# Patient Record
Sex: Male | Born: 1980 | Race: Black or African American | Hispanic: No | Marital: Single | Smoking: Never smoker
Health system: Southern US, Community
[De-identification: ages and names within clinical notes are randomized; demographics above are authoritative.]

## PROBLEM LIST (undated history)

## (undated) DIAGNOSIS — M5126 Other intervertebral disc displacement, lumbar region: Secondary | ICD-10-CM

## (undated) DIAGNOSIS — M170 Bilateral primary osteoarthritis of knee: Secondary | ICD-10-CM

## (undated) DIAGNOSIS — R7303 Prediabetes: Secondary | ICD-10-CM

## (undated) DIAGNOSIS — F32A Depression, unspecified: Secondary | ICD-10-CM

## (undated) DIAGNOSIS — Z91018 Allergy to other foods: Secondary | ICD-10-CM

## (undated) DIAGNOSIS — E669 Obesity, unspecified: Secondary | ICD-10-CM

## (undated) DIAGNOSIS — G4733 Obstructive sleep apnea (adult) (pediatric): Secondary | ICD-10-CM

## (undated) DIAGNOSIS — R5383 Other fatigue: Secondary | ICD-10-CM

## (undated) DIAGNOSIS — I10 Essential (primary) hypertension: Secondary | ICD-10-CM

## (undated) DIAGNOSIS — R0602 Shortness of breath: Secondary | ICD-10-CM

## (undated) DIAGNOSIS — M2550 Pain in unspecified joint: Secondary | ICD-10-CM

## (undated) DIAGNOSIS — M545 Low back pain, unspecified: Secondary | ICD-10-CM

## (undated) DIAGNOSIS — I319 Disease of pericardium, unspecified: Secondary | ICD-10-CM

## (undated) DIAGNOSIS — R079 Chest pain, unspecified: Secondary | ICD-10-CM

## (undated) DIAGNOSIS — M549 Dorsalgia, unspecified: Secondary | ICD-10-CM

## (undated) DIAGNOSIS — K5900 Constipation, unspecified: Secondary | ICD-10-CM

## (undated) HISTORY — DX: Prediabetes: R73.03

## (undated) HISTORY — DX: Depression, unspecified: F32.A

## (undated) HISTORY — DX: Bilateral primary osteoarthritis of knee: M17.0

## (undated) HISTORY — DX: Other intervertebral disc displacement, lumbar region: M51.26

## (undated) HISTORY — DX: Chest pain, unspecified: R07.9

## (undated) HISTORY — DX: Constipation, unspecified: K59.00

## (undated) HISTORY — PX: OTHER SURGICAL HISTORY: SHX169

## (undated) HISTORY — PX: KNEE SURGERY: SHX244

## (undated) HISTORY — DX: Pain in unspecified joint: M25.50

## (undated) HISTORY — DX: Other fatigue: R53.83

## (undated) HISTORY — DX: Allergy to other foods: Z91.018

## (undated) HISTORY — DX: Essential (primary) hypertension: I10

## (undated) HISTORY — DX: Shortness of breath: R06.02

## (undated) HISTORY — DX: Dorsalgia, unspecified: M54.9

---
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
## 1999-01-18 ENCOUNTER — Encounter: Payer: Self-pay | Admitting: Emergency Medicine

## 1999-01-18 ENCOUNTER — Emergency Department (HOSPITAL_COMMUNITY): Admission: EM | Admit: 1999-01-18 | Discharge: 1999-01-18 | Payer: Self-pay | Admitting: Emergency Medicine

## 2000-09-22 ENCOUNTER — Encounter: Admission: RE | Admit: 2000-09-22 | Discharge: 2000-09-22 | Payer: Self-pay | Admitting: Orthopedic Surgery

## 2000-09-22 ENCOUNTER — Encounter: Payer: Self-pay | Admitting: Orthopedic Surgery

## 2002-08-01 ENCOUNTER — Emergency Department (HOSPITAL_COMMUNITY): Admission: EM | Admit: 2002-08-01 | Discharge: 2002-08-01 | Payer: Self-pay | Admitting: *Deleted

## 2002-10-03 ENCOUNTER — Emergency Department (HOSPITAL_COMMUNITY): Admission: EM | Admit: 2002-10-03 | Discharge: 2002-10-03 | Payer: Self-pay | Admitting: Emergency Medicine

## 2004-01-26 ENCOUNTER — Emergency Department (HOSPITAL_COMMUNITY): Admission: EM | Admit: 2004-01-26 | Discharge: 2004-01-26 | Payer: Self-pay | Admitting: Family Medicine

## 2004-06-21 ENCOUNTER — Ambulatory Visit: Payer: Self-pay | Admitting: Internal Medicine

## 2004-07-22 ENCOUNTER — Ambulatory Visit: Payer: Self-pay | Admitting: Internal Medicine

## 2004-07-29 ENCOUNTER — Encounter: Admission: RE | Admit: 2004-07-29 | Discharge: 2004-08-31 | Payer: Self-pay | Admitting: Internal Medicine

## 2004-09-08 ENCOUNTER — Ambulatory Visit: Payer: Self-pay | Admitting: Internal Medicine

## 2004-11-04 ENCOUNTER — Ambulatory Visit (HOSPITAL_COMMUNITY): Admission: RE | Admit: 2004-11-04 | Discharge: 2004-11-04 | Payer: Self-pay | Admitting: Internal Medicine

## 2004-11-04 ENCOUNTER — Ambulatory Visit: Payer: Self-pay | Admitting: Internal Medicine

## 2004-11-11 ENCOUNTER — Ambulatory Visit: Payer: Self-pay | Admitting: Internal Medicine

## 2005-01-28 ENCOUNTER — Emergency Department (HOSPITAL_COMMUNITY): Admission: EM | Admit: 2005-01-28 | Discharge: 2005-01-28 | Payer: Self-pay | Admitting: Emergency Medicine

## 2005-07-14 ENCOUNTER — Encounter: Admission: RE | Admit: 2005-07-14 | Discharge: 2005-07-14 | Payer: Self-pay | Admitting: Occupational Medicine

## 2005-10-09 ENCOUNTER — Emergency Department (HOSPITAL_COMMUNITY): Admission: EM | Admit: 2005-10-09 | Discharge: 2005-10-09 | Payer: Self-pay | Admitting: Emergency Medicine

## 2006-01-02 ENCOUNTER — Emergency Department (HOSPITAL_COMMUNITY): Admission: EM | Admit: 2006-01-02 | Discharge: 2006-01-02 | Payer: Self-pay | Admitting: Family Medicine

## 2006-01-03 ENCOUNTER — Encounter: Admission: RE | Admit: 2006-01-03 | Discharge: 2006-01-03 | Payer: Self-pay | Admitting: Family Medicine

## 2006-01-11 ENCOUNTER — Emergency Department (HOSPITAL_COMMUNITY): Admission: EM | Admit: 2006-01-11 | Discharge: 2006-01-11 | Payer: Self-pay | Admitting: Emergency Medicine

## 2006-02-07 ENCOUNTER — Encounter: Admission: RE | Admit: 2006-02-07 | Discharge: 2006-04-10 | Payer: Self-pay | Admitting: Neurosurgery

## 2006-03-07 ENCOUNTER — Encounter: Admission: RE | Admit: 2006-03-07 | Discharge: 2006-03-07 | Payer: Self-pay | Admitting: Family Medicine

## 2006-05-19 ENCOUNTER — Emergency Department (HOSPITAL_COMMUNITY): Admission: EM | Admit: 2006-05-19 | Discharge: 2006-05-19 | Payer: Self-pay | Admitting: Emergency Medicine

## 2006-06-22 ENCOUNTER — Ambulatory Visit (HOSPITAL_COMMUNITY): Admission: RE | Admit: 2006-06-22 | Discharge: 2006-06-22 | Payer: Self-pay | Admitting: Neurosurgery

## 2007-02-26 ENCOUNTER — Emergency Department (HOSPITAL_COMMUNITY): Admission: EM | Admit: 2007-02-26 | Discharge: 2007-02-26 | Payer: Self-pay | Admitting: Family Medicine

## 2007-10-15 ENCOUNTER — Emergency Department (HOSPITAL_COMMUNITY): Admission: EM | Admit: 2007-10-15 | Discharge: 2007-10-15 | Payer: Self-pay | Admitting: Family Medicine

## 2008-02-12 ENCOUNTER — Emergency Department (HOSPITAL_COMMUNITY): Admission: EM | Admit: 2008-02-12 | Discharge: 2008-02-12 | Payer: Self-pay | Admitting: Emergency Medicine

## 2008-05-09 ENCOUNTER — Encounter: Admission: RE | Admit: 2008-05-09 | Discharge: 2008-05-09 | Payer: Self-pay | Admitting: Family Medicine

## 2008-05-16 ENCOUNTER — Emergency Department (HOSPITAL_COMMUNITY): Admission: EM | Admit: 2008-05-16 | Discharge: 2008-05-16 | Payer: Self-pay | Admitting: Emergency Medicine

## 2008-08-22 ENCOUNTER — Emergency Department (HOSPITAL_COMMUNITY): Admission: EM | Admit: 2008-08-22 | Discharge: 2008-08-22 | Payer: Self-pay | Admitting: Emergency Medicine

## 2008-09-16 ENCOUNTER — Encounter: Admission: RE | Admit: 2008-09-16 | Discharge: 2008-09-16 | Payer: Self-pay | Admitting: Orthopaedic Surgery

## 2009-07-01 ENCOUNTER — Emergency Department (HOSPITAL_COMMUNITY): Admission: EM | Admit: 2009-07-01 | Discharge: 2009-07-01 | Payer: Self-pay | Admitting: Family Medicine

## 2009-09-06 ENCOUNTER — Emergency Department (HOSPITAL_COMMUNITY): Admission: EM | Admit: 2009-09-06 | Discharge: 2009-09-06 | Payer: Self-pay | Admitting: Emergency Medicine

## 2009-09-23 ENCOUNTER — Encounter: Payer: Self-pay | Admitting: Cardiovascular Disease

## 2009-11-24 ENCOUNTER — Emergency Department (HOSPITAL_COMMUNITY): Admission: EM | Admit: 2009-11-24 | Discharge: 2009-11-24 | Payer: Self-pay | Admitting: Family Medicine

## 2010-07-22 ENCOUNTER — Encounter: Admission: RE | Admit: 2010-07-22 | Discharge: 2010-07-22 | Payer: Self-pay | Admitting: Orthopaedic Surgery

## 2010-10-25 ENCOUNTER — Encounter: Payer: Self-pay | Admitting: Family Medicine

## 2011-01-04 LAB — POCT CARDIAC MARKERS
CKMB, poc: 2.2 ng/mL (ref 1.0–8.0)
Myoglobin, poc: 106 ng/mL (ref 12–200)
Troponin i, poc: <0.05 ng/mL (ref 0.00–0.09)

## 2011-01-04 LAB — POCT I-STAT, CHEM 8
BUN: 8 mg/dL (ref 6–23)
Calcium, Ion: 1.22 mmol/L (ref 1.12–1.32)
Chloride: 103 mEq/L (ref 96–112)
Creatinine, Ser: 0.8 mg/dL (ref 0.4–1.5)
Glucose, Bld: 90 mg/dL (ref 70–99)
Hemoglobin: 14.6 g/dL (ref 13.0–17.0)
Potassium: 3.9 mEq/L (ref 3.5–5.1)
Sodium: 141 mEq/L (ref 135–145)

## 2011-01-04 LAB — CBC
HCT: 42 % (ref 39.0–52.0)
Hemoglobin: 13.9 g/dL (ref 13.0–17.0)
MCHC: 33.1 g/dL (ref 30.0–36.0)
MCV: 89.5 fL (ref 78.0–100.0)
RBC: 4.69 MIL/uL (ref 4.22–5.81)
RDW: 13.4 % (ref 11.5–15.5)
WBC: 5.6 10*3/uL (ref 4.0–10.5)

## 2011-01-04 LAB — DIFFERENTIAL
Monocytes Relative: 11 % (ref 3–12)
Neutrophils Relative %: 47 % (ref 43–77)

## 2011-02-25 ENCOUNTER — Inpatient Hospital Stay (INDEPENDENT_AMBULATORY_CARE_PROVIDER_SITE_OTHER)
Admission: RE | Admit: 2011-02-25 | Discharge: 2011-02-25 | Disposition: A | Discharge status: Home / Self Care | Payer: 59 | Source: Ambulatory Visit | Attending: Emergency Medicine | Admitting: Emergency Medicine

## 2011-02-25 DIAGNOSIS — I10 Essential (primary) hypertension: Secondary | ICD-10-CM

## 2011-02-25 DIAGNOSIS — K1120 Sialoadenitis, unspecified: Secondary | ICD-10-CM

## 2011-03-21 ENCOUNTER — Inpatient Hospital Stay (INDEPENDENT_AMBULATORY_CARE_PROVIDER_SITE_OTHER)
Admission: RE | Admit: 2011-03-21 | Discharge: 2011-03-21 | Disposition: A | Discharge status: Home / Self Care | Payer: 59 | Source: Ambulatory Visit | Attending: Family Medicine | Admitting: Family Medicine

## 2011-03-21 DIAGNOSIS — J069 Acute upper respiratory infection, unspecified: Secondary | ICD-10-CM

## 2012-01-24 ENCOUNTER — Encounter: Payer: Self-pay | Admitting: *Deleted

## 2013-05-06 ENCOUNTER — Encounter: Payer: Self-pay | Admitting: *Deleted

## 2013-09-18 ENCOUNTER — Emergency Department (HOSPITAL_COMMUNITY)
Admission: EM | Admit: 2013-09-18 | Discharge: 2013-09-18 | Disposition: A | Discharge status: Home / Self Care | Payer: 59 | Source: Home / Self Care | Attending: Family Medicine | Admitting: Family Medicine

## 2013-09-18 ENCOUNTER — Encounter (HOSPITAL_COMMUNITY): Payer: Self-pay | Admitting: Emergency Medicine

## 2013-09-18 DIAGNOSIS — J111 Influenza due to unidentified influenza virus with other respiratory manifestations: Secondary | ICD-10-CM

## 2013-09-18 DIAGNOSIS — R030 Elevated blood-pressure reading, without diagnosis of hypertension: Secondary | ICD-10-CM

## 2013-09-18 MED ORDER — BENZONATATE 100 MG PO CAPS: 100.0000 mg | ORAL_CAPSULE | Freq: Three times a day (TID) | ORAL | Status: DC | PRN

## 2013-09-18 NOTE — ED Notes (Signed)
C/o nonproductive cough since Saturday States he does have fever, chills, and congestion States ibuprofen and promethazine was taken but no relief.

## 2013-09-18 NOTE — ED Provider Notes (Signed)
CSN: [id number]     Arrival date & time 09/18/13  1848 History   First MD Initiated Contact with Patient 09/18/13 2000     Chief Complaint  Patient presents with  . Cough   (Consider location/radiation/quality/duration/timing/severity/associated sxs/prior Treatment) HPI Comments: Patient states his symptoms developed rapidly on 09/15/2013 and he learned 2 days later that his co-worker had been diagnosed with influenza. Today, his main concern is his persistent cough.   Patient is a 32 y.o. male presenting with URI. The history is provided by the patient.  URI Presenting symptoms: congestion, cough, fatigue, fever and rhinorrhea   Severity:  Moderate Onset quality:  Sudden Duration:  6 days Progression:  Improving Chronicity:  New Associated symptoms: myalgias   Associated symptoms: no arthralgias, no headaches, no neck pain, no sinus pain, no sneezing, no swollen glands and no wheezing     Past Medical History  Diagnosis Date  . Hypertension    Past Surgical History  Procedure Laterality Date  . None     Family History  Problem Relation Age of Onset  . Hypertension    . Diabetes     History  Substance Use Topics  . Smoking status: Never Smoker   . Smokeless tobacco: Not on file  . Alcohol Use: No    Review of Systems  Constitutional: Positive for fever and fatigue.  HENT: Positive for congestion and rhinorrhea. Negative for sneezing.   Respiratory: Positive for cough. Negative for wheezing.   Cardiovascular: Negative.   Gastrointestinal: Negative.   Genitourinary: Negative.   Musculoskeletal: Positive for myalgias. Negative for arthralgias and neck pain.  Skin: Negative for rash.  Allergic/Immunologic: Negative for immunocompromised state.  Neurological: Negative for headaches.  Hematological: Negative for adenopathy.  Psychiatric/Behavioral: Negative.     Allergies  Ibuprofen  Home Medications   Current Outpatient Rx  Name  Route  Sig  Dispense  Refill   . glucosamine-chondroitin 500-400 MG tablet   Oral   Take 1 tablet by mouth 3 (three) times daily.         . Misc Natural Products (GINSENG COMPLEX PO)   Oral   Take by mouth daily.         . Multiple Vitamin (MULTIVITAMIN) tablet   Oral   Take 1 tablet by mouth daily.         Marland Kitchen omeprazole (PRILOSEC) 20 MG capsule   Oral   Take 20 mg by mouth daily.          BP 151/99  Pulse 67  Temp(Src) 97 F (36.1 C) (Oral)  Resp 20  SpO2 99% Physical Exam  Nursing note and vitals reviewed. Constitutional: He is oriented to person, place, and time. He appears well-developed and well-nourished. No distress.  HENT:  Head: Normocephalic and atraumatic.  Right Ear: Hearing, tympanic membrane, external ear and ear canal normal.  Left Ear: Hearing, tympanic membrane, external ear and ear canal normal.  Nose: Nose normal.  Mouth/Throat: Oropharynx is clear and moist.  Eyes: Conjunctivae are normal. Right eye exhibits no discharge. Left eye exhibits no discharge. No scleral icterus.  Neck: Normal range of motion. Neck supple.  Cardiovascular: Normal rate, regular rhythm and normal heart sounds.   Pulmonary/Chest: Effort normal and breath sounds normal. No respiratory distress. He has no wheezes. He has no rales. He exhibits no tenderness.  Abdominal: Soft. There is no tenderness.  Musculoskeletal: Normal range of motion.  Lymphadenopathy:    He has no cervical adenopathy.  Neurological: He is  alert and oriented to person, place, and time.  Skin: Skin is warm and dry. No rash noted.  Psychiatric: He has a normal mood and affect. His behavior is normal.    ED Course  Procedures (including critical care time) Labs Review Labs Reviewed - No data to display Imaging Review No results found.  EKG Interpretation    Date/Time:    Ventricular Rate:    PR Interval:    QRS Duration:   QT Interval:    QTC Calculation:   R Axis:     Text Interpretation:              MDM   Nathaniel Jones was made aware of his elevated blood pressure and I recommended that he make an appointment with his PCP for re-check in 7-10 days. History of current illness suggests that he is recovering from influenza, however patient is past the point in time from when symptoms began to take tamiflu. We also discussed symptomatic care of his URI symptoms at home, trying to avoid oral decongestants that may further elevate his blood pressure. Tessalon prescribed for cough.     [name] [place], [place] 09/19/13 [phone]

## 2013-09-19 NOTE — ED Provider Notes (Signed)
Medical screening examination/treatment/procedure(s) were performed by resident physician or non-physician practitioner and as supervising physician I was immediately available for consultation/collaboration.   Keli Buehner DOUGLAS MD.   Rahshawn Remo D Ferris Fielden, MD 09/19/13 1745 

## 2013-12-09 ENCOUNTER — Other Ambulatory Visit: Payer: Self-pay | Admitting: Family Medicine

## 2013-12-09 DIAGNOSIS — R1084 Generalized abdominal pain: Secondary | ICD-10-CM

## 2013-12-12 ENCOUNTER — Ambulatory Visit
Admission: RE | Admit: 2013-12-12 | Discharge: 2013-12-12 | Disposition: A | Discharge status: Home / Self Care | Payer: 59 | Source: Ambulatory Visit | Attending: Family Medicine | Admitting: Family Medicine

## 2013-12-12 DIAGNOSIS — R1084 Generalized abdominal pain: Secondary | ICD-10-CM

## 2013-12-12 MED ORDER — IOHEXOL 300 MG/ML  SOLN
125.0000 mL | Freq: Once | INTRAMUSCULAR | Status: AC | PRN
  Administered 2013-12-12: 125 mL via INTRAVENOUS

## 2014-01-31 ENCOUNTER — Other Ambulatory Visit: Payer: Self-pay | Admitting: Physician Assistant

## 2014-01-31 DIAGNOSIS — M25511 Pain in right shoulder: Secondary | ICD-10-CM

## 2014-02-10 ENCOUNTER — Ambulatory Visit
Admission: RE | Admit: 2014-02-10 | Discharge: 2014-02-10 | Disposition: A | Discharge status: Home / Self Care | Payer: 59 | Source: Ambulatory Visit | Attending: Physician Assistant | Admitting: Physician Assistant

## 2014-02-10 DIAGNOSIS — M25511 Pain in right shoulder: Secondary | ICD-10-CM

## 2014-02-27 ENCOUNTER — Other Ambulatory Visit: Payer: Self-pay | Admitting: Orthopaedic Surgery

## 2014-02-27 DIAGNOSIS — M25562 Pain in left knee: Secondary | ICD-10-CM

## 2014-03-08 ENCOUNTER — Ambulatory Visit
Admission: RE | Admit: 2014-03-08 | Discharge: 2014-03-08 | Disposition: A | Discharge status: Home / Self Care | Payer: 59 | Source: Ambulatory Visit | Attending: Orthopaedic Surgery | Admitting: Orthopaedic Surgery

## 2014-03-08 DIAGNOSIS — M25562 Pain in left knee: Secondary | ICD-10-CM

## 2014-03-27 ENCOUNTER — Encounter (INDEPENDENT_AMBULATORY_CARE_PROVIDER_SITE_OTHER): Payer: Self-pay

## 2014-03-27 ENCOUNTER — Ambulatory Visit
Admission: RE | Admit: 2014-03-27 | Discharge: 2014-03-27 | Disposition: A | Discharge status: Home / Self Care | Payer: 59 | Source: Ambulatory Visit | Attending: Family Medicine | Admitting: Family Medicine

## 2014-03-27 ENCOUNTER — Other Ambulatory Visit: Payer: Self-pay | Admitting: Family Medicine

## 2014-03-27 DIAGNOSIS — M542 Cervicalgia: Secondary | ICD-10-CM

## 2014-04-14 ENCOUNTER — Other Ambulatory Visit: Payer: Self-pay | Admitting: Orthopaedic Surgery

## 2014-04-14 DIAGNOSIS — M25562 Pain in left knee: Secondary | ICD-10-CM

## 2014-04-19 ENCOUNTER — Ambulatory Visit
Admission: RE | Admit: 2014-04-19 | Discharge: 2014-04-19 | Disposition: A | Discharge status: Home / Self Care | Payer: 59 | Source: Ambulatory Visit | Attending: Orthopaedic Surgery | Admitting: Orthopaedic Surgery

## 2014-04-19 DIAGNOSIS — M25562 Pain in left knee: Secondary | ICD-10-CM

## 2014-10-03 HISTORY — PX: KNEE SURGERY: SHX244

## 2014-11-20 ENCOUNTER — Other Ambulatory Visit: Payer: Self-pay | Admitting: Orthopaedic Surgery

## 2014-11-20 DIAGNOSIS — M25572 Pain in left ankle and joints of left foot: Secondary | ICD-10-CM

## 2014-12-05 ENCOUNTER — Ambulatory Visit
Admission: RE | Admit: 2014-12-05 | Discharge: 2014-12-05 | Disposition: A | Discharge status: Home / Self Care | Payer: Self-pay | Source: Ambulatory Visit | Attending: Orthopaedic Surgery | Admitting: Orthopaedic Surgery

## 2014-12-05 DIAGNOSIS — M25572 Pain in left ankle and joints of left foot: Secondary | ICD-10-CM

## 2015-02-08 ENCOUNTER — Emergency Department (HOSPITAL_COMMUNITY)
Admission: EM | Admit: 2015-02-08 | Discharge: 2015-02-08 | Disposition: A | Discharge status: Home / Self Care | Payer: 59 | Source: Home / Self Care | Attending: Family Medicine | Admitting: Family Medicine

## 2015-02-08 ENCOUNTER — Encounter (HOSPITAL_COMMUNITY): Payer: Self-pay

## 2015-02-08 DIAGNOSIS — S76311A Strain of muscle, fascia and tendon of the posterior muscle group at thigh level, right thigh, initial encounter: Secondary | ICD-10-CM | POA: Diagnosis not present

## 2015-02-08 MED ORDER — TRAMADOL HCL 50 MG PO TABS: 50.0000 mg | ORAL_TABLET | Freq: Four times a day (QID) | ORAL | Status: DC | PRN

## 2015-02-08 NOTE — ED Notes (Signed)
Had sudden painin knee while working out, and since has not been able to have full ROM same

## 2015-02-08 NOTE — Discharge Instructions (Signed)
Heat, activity as tolerated and see your orthopedsit as possible

## 2015-02-08 NOTE — ED Provider Notes (Signed)
CSN: [id number]     Arrival date & time 02/08/15  1846 History   First MD Initiated Contact with Patient 02/08/15 1852     No chief complaint on file.  (Consider location/radiation/quality/duration/timing/severity/associated sxs/prior Treatment) Patient is a 34 y.o. male presenting with leg pain. The history is provided by the patient.  Leg Pain Location:  Knee Time since incident:  1 week Injury: yes   Mechanism of injury comment:  Doing leg press exercise and felt pop and pain up right lat thigh Knee location:  R knee Pain details:    Quality:  Sharp   Severity:  Mild   Onset quality:  Sudden   Progression:  Unchanged Chronicity:  New Dislocation: no   Prior injury to area:  No Associated symptoms: decreased ROM and muscle weakness   Associated symptoms: no back pain, no fever, no numbness, no stiffness and no swelling     Past Medical History  Diagnosis Date  . Hypertension    Past Surgical History  Procedure Laterality Date  . None     Family History  Problem Relation Age of Onset  . Hypertension    . Diabetes     History  Substance Use Topics  . Smoking status: Never Smoker   . Smokeless tobacco: Not on file  . Alcohol Use: No    Review of Systems  Constitutional: Negative.  Negative for fever.  Musculoskeletal: Positive for myalgias. Negative for back pain, joint swelling, gait problem and stiffness.  Skin: Negative.     Allergies  Ibuprofen  Home Medications   Prior to Admission medications   Medication Sig Start Date End Date Taking? Authorizing Provider  benzonatate (TESSALON) 100 MG capsule Take 1 capsule (100 mg total) by mouth 3 (three) times daily as needed for cough. 09/18/13   Mathis FareJennifer Lee H Presson, PA  glucosamine-chondroitin 500-400 MG tablet Take 1 tablet by mouth 3 (three) times daily.    Historical Provider, MD  Misc Natural Products (GINSENG COMPLEX PO) Take by mouth daily.    Historical Provider, MD  Multiple Vitamin (MULTIVITAMIN)  tablet Take 1 tablet by mouth daily.    Historical Provider, MD  omeprazole (PRILOSEC) 20 MG capsule Take 20 mg by mouth daily.    Historical Provider, MD  traMADol (ULTRAM) 50 MG tablet Take 1 tablet (50 mg total) by mouth every 6 (six) hours as needed. 02/08/15   Linna HoffJames D Treysen Sudbeck, MD   BP 138/90 mmHg  Pulse 66  Temp(Src) 98 F (36.7 C) (Oral)  Resp 18  SpO2 100% Physical Exam  Constitutional: He is oriented to person, place, and time. He appears well-developed and well-nourished.  Musculoskeletal: He exhibits tenderness.       Legs: Neurological: He is alert and oriented to person, place, and time.  Skin: Skin is warm and dry.  Nursing note and vitals reviewed.   ED Course  Procedures (including critical care time) Labs Review Labs Reviewed - No data to display  Imaging Review No results found.   MDM   1. Hamstring muscle strain, right, initial encounter        Linna HoffJames D Ripley Bogosian, MD 02/08/15 1910

## 2015-04-10 ENCOUNTER — Other Ambulatory Visit: Payer: Self-pay | Admitting: Physician Assistant

## 2015-04-10 DIAGNOSIS — M25561 Pain in right knee: Secondary | ICD-10-CM

## 2015-04-28 ENCOUNTER — Ambulatory Visit
Admission: RE | Admit: 2015-04-28 | Discharge: 2015-04-28 | Disposition: A | Discharge status: Home / Self Care | Payer: 59 | Source: Ambulatory Visit | Attending: Physician Assistant | Admitting: Physician Assistant

## 2015-04-28 DIAGNOSIS — M25561 Pain in right knee: Secondary | ICD-10-CM

## 2015-05-26 ENCOUNTER — Other Ambulatory Visit: Payer: Self-pay | Admitting: Family Medicine

## 2015-05-26 ENCOUNTER — Ambulatory Visit
Admission: RE | Admit: 2015-05-26 | Discharge: 2015-05-26 | Disposition: A | Discharge status: Home / Self Care | Payer: 59 | Source: Ambulatory Visit | Attending: Family Medicine | Admitting: Family Medicine

## 2015-05-26 DIAGNOSIS — L989 Disorder of the skin and subcutaneous tissue, unspecified: Secondary | ICD-10-CM

## 2015-05-29 ENCOUNTER — Other Ambulatory Visit: Payer: Self-pay | Admitting: Family Medicine

## 2015-05-29 DIAGNOSIS — L989 Disorder of the skin and subcutaneous tissue, unspecified: Secondary | ICD-10-CM

## 2015-06-02 ENCOUNTER — Ambulatory Visit
Admission: RE | Admit: 2015-06-02 | Discharge: 2015-06-02 | Disposition: A | Discharge status: Home / Self Care | Payer: 59 | Source: Ambulatory Visit | Attending: Family Medicine | Admitting: Family Medicine

## 2015-06-02 DIAGNOSIS — L989 Disorder of the skin and subcutaneous tissue, unspecified: Secondary | ICD-10-CM

## 2016-03-14 ENCOUNTER — Other Ambulatory Visit: Payer: Self-pay | Admitting: Orthopaedic Surgery

## 2016-03-14 DIAGNOSIS — M545 Low back pain: Secondary | ICD-10-CM

## 2016-03-19 ENCOUNTER — Ambulatory Visit (HOSPITAL_COMMUNITY)
Admission: EM | Admit: 2016-03-19 | Discharge: 2016-03-19 | Disposition: A | Discharge status: Home / Self Care | Payer: 59 | Attending: Family Medicine | Admitting: Family Medicine

## 2016-03-19 ENCOUNTER — Encounter (HOSPITAL_COMMUNITY): Payer: Self-pay | Admitting: *Deleted

## 2016-03-19 DIAGNOSIS — M79602 Pain in left arm: Secondary | ICD-10-CM | POA: Diagnosis not present

## 2016-03-19 DIAGNOSIS — M79605 Pain in left leg: Secondary | ICD-10-CM

## 2016-03-19 MED ORDER — HYDROCODONE-ACETAMINOPHEN 5-325 MG PO TABS: 1.0000 | ORAL_TABLET | ORAL | Status: DC | PRN

## 2016-03-19 NOTE — ED Notes (Signed)
Pt  Reports  Pain  Behind  l  Knee   As   Well  As  l  Shin  For  Several days  - he  Has  Been  Seeing  An orthopedist     For        The  Problem which  He  Reports  Included numbness  Down l  Leg  And  Was  Told  It  Was  Some  Type  Of  Back  Or  Knee  Problems   He  States  He  Is  Scheduled  For  An mri  In  6  Days

## 2016-03-19 NOTE — ED Provider Notes (Signed)
CSN: [id number]     Arrival date & time 03/19/16  1822 History   None    Chief Complaint  Patient presents with  . Leg Pain   (Consider location/radiation/quality/duration/timing/severity/associated sxs/prior Treatment) Patient is a 35 y.o. male presenting with leg pain. The history is provided by the patient.  Leg Pain Location:  Leg and knee Injury: no   Leg location:  L upper leg Knee location:  L knee Pain details:    Quality:  Burning and shooting   Progression:  Unchanged Chronicity:  New (seen by dr Magnus Ivan ortho and mri planned next week, still having pain.) Dislocation: no   Prior injury to area:  No Associated symptoms: back pain, numbness and tingling     Past Medical History  Diagnosis Date  . Hypertension    Past Surgical History  Procedure Laterality Date  . None     Family History  Problem Relation Age of Onset  . Hypertension    . Diabetes     Social History  Substance Use Topics  . Smoking status: Never Smoker   . Smokeless tobacco: None  . Alcohol Use: No    Review of Systems  Constitutional: Negative.   Gastrointestinal: Negative.   Musculoskeletal: Positive for back pain. Negative for myalgias.  Skin: Negative.   All other systems reviewed and are negative.   Allergies  Ibuprofen  Home Medications   Prior to Admission medications   Medication Sig Start Date End Date Taking? Authorizing Provider  benzonatate (TESSALON) 100 MG capsule Take 1 capsule (100 mg total) by mouth 3 (three) times daily as needed for cough. 09/18/13   Mathis Fare Presson, PA  glucosamine-chondroitin 500-400 MG tablet Take 1 tablet by mouth 3 (three) times daily.    Historical Provider, MD  HYDROcodone-acetaminophen (NORCO/VICODIN) 5-325 MG tablet Take 1 tablet by mouth every 4 (four) hours as needed for moderate pain. 03/19/16   Linna Hoff, MD  Misc Natural Products [facility] COMPLEX PO) Take by mouth daily.    Historical Provider, MD  Multiple Vitamin  (MULTIVITAMIN) tablet Take 1 tablet by mouth daily.    Historical Provider, MD  omeprazole (PRILOSEC) 20 MG capsule Take 20 mg by mouth daily.    Historical Provider, MD  traMADol (ULTRAM) 50 MG tablet Take 1 tablet (50 mg total) by mouth every 6 (six) hours as needed. 02/08/15   Linna Hoff, MD   Meds Ordered and Administered this Visit  Medications - No data to display  BP 132/79 mmHg  Pulse 66  Temp(Src) 98.1 F (36.7 C) (Oral)  SpO2 100% No data found.   Physical Exam  Constitutional: He is oriented to person, place, and time. He appears well-developed and well-nourished.  Musculoskeletal: Normal range of motion. He exhibits tenderness. He exhibits no edema.       Left knee: He exhibits normal range of motion, no swelling, no effusion, no deformity, no erythema, no LCL laxity and normal patellar mobility. No medial joint line, no lateral joint line and no patellar tendon tenderness noted.       Legs: Neurological: He is alert and oriented to person, place, and time.  Skin: Skin is warm and dry.  Nursing note and vitals reviewed.   ED Course  Procedures (including critical care time)  Labs Review Labs Reviewed - No data to display  Imaging Review No results found.   Visual Acuity Review  Right Eye Distance:   Left Eye Distance:   Bilateral Distance:  Right Eye Near:   Left Eye Near:    Bilateral Near:         MDM   1. Leg pain, diffuse, left       Linna HoffJames D Luz Burcher, MD 03/19/16 2000

## 2016-03-19 NOTE — Discharge Instructions (Signed)
Contact your ortho on mon if needed.

## 2016-03-25 ENCOUNTER — Ambulatory Visit
Admission: RE | Admit: 2016-03-25 | Discharge: 2016-03-25 | Disposition: A | Discharge status: Home / Self Care | Payer: 59 | Source: Ambulatory Visit | Attending: Orthopaedic Surgery | Admitting: Orthopaedic Surgery

## 2016-03-25 DIAGNOSIS — M545 Low back pain: Secondary | ICD-10-CM

## 2016-06-03 ENCOUNTER — Other Ambulatory Visit: Payer: Self-pay | Admitting: Physician Assistant

## 2016-06-03 DIAGNOSIS — M25562 Pain in left knee: Secondary | ICD-10-CM

## 2016-06-16 ENCOUNTER — Ambulatory Visit
Admission: RE | Admit: 2016-06-16 | Discharge: 2016-06-16 | Disposition: A | Discharge status: Home / Self Care | Payer: 59 | Source: Ambulatory Visit | Attending: Physician Assistant | Admitting: Physician Assistant

## 2016-06-16 DIAGNOSIS — M25562 Pain in left knee: Secondary | ICD-10-CM

## 2016-06-24 ENCOUNTER — Encounter (HOSPITAL_COMMUNITY): Payer: Self-pay | Admitting: *Deleted

## 2016-06-24 ENCOUNTER — Ambulatory Visit (HOSPITAL_COMMUNITY)
Admission: EM | Admit: 2016-06-24 | Discharge: 2016-06-24 | Disposition: A | Discharge status: Home / Self Care | Payer: 59 | Attending: Internal Medicine | Admitting: Internal Medicine

## 2016-06-24 DIAGNOSIS — R05 Cough: Secondary | ICD-10-CM

## 2016-06-24 DIAGNOSIS — R059 Cough, unspecified: Secondary | ICD-10-CM

## 2016-06-24 DIAGNOSIS — J209 Acute bronchitis, unspecified: Secondary | ICD-10-CM

## 2016-06-24 NOTE — ED Triage Notes (Signed)
Pt  Reports   Symptoms  Of  Cough   Congested       Has  Been  Seen  Several  tiimes  For  resp   Complaints  To  Include  Cough   And congested       aith  Symptoms  Of  Eye     Irritation  As  Well     He  Has  Taken  And  uis  Still  Taking  Anti biotics he has  Had  steriod injection and  A  steriod  Pack  As  Well  As  An inahler     He    Reports      Still   Has  Some  Shortness  Of  Breath

## 2016-06-24 NOTE — Discharge Instructions (Signed)
You are recovering from a diagnosis of Bronchitis. You should continue with your current treatment regimens and give yourself some time to recover. If you feel that you are worsening to an emergent status then please don't hesitate to go to the ER. If the breathing is not improving in another 1-2 weeks then schedule a f/u with Dr. Clovis RileyMitchell.

## 2016-06-24 NOTE — ED Provider Notes (Signed)
CSN: [id number]     Arrival date & time 06/24/16  1920 History   None    Chief Complaint  Patient presents with  . Cough   (Consider location/radiation/quality/duration/timing/severity/associated sxs/prior Treatment) Otherwise healthy patient presents following a diagnosis of bronchitis 2 weeks ago with ongoing feeling of "cant take a good breath". He has been to an urgent care x 3. He is currently on his 2nd round of antibiotics, 2nd round of prednisone pack and is using an inhaler. His cough and congestion are better. He has no fevers. No chest pain or diaphoresis.       Past Medical History:  Diagnosis Date  . Hypertension    Past Surgical History:  Procedure Laterality Date  . none     Family History  Problem Relation Age of Onset  . Hypertension    . Diabetes     Social History  Substance Use Topics  . Smoking status: Never Smoker  . Smokeless tobacco: Not on file  . Alcohol use No    Review of Systems  Constitutional: Positive for fatigue. Negative for chills, fever and unexpected weight change.  HENT: Negative.   Respiratory: Positive for cough and shortness of breath. Negative for wheezing.   Cardiovascular: Negative for chest pain and leg swelling.  Skin: Negative.     Allergies  Ibuprofen  Home Medications   Prior to Admission medications   Medication Sig Start Date End Date Taking? Authorizing Provider  Sulfamethoxazole-Trimethoprim (BACTRIM DS PO) Take by mouth.   Yes Historical Provider, MD  benzonatate (TESSALON) 100 MG capsule Take 1 capsule (100 mg total) by mouth 3 (three) times daily as needed for cough. 09/18/13   Mathis Fare Presson, PA  glucosamine-chondroitin 500-400 MG tablet Take 1 tablet by mouth 3 (three) times daily.    Historical Provider, MD  HYDROcodone-acetaminophen (NORCO/VICODIN) 5-325 MG tablet Take 1 tablet by mouth every 4 (four) hours as needed for moderate pain. 03/19/16   Linna Hoff, MD  Misc Natural Products [facility]  COMPLEX PO) Take by mouth daily.    Historical Provider, MD  Multiple Vitamin (MULTIVITAMIN) tablet Take 1 tablet by mouth daily.    Historical Provider, MD  omeprazole (PRILOSEC) 20 MG capsule Take 20 mg by mouth daily.    Historical Provider, MD  traMADol (ULTRAM) 50 MG tablet Take 1 tablet (50 mg total) by mouth every 6 (six) hours as needed. 02/08/15   Linna Hoff, MD   Meds Ordered and Administered this Visit  Medications - No data to display  BP 143/90 (BP Location: Left Arm)   Pulse 74   Temp 98.3 F (36.8 C) (Oral)   Resp 16   SpO2 100%  No data found.   Physical Exam  Constitutional: He is oriented to person, place, and time. He appears well-developed and well-nourished. No distress.  Comfortable and in no acute distress. No use of accessory muscles to breathe and talking in complete sentences  Neck: Normal range of motion.  Cardiovascular: Normal rate and regular rhythm.   Pulmonary/Chest: Effort normal.  Left lower base with fine rhonchi and fine exp wheeze  Lymphadenopathy:    He has no cervical adenopathy.  Neurological: He is alert and oriented to person, place, and time.  Skin: Skin is warm and dry. He is not diaphoretic.  Psychiatric: His behavior is normal.  Nursing note and vitals reviewed.   Urgent Care Course   Clinical Course    Procedures (including critical care time)  Labs  Review Labs Reviewed - No data to display  Imaging Review No results found.   Visual Acuity Review  Right Eye Distance:   Left Eye Distance:   Bilateral Distance:    Right Eye Near:   Left Eye Near:    Bilateral Near:         MDM   1. Cough   2. Acute bronchitis, unspecified organism    Patient with recovering reactive airway from bronchitis. Peak flow >600. O2 100%. Taking prednisone, antibiotics and inhaler. No tachycardia. Reassurance given that reactive airway can take up to 6 weeks for improvement. He is stable for discharge and should give additional  time. If at any time he worsens and becomes emergent then go to the ED. Otherwise suggest f/u with PCP for further work up if warranted.     Riki SheerMichelle G Young, PA-C 06/24/16 2108

## 2016-08-01 ENCOUNTER — Ambulatory Visit (INDEPENDENT_AMBULATORY_CARE_PROVIDER_SITE_OTHER): Payer: 59 | Admitting: Physician Assistant

## 2016-08-01 ENCOUNTER — Encounter (INDEPENDENT_AMBULATORY_CARE_PROVIDER_SITE_OTHER): Payer: Self-pay | Admitting: Physician Assistant

## 2016-08-01 VITALS — Ht 76.0 in | Wt 308.0 lb

## 2016-08-01 DIAGNOSIS — M25562 Pain in left knee: Secondary | ICD-10-CM

## 2016-08-01 DIAGNOSIS — M545 Low back pain: Secondary | ICD-10-CM | POA: Diagnosis not present

## 2016-08-01 DIAGNOSIS — G8929 Other chronic pain: Secondary | ICD-10-CM

## 2016-08-01 MED ORDER — HYALURONAN 88 MG/4ML IX SOSY
88.0000 mg | PREFILLED_SYRINGE | INTRA_ARTICULAR | Status: AC | PRN
  Administered 2016-08-01: 88 mg via INTRA_ARTICULAR

## 2016-08-01 NOTE — Progress Notes (Signed)
   Procedure Note  Patient: Nathaniel Jones             Date of Birth: 12/22/1980           MRN: [id number]             Visit Date: 08/01/2016  Procedures: Visit Diagnoses: Chronic pain of left knee - Plan: Large Joint Injection/Arthrocentesis  Chronic bilateral low back pain without sciatica   Plan: He will ice the knee today. Return to normal activities as tolerated regards to knee. Regards to his back, and follow with Dr. Alvester MorinNewton for epidural steroid injection.  Large Joint Inj Date/Time: 08/01/2016 10:02 AM Performed by: Deneise LeverWOOD, HEATHER Authorized by: Kirtland BouchardLARK, Ashtan Girtman W   Indications:  Pain Location:  Knee Site:  L knee Needle Size:  22 G Approach:  Anterolateral Ultrasound Guidance: No   Fluoroscopic Guidance: No   Arthrogram: No Medications:  88 mg Hyaluronan 88 MG/4ML Aspiration Attempted: No     Patient comes in today for Monovisc injection in left knee. Patient was approved for injection and injection came through specialty pharmacy. He also is complaining of low back pain without radicular symptoms. 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 He was seen by Dr. Alvester MorinNewton underwent an epidural steroid injection and did well until recently he's had no new injury. As having no radicular symptoms down either leg. He reports that the muscle relaxants and anti-inflammatories given by his primary care physicians does not help with his low back pain.  Back Exam   Tenderness  The patient is experiencing tenderness in the lumbar.  Range of Motion  Flexion:               Abnormal Extension:                Lateral Bend Left:    Lateral Bend Right:  Rotation Right:         Rotation Left:           SLR   Right: Negative Left:    Negative  Comments:  Patient has severe pain of low back with flexion of the lumbar spine. He's unable to touch his toes, within 4  inches.

## 2016-08-02 ENCOUNTER — Other Ambulatory Visit (INDEPENDENT_AMBULATORY_CARE_PROVIDER_SITE_OTHER): Payer: Self-pay

## 2016-08-02 DIAGNOSIS — M5416 Radiculopathy, lumbar region: Secondary | ICD-10-CM

## 2016-08-17 ENCOUNTER — Encounter (INDEPENDENT_AMBULATORY_CARE_PROVIDER_SITE_OTHER): Payer: 59 | Admitting: Physical Medicine and Rehabilitation

## 2016-08-29 ENCOUNTER — Ambulatory Visit (INDEPENDENT_AMBULATORY_CARE_PROVIDER_SITE_OTHER): Payer: 59 | Admitting: Physician Assistant

## 2016-09-05 ENCOUNTER — Encounter (INDEPENDENT_AMBULATORY_CARE_PROVIDER_SITE_OTHER): Payer: 59 | Admitting: Physical Medicine and Rehabilitation

## 2016-09-05 ENCOUNTER — Telehealth (INDEPENDENT_AMBULATORY_CARE_PROVIDER_SITE_OTHER): Payer: Self-pay | Admitting: Physical Medicine and Rehabilitation

## 2016-09-05 NOTE — Telephone Encounter (Signed)
Patient has not returned call as of appointment time. Will wait to see if patient comes for appointment.

## 2016-09-05 NOTE — Telephone Encounter (Signed)
I called the patient and left a message for him to call back.

## 2016-09-05 NOTE — Telephone Encounter (Signed)
If no fever and really just a upper respiratory infection or virus then okay to do the injection. If they thought he had the flu then I would probably hold off on the injection. If he is having vomiting or diarrhea I would hold off on the injection.

## 2016-09-07 ENCOUNTER — Other Ambulatory Visit (INDEPENDENT_AMBULATORY_CARE_PROVIDER_SITE_OTHER): Payer: Self-pay | Admitting: Orthopaedic Surgery

## 2016-09-07 NOTE — Telephone Encounter (Signed)
Please advise 

## 2016-09-27 ENCOUNTER — Ambulatory Visit (HOSPITAL_COMMUNITY)
Admission: EM | Admit: 2016-09-27 | Discharge: 2016-09-27 | Disposition: A | Discharge status: Home / Self Care | Payer: 59 | Attending: Family Medicine | Admitting: Family Medicine

## 2016-09-27 ENCOUNTER — Encounter (HOSPITAL_COMMUNITY): Payer: Self-pay | Admitting: Emergency Medicine

## 2016-09-27 DIAGNOSIS — R1032 Left lower quadrant pain: Secondary | ICD-10-CM

## 2016-09-27 DIAGNOSIS — R252 Cramp and spasm: Secondary | ICD-10-CM

## 2016-09-27 LAB — GLUCOSE, CAPILLARY: Glucose-Capillary: 106 mg/dL — ABNORMAL HIGH (ref 65–99)

## 2016-09-27 LAB — POCT URINALYSIS DIP (DEVICE)
Bilirubin Urine: NEGATIVE
Glucose, UA: NEGATIVE mg/dL
Hgb urine dipstick: NEGATIVE
LEUKOCYTES UA: NEGATIVE
NITRITE: NEGATIVE
Protein, ur: NEGATIVE mg/dL
SPECIFIC GRAVITY, URINE: 1.02 (ref 1.005–1.030)
Urobilinogen, UA: 0.2 mg/dL (ref 0.0–1.0)
pH: 6.5 (ref 5.0–8.0)

## 2016-09-27 MED ORDER — METHYLPREDNISOLONE SODIUM SUCC 125 MG IJ SOLR
INTRAMUSCULAR | Status: AC
  Filled 2016-09-27: qty 2

## 2016-09-27 MED ORDER — METHYLPREDNISOLONE SODIUM SUCC 125 MG IJ SOLR
125.0000 mg | Freq: Once | INTRAMUSCULAR | Status: AC
  Administered 2016-09-27: 125 mg via INTRAVENOUS

## 2016-09-27 NOTE — ED Triage Notes (Signed)
[facility] left side abdominal pain that radiates into his back. The patient reported that the pain is worse with movement towards his right side. The patient also complained of a stiff neck. The patient stated that this all started 6 days ago. The patient denied any known injury to the areas.

## 2016-09-27 NOTE — Discharge Instructions (Signed)
May need to follow up with ortho if sx are not better in 1 week See your pcp if sx get worse We gave a steroid shot to see if this helps. Reviewed over test results it does not show any infection or blood in urine may need to see urology to r/o kidney stones Stay hydrated while working out

## 2016-09-27 NOTE — ED Provider Notes (Signed)
CSN: [id number]     Arrival date & time 09/27/16  1321 History   First MD Initiated Contact with Patient 09/27/16 1425     Chief Complaint  Patient presents with  . Abdominal Pain  . Torticollis   (Consider location/radiation/quality/duration/timing/severity/associated sxs/prior Treatment) Pt here for LT side flank pain that radiates to LLQ abd. States that he has had this for 3 days, generalized not feeling well diaphoretic at times. Denies any fevers. Does state that he drinks a lot of water due to him working out and has frequency. Denies any burning on urination no discharge. Did discuss that pt was a pre diabetic on last health screening. Denies any hx of kidney stones.       Past Medical History:  Diagnosis Date  . Hypertension    Past Surgical History:  Procedure Laterality Date  . none     Family History  Problem Relation Age of Onset  . Hypertension    . Diabetes     Social History  Substance Use Topics  . Smoking status: Never Smoker  . Smokeless tobacco: Not on file  . Alcohol use No    Review of Systems  Constitutional: Positive for diaphoresis.  Respiratory: Negative.   Cardiovascular: Negative.   Gastrointestinal: Positive for abdominal pain and nausea.  Endocrine: Positive for polyuria.  Genitourinary: Positive for frequency and urgency.  Skin: Negative.   Neurological: Negative.     Allergies  Ibuprofen  Home Medications   Prior to Admission medications   Medication Sig Start Date End Date Taking? Authorizing Provider  cyclobenzaprine (FLEXERIL) 10 MG tablet  07/18/16  Yes Historical Provider, MD  diclofenac (VOLTAREN) 75 MG EC tablet  07/18/16  Yes Historical Provider, MD  gabapentin (NEURONTIN) 300 MG capsule TAKE 1 CAPSULE BY MOUTH DAILY AT BEDTIME FOR NERVE PAIN 09/07/16  Yes Kathryne Hitchhristopher Y Blackman, MD   Meds Ordered and Administered this Visit   Medications  methylPREDNISolone sodium succinate (SOLU-MEDROL) 125 mg/2 mL injection 125 mg  (not administered)    Pulse 65   Temp 98.2 F (36.8 C) (Oral)   Resp 18   SpO2 100%  No data found.   Physical Exam  Constitutional: He is oriented to person, place, and time. He appears well-developed and well-nourished.  Cardiovascular: Normal rate and regular rhythm.   Pulmonary/Chest: Effort normal and breath sounds normal.  Abdominal: Soft. There is tenderness. There is guarding.  CV tenderness's, radiates to LLQ abd, denies a blood in urine, no discharge,   Genitourinary:  Genitourinary Comments: Denies any sx   Musculoskeletal: Normal range of motion.  Denies any lumbar tenderness, does have a sx of sciatica denies any sx   Neurological: He is alert and oriented to person, place, and time.  Skin: Skin is warm. Capillary refill takes less than 2 seconds.    Urgent Care Course   Clinical Course     Procedures (including critical care time)  Labs Review Labs Reviewed  GLUCOSE, CAPILLARY - Abnormal; Notable for the following:       Result Value   Glucose-Capillary 106 (*)    All other components within normal limits  POCT URINALYSIS DIP (DEVICE) - Abnormal; Notable for the following:    Ketones, ur TRACE (*)    All other components within normal limits    Imaging Review No results found.            MDM   1. Left lower quadrant pain   2. Muscle cramp  May need to follow up with ortho if sx are not better in 1 week See your pcp if sx get worse We gave a steroid shot to see if this helps. Reviewed over test results it does not show any infection or blood in urine may need to see urology to r/o kidney stones Stay hydrated while working out  Reviewed old chart and MRI results     Tobi BastosMelanie A Chellsea Beckers, NP 09/27/16 1515    Tobi BastosMelanie A Emmilia Sowder, NP 09/27/16 1516

## 2016-10-04 DIAGNOSIS — R7309 Other abnormal glucose: Secondary | ICD-10-CM | POA: Diagnosis not present

## 2016-10-04 DIAGNOSIS — M545 Low back pain: Secondary | ICD-10-CM | POA: Diagnosis not present

## 2016-10-17 ENCOUNTER — Ambulatory Visit (INDEPENDENT_AMBULATORY_CARE_PROVIDER_SITE_OTHER): Payer: 59 | Admitting: Physician Assistant

## 2016-10-17 ENCOUNTER — Encounter (INDEPENDENT_AMBULATORY_CARE_PROVIDER_SITE_OTHER): Payer: Self-pay | Admitting: Physician Assistant

## 2016-10-17 ENCOUNTER — Ambulatory Visit (INDEPENDENT_AMBULATORY_CARE_PROVIDER_SITE_OTHER): Payer: 59

## 2016-10-17 DIAGNOSIS — R0781 Pleurodynia: Secondary | ICD-10-CM | POA: Diagnosis not present

## 2016-10-17 NOTE — Progress Notes (Signed)
Office Visit Note   Patient: Nathaniel Jones           Date of Birth: 05/07/1981           MRN: [id number] Visit Date: 10/17/2016              Requested by: Nathaniel Carneyean Mitchell, MD [address] [place], [place] [address] PCP: Nathaniel Carneyean Mitchell, MD   Assessment & Plan: Visit Diagnoses:  1. Rib pain on left side     Plan:Recommend he follow-up with his primary care physician Nathaniel Jones for better evaluation of his abdominal pain and possible imaging of his abdomen. I explained to him that I do not feel this is coming from his back as he is having no radicular symptoms as he was passed with his back. He agrees he is not having the same symptoms. See him back on a when necessary basis.We will Contact Nathaniel Jones's office and make them aware that he needs follow-up with their office  Follow-Up Instructions: Return if symptoms worsen or fail to improve.   Orders:  Orders Placed This Encounter  Procedures  . XR Ribs Unilateral Left   No orders of the defined types were placed in this encounter.     Procedures: No procedures performed   Clinical Data: No additional findings.   Subjective: Chief Complaint  Patient presents with  . Lower Back - Pain  . Left Hip - Pain    HPI Mr. Nathaniel Jones well known to Dr. Magnus IvanBlackman services comes in today with the pain from his lower ribs down his pelvic region. Sudden onset 09/22/2016 went to urgent care. He states there was some concern that this may be due to kidney stones but after workup he was told that that was not kidney stone. He got an IM steroid injection which helped for about 2 hours and his pain returned and worsened. He saw his primary care physician is given a [place] here to evaluate for his low back area as the source of his pain. He denies any radicular symptoms down either leg. He states that his left ribs are very tender to the touch. He does remember playing basketball to the onset of pain states he might've got  checked by someone while playing. He denies any reflux or heartburn. Review of Systems   Objective: Vital Signs: There were no vitals taken for this visit.  Physical Exam  Constitutional: He is oriented to person, place, and time. He appears well-developed and well-nourished. No distress.  Pulmonary/Chest: Effort normal.  Neurological: He is alert and oriented to person, place, and time.  Psychiatric: He has a normal mood and affect.  Abdomen soft with tenderness left upper quadrant and epigastric region.  Ortho Exam No tenderness over the lumbar region paraspinous region. Negative straight leg raise bilaterally. 5 out of 5 strengths lower extremities against resistance bilaterally. Specialty Comments:  No specialty comments available.  Imaging: Xr Ribs Unilateral Left  Result Date: 10/17/2016 Left lower rib films: No rib fractures seen. Lungs appear clear, no atelectasis. Splenic flexure and descending colon full of stool.    PMFS History: There are no active problems to display for this patient.  Past Medical History:  Diagnosis Date  . Hypertension     Family History  Problem Relation Age of Onset  . Hypertension    . Diabetes      Past Surgical History:  Procedure Laterality Date  . 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 Smoking status: Never Smoker  . Smokeless tobacco: Not on file  . Alcohol use No  . Drug use: Unknown  . Sexual activity: Not on file

## 2016-11-07 ENCOUNTER — Ambulatory Visit (INDEPENDENT_AMBULATORY_CARE_PROVIDER_SITE_OTHER): Payer: 59 | Admitting: Physician Assistant

## 2016-11-07 ENCOUNTER — Encounter (INDEPENDENT_AMBULATORY_CARE_PROVIDER_SITE_OTHER): Payer: Self-pay | Admitting: Physician Assistant

## 2016-11-07 DIAGNOSIS — M25561 Pain in right knee: Secondary | ICD-10-CM

## 2016-11-07 MED ORDER — METHYLPREDNISOLONE ACETATE 40 MG/ML IJ SUSP
40.0000 mg | INTRAMUSCULAR | Status: AC | PRN
  Administered 2016-11-07: 40 mg via INTRA_ARTICULAR

## 2016-11-07 MED ORDER — LIDOCAINE HCL 1 % IJ SOLN
3.0000 mL | INTRAMUSCULAR | Status: AC | PRN
  Administered 2016-11-07: 3 mL

## 2016-11-07 NOTE — Progress Notes (Signed)
   Office Visit Note   Patient: Nathaniel Jones           Date of Birth: 08/06/1981           MRN: [id number] Visit Date: 11/07/2016              Requested by: L.Lupe Carneyean Mitchell, MD [address] [place], [place] [address] PCP: Lupe Carneyean Mitchell, MD   Assessment & Plan: Visit Diagnoses:  1. Acute pain of right knee     Plan: He'll return for follow-up in 2 weeks check his progress lack of. Activities as tolerated.  Follow-Up Instructions: Return in about 2 weeks (around 11/21/2016).   Orders:  Orders Placed This Encounter  Procedures  . Large Joint Injection/Arthrocentesis   No orders of the defined types were placed in this encounter.     Procedures: Large Joint Inj Date/Time: 11/07/2016 8:39 AM Performed by: Kirtland BouchardLARK, Paublo Warshawsky W Authorized by: Kirtland BouchardLARK, Cylis Ayars W   Consent Given by:  Patient Indications:  Pain Location:  Knee Site:  R knee Needle Size:  22 G Approach:  Anterolateral Ultrasound Guidance: No   Fluoroscopic Guidance: No   Medications:  3 mL lidocaine 1 %; 40 mg methylPREDNISolone acetate 40 MG/ML Aspiration Attempted: Yes   Aspirate amount (mL):  27 Aspirate:  Yellow Patient tolerance:  Patient tolerated the procedure well with no immediate complications      Clinical Data: No additional findings.   Subjective: Chief Complaint  Patient presents with  . Follow-up    HPI Comes in today with just over a week of right knee pain. Again he has a history of right knee arthroscopy in 09/03/2015 in which he underwent a partial medial meniscectomy and debridement of grade 3 and 4 changes medial femoral condyle. Doing well until he did some expresses he states it wasn't heavy weight.. He states that he was found to have a: Obstruction displaced on some laxatives and other medications and that his rib and abdomen pain is resolved. Review of Systems See HPI  Objective: Vital Signs: There were no vitals taken for this visit.  Physical Exam    Constitutional: He is oriented to person, place, and time. He appears well-developed and well-nourished. No distress.  Neurological: He is alert and oriented to person, place, and time.  Skin: Skin is warm and dry.  Psychiatric: He has a normal mood and affect. His behavior is normal.    Ortho Exam Right knee has full extension and flexion beyond 90. Tenderness along the medial joint line. Definite effusion of the knee no erythema ecchymosis or abnormal warmth. No instability valgus varus stressing. Specialty Comments:  No specialty comments available.  Imaging: No results found.   PMFS History: There are no active problems to display for this patient.  Past Medical History:  Diagnosis Date  . Hypertension     Family History  Problem Relation Age of Onset  . Hypertension    . Diabetes      Past Surgical History:  Procedure Laterality Date  . 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 Smoking status: Never Smoker  . Smokeless tobacco: Never Used  . Alcohol use No  . Drug use: Unknown  . Sexual activity: Not on file

## 2016-11-21 ENCOUNTER — Encounter (INDEPENDENT_AMBULATORY_CARE_PROVIDER_SITE_OTHER): Payer: Self-pay | Admitting: Physician Assistant

## 2016-11-21 ENCOUNTER — Ambulatory Visit (INDEPENDENT_AMBULATORY_CARE_PROVIDER_SITE_OTHER): Payer: Commercial Managed Care - HMO | Admitting: Physician Assistant

## 2016-11-21 ENCOUNTER — Ambulatory Visit (INDEPENDENT_AMBULATORY_CARE_PROVIDER_SITE_OTHER): Payer: Self-pay

## 2016-11-21 DIAGNOSIS — M25561 Pain in right knee: Secondary | ICD-10-CM

## 2016-11-21 DIAGNOSIS — M25562 Pain in left knee: Secondary | ICD-10-CM | POA: Diagnosis not present

## 2016-11-21 DIAGNOSIS — G8929 Other chronic pain: Secondary | ICD-10-CM | POA: Diagnosis not present

## 2016-11-21 NOTE — Progress Notes (Signed)
   Office Visit Note   Patient: Nathaniel Jones           Date of Birth: 12/05/1980           MRN: [id number] Visit Date: 11/21/2016              Requested by: L.Lupe Carneyean Mitchell, MD [address] [place], [place] [address] PCP: Lupe Carneyean Mitchell, MD   Assessment & Plan: Visit Diagnoses:  1. Chronic pain of both knees     Plan: We will try to gain approval for monitoring this injection of his right knee. Given a pullover open patellar knee brace. Continue to work on quad strengthening both knees  Follow-Up Instructions: Return for Supplemental injection.   Orders:  No orders of the defined types were placed in this encounter.  No orders of the defined types were placed in this encounter.     Procedures: No procedures performed   Clinical Data: No additional findings.   Subjective: Chief Complaint  Patient presents with  . Right Knee - Follow-up    Nathaniel Jones is here to follow up after right knee injection on 11/07/2016. He states that it is doing alright, there has been some weird popping going on in both knees.  He states that he is able to bend his leg, but he is not doing any activity that causes pain.     Review of Systems   Objective: Vital Signs: There were no vitals taken for this visit.  Physical Exam  Ortho Exam Bilateral knees with overall good range of motion. Right knee with patellofemoral crepitus with passive range of motion. No effusion no abnormal warmth erythema of either knee. Specialty Comments:  No specialty comments available.  Imaging: No results found.   PMFS History: There are no active problems to display for this patient.  Past Medical History:  Diagnosis Date  . Hypertension     Family History  Problem Relation Age of Onset  . Hypertension    . Diabetes      Past Surgical History:  Procedure Laterality Date  . 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 Smoking status: Never Smoker  . Smokeless tobacco: Never Used  . Alcohol use No  . Drug use: Unknown  . Sexual activity: Not on file

## 2017-01-10 DIAGNOSIS — R7303 Prediabetes: Secondary | ICD-10-CM | POA: Diagnosis not present

## 2017-01-10 DIAGNOSIS — R5383 Other fatigue: Secondary | ICD-10-CM | POA: Diagnosis not present

## 2017-01-10 DIAGNOSIS — G479 Sleep disorder, unspecified: Secondary | ICD-10-CM | POA: Diagnosis not present

## 2017-01-18 ENCOUNTER — Ambulatory Visit
Admission: RE | Admit: 2017-01-18 | Discharge: 2017-01-18 | Disposition: A | Discharge status: Home / Self Care | Payer: 59 | Source: Ambulatory Visit | Attending: Family Medicine | Admitting: Family Medicine

## 2017-01-18 ENCOUNTER — Other Ambulatory Visit: Payer: Self-pay | Admitting: Family Medicine

## 2017-01-18 DIAGNOSIS — K5900 Constipation, unspecified: Secondary | ICD-10-CM

## 2017-01-22 DIAGNOSIS — A084 Viral intestinal infection, unspecified: Secondary | ICD-10-CM | POA: Diagnosis not present

## 2017-01-22 DIAGNOSIS — R070 Pain in throat: Secondary | ICD-10-CM | POA: Diagnosis not present

## 2017-02-15 DIAGNOSIS — M25562 Pain in left knee: Secondary | ICD-10-CM | POA: Diagnosis not present

## 2017-02-15 DIAGNOSIS — M7711 Lateral epicondylitis, right elbow: Secondary | ICD-10-CM | POA: Diagnosis not present

## 2017-02-15 DIAGNOSIS — J019 Acute sinusitis, unspecified: Secondary | ICD-10-CM | POA: Diagnosis not present

## 2017-02-22 ENCOUNTER — Ambulatory Visit (INDEPENDENT_AMBULATORY_CARE_PROVIDER_SITE_OTHER): Payer: Commercial Managed Care - HMO | Admitting: Physician Assistant

## 2017-02-22 DIAGNOSIS — M7711 Lateral epicondylitis, right elbow: Secondary | ICD-10-CM

## 2017-02-22 DIAGNOSIS — M25562 Pain in left knee: Secondary | ICD-10-CM

## 2017-02-22 MED ORDER — LIDOCAINE HCL 1 % IJ SOLN
3.0000 mL | INTRAMUSCULAR | Status: AC | PRN
  Administered 2017-02-22: 3 mL

## 2017-02-22 MED ORDER — METHYLPREDNISOLONE ACETATE 40 MG/ML IJ SUSP
40.0000 mg | INTRAMUSCULAR | Status: AC | PRN
  Administered 2017-02-22: 40 mg via INTRA_ARTICULAR

## 2017-02-22 MED ORDER — LIDOCAINE HCL 1 % IJ SOLN
1.0000 mL | INTRAMUSCULAR | Status: AC | PRN
  Administered 2017-02-22: 1 mL

## 2017-02-22 NOTE — Progress Notes (Signed)
Office Visit Note   Patient: Nathaniel Jones           Date of Birth: 20-Sep-1981           MRN: [id number] Visit Date: 02/22/2017              Requested by: Nathaniel Riley, [name], MD [address] [place], [place] [address] PCP: Nathaniel Riley, [name], MD   Assessment & Plan: Visit Diagnoses:  1. Lateral epicondylitis, right elbow   2. Acute pain of left knee     Plan: Discussed with him proper lifting techniques for the lateral epicondylitis. Also showed him some stretching exercises for the lateral epicondylitis. He deferred an injection for lateral epicondylitis. May consider the future if pain persists or becomes worse. In regards to his knee will see him back in 2 weeks check his progress lack of. His pain continues or if symptoms become worse than May consider MRI to see if there is further propagation of the radial tear that he had on previous MRI.  Follow-Up Instructions: Return in about 2 weeks (around 03/08/2017).   Orders:  Orders Placed This Encounter  Procedures  . Large Joint Injection/Arthrocentesis   No orders of the defined types were placed in this encounter.     Procedures: Large Joint Inj Date/Time: 02/22/2017 5:07 PM Performed by: Nathaniel Jones Authorized by: Nathaniel Jones   Consent Given by:  Patient Indications:  Pain Location:  Knee Site:  L knee Needle Size:  22 G Approach:  Superolateral Ultrasound Guidance: No   Fluoroscopic Guidance: No   Medications:  40 mg methylPREDNISolone acetate 40 MG/ML; 1 mL lidocaine 1 %; 3 mL lidocaine 1 % Aspiration Attempted: Yes   Aspirate amount (mL):  0 Patient tolerance:  Patient tolerated the procedure well with no immediate complications     Clinical Data: No additional findings.   Subjective: Left knee pain Right elbow pain  HPI Nathaniel Jones comes in today for 2 new complaints one is right elbow pain is been urgent care twice distally has tendinitis. He is having some pain with lifting any  objects including just a tissue box. He's had no injury to the right elbow. As having no radicular symptoms down the arm. Left leg pain lateral aspect of the knee and questions if he irritated the IT band all doing the elliptical machine. 7 no radicular symptoms down the left leg. 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 On previous MRI he had degenerative fraying of the medial meniscus and a radial tear of the lateral meniscus. Denies any catching locking giving way. He is having some painful popping lateral aspect of the knee. Review of Systems Please see history of present illness.  Objective: Vital Signs: There were no vitals taken for this visit.  Physical Exam  Constitutional: He is oriented to person, place, and time. He appears well-developed and well-nourished. No distress.  Neurological: He is alert and oriented to person, place, and time.  Psychiatric: He has a normal mood and affect. His behavior is normal.    Ortho Exam Right elbow he has tenderness over the lateral epicondyle region. Extension of the right wrist against resistance causes pain lateral elbow. Radial pulses intact has full sensation of the hand and full motor of the right hand. Good range of motion of the right elbow without pain otherwise. Full supination pronation of the forearm. Left knee has tenderness over the lateral joint line of the knee.  Tenderness up into the IT band and over the left greater trochanteric region. No instability valgus varus stressing of the left knee. McMurray's is negative. He is unable to bring the leg to full extension lying flat on the table, Lacking the last 2-3. Positive edema no erythema of the left knee. Positive effusion very mild. Specialty Comments:  No specialty comments available.  Imaging: No results found.   PMFS History: There are no active problems to display for this patient.  Past Medical History:  Diagnosis Date  . Hypertension     Family  History  Problem Relation Age of Onset  . Hypertension Unknown   . Diabetes Unknown     Past Surgical History:  Procedure Laterality Date  . 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 Smoking status: Never Smoker  . Smokeless tobacco: Never Used  . Alcohol use No  . Drug use: Unknown  . Sexual activity: Not on file

## 2017-03-01 ENCOUNTER — Ambulatory Visit (INDEPENDENT_AMBULATORY_CARE_PROVIDER_SITE_OTHER): Payer: Commercial Managed Care - HMO | Admitting: Physician Assistant

## 2017-03-08 ENCOUNTER — Ambulatory Visit (INDEPENDENT_AMBULATORY_CARE_PROVIDER_SITE_OTHER): Payer: Commercial Managed Care - HMO | Admitting: Orthopaedic Surgery

## 2017-03-08 DIAGNOSIS — M545 Low back pain: Secondary | ICD-10-CM | POA: Diagnosis not present

## 2017-03-08 DIAGNOSIS — M7711 Lateral epicondylitis, right elbow: Secondary | ICD-10-CM

## 2017-03-08 DIAGNOSIS — G8929 Other chronic pain: Secondary | ICD-10-CM | POA: Insufficient documentation

## 2017-03-08 DIAGNOSIS — M7632 Iliotibial band syndrome, left leg: Secondary | ICD-10-CM | POA: Insufficient documentation

## 2017-03-08 NOTE — Progress Notes (Signed)
Office Visit Note   Patient: Nathaniel Jones           Date of Birth: 02/09/1981           MRN: [id number] Visit Date: 03/08/2017              Requested by: Clovis RileyMitchell, L.August Saucerean, MD [address] [place], [place] [address] PCP: Clovis RileyMitchell, L.August Saucerean, MD   Assessment & Plan: Visit Diagnoses:  1. Chronic bilateral low back pain without sciatica   2. Lateral epicondylitis, right elbow   3. It band syndrome, left     Plan: We had a long and thorough discussion about the need for physical therapy for all these areas. I showed him stretching exercises to try for the knee and for the lateral epicondylar area. I gave him prescription for physical therapy. We'll see him back in a month to see how his response to this.  Follow-Up Instructions: Return in about 4 weeks (around 04/05/2017).   Orders:  No orders of the defined types were placed in this encounter.  No orders of the defined types were placed in this encounter.     Procedures: No procedures performed   Clinical Data: No additional findings.   Subjective: No chief complaint on file.  the patient is being seen for 3 issues today. He has pain over the right elbow lateral epicondylar area, he has pain of his left knee area and IT band, and he has low back pain. All these things flare up on a daily basis. Overall detrimentally affects his activities of daily living, his quality of life, his mobility. He's been on anti-inflammatories in the past. It affects his working out quite a bit in terms of pain he is having. It affects his daily job as well. He has problems going up and down stairs. He denies any change in bowel or bladder function and denies any radicular symptoms or weakness in his upper or lower extremities.  HPI  Review of Systems He currently denies any headache, chest pain, short breath, fever, chills, nausea, vomiting.  Objective: Vital Signs: There were no vitals taken for this visit.  Physical Exam He is  alert and oriented 3 and in no acute distress Ortho Exam Examination of his right elbow shows full range of motion. He has significant tenderness over the lateral epicondylar area. The elbow is ligament was a stable. Examination of his left knee shows no effusion. He has pain over the iliotibial band and the posterior lateral corner of the knee but no instability the knee joint itself. His ligaments exam of the knee is normal left knee is McMurray's and Lachman's are negative. His varus valgus stressing of the left knee is normal. He has good motion of his lumbar spine images paraspinal muscular pain. He has excellent strength in his bilateral extremities with normal sensation. Specialty Comments:  No specialty comments available.  Imaging: No results found.   PMFS History: Patient Active Problem List   Diagnosis Date Noted  . Chronic bilateral low back pain without sciatica 03/08/2017  . Lateral epicondylitis, right elbow 03/08/2017  . It band syndrome, left 03/08/2017   Past Medical History:  Diagnosis Date  . Hypertension     Family History  Problem Relation Age of Onset  . Hypertension Unknown   . Diabetes Unknown     Past Surgical History:  Procedure Laterality Date  . 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 Smoking status: Never Smoker  . Smokeless tobacco: Never Used  . Alcohol use No  . Drug use: Unknown  . Sexual activity: Not on file

## 2017-04-10 ENCOUNTER — Ambulatory Visit (INDEPENDENT_AMBULATORY_CARE_PROVIDER_SITE_OTHER): Payer: 59 | Admitting: Orthopaedic Surgery

## 2017-04-19 ENCOUNTER — Ambulatory Visit (INDEPENDENT_AMBULATORY_CARE_PROVIDER_SITE_OTHER): Payer: 59 | Admitting: Orthopaedic Surgery

## 2017-04-19 ENCOUNTER — Encounter (INDEPENDENT_AMBULATORY_CARE_PROVIDER_SITE_OTHER): Payer: Self-pay | Admitting: Orthopaedic Surgery

## 2017-04-19 VITALS — Ht 76.0 in | Wt 310.0 lb

## 2017-04-19 DIAGNOSIS — M25562 Pain in left knee: Secondary | ICD-10-CM

## 2017-04-19 DIAGNOSIS — M7711 Lateral epicondylitis, right elbow: Secondary | ICD-10-CM | POA: Diagnosis not present

## 2017-04-19 DIAGNOSIS — G8929 Other chronic pain: Secondary | ICD-10-CM

## 2017-04-19 MED ORDER — LIDOCAINE HCL 1 % IJ SOLN
1.0000 mL | INTRAMUSCULAR | Status: AC | PRN
  Administered 2017-04-19: 1 mL

## 2017-04-19 MED ORDER — ETODOLAC 500 MG PO TABS: 500.0000 mg | ORAL_TABLET | Freq: Two times a day (BID) | ORAL | 3 refills | Status: DC | PRN

## 2017-04-19 MED ORDER — LIDOCAINE HCL 1 % IJ SOLN
3.0000 mL | INTRAMUSCULAR | Status: AC | PRN
  Administered 2017-04-19: 3 mL

## 2017-04-19 MED ORDER — METHYLPREDNISOLONE ACETATE 40 MG/ML IJ SUSP
40.0000 mg | INTRAMUSCULAR | Status: AC | PRN
  Administered 2017-04-19: 40 mg

## 2017-04-19 MED ORDER — METHYLPREDNISOLONE ACETATE 40 MG/ML IJ SUSP
40.0000 mg | INTRAMUSCULAR | Status: AC | PRN
  Administered 2017-04-19: 40 mg via INTRA_ARTICULAR

## 2017-04-19 NOTE — Progress Notes (Signed)
Office Visit Note   Patient: Nathaniel Jones           Date of Birth: August 13, 1981           MRN: [id number] Visit Date: 04/19/2017              Requested by: [name], [name], MD [address] [place], [place] [address] PCP: [name], [name], MD   Assessment & Plan: Visit Diagnoses:  1. Lateral epicondylitis, right elbow   2. Chronic pain of left knee     Plan: I will switch him to Lodine as an anti-inflammatory. We also talked about injections in his left knee and his right elbow he is agreeable to both these areas. He understands risk and his injections as well. I did fill out a new physical therapy prescription and stressed the importance of trying therapy. All questions were encouraged and answered.  Follow-Up Instructions: Return if symptoms worsen or fail to improve.   Orders:  Orders Placed This Encounter  Procedures  . Large Joint Injection/Arthrocentesis  . Hand/Upper Extremity Injection/Arthrocentesis   Meds ordered this encounter  Medications  . etodolac (LODINE) 500 MG tablet    Sig: Take 1 tablet (500 mg total) by mouth 2 (two) times daily between meals as needed.    Dispense:  60 tablet    Refill:  3      Procedures: Large Joint Inj Date/Time: 04/19/2017 5:28 PM Performed by: Kathryne Hitch Authorized by: Doneen Poisson Y   Location:  Knee Site:  L knee Ultrasound Guidance: No   Fluoroscopic Guidance: No   Arthrogram: No   Medications:  3 mL lidocaine 1 %; 40 mg methylPREDNISolone acetate 40 MG/ML Hand/UE Inj Date/Time: 04/19/2017 5:28 PM Performed by: Kathryne Hitch Authorized by: Kathryne Hitch   Condition: lateral epicondylitis   Site:  R elbow Medications:  1 mL lidocaine 1 %; 40 mg methylPREDNISolone acetate 40 MG/ML     Clinical Data: No additional findings.   Subjective: Chief Complaint  Patient presents with  . Lower Back - Pain  . Left Elbow - Pain   Mr. Nathaniel Jones comes in today with  complaints of left knee and IT band pain as well as right elbow lateral epicondylar pain. We have recommended therapy in the past but is been unable to get to this. He does wish for prescription again for [facility] physical therapy. He still has consistent pain in his left knee and is in the back of his knee and hamstring area and the gastroc area was performed orthoscopic surgery on this knee before. His pain is also consistent over the right lateral epicondylar area. He's not had injections in either areas and some time. He tries diclofenac as an anti-inflammatory does not work HPI  Review of Systems He currently denies any headache, chest pain, shortness of breath, fever, chills, nausea, vomiting  Objective: Vital Signs: Ht 6\' 4"  (1.93 m)   Wt (!) 310 lb (140.6 kg)   BMI 37.73 kg/m   Physical Exam He is alert and oriented 3 in no acute distress Ortho Exam Exam initially his right elbow shows pain over the lateral epicondyle consistent with lateral epicondylitis. The remainder of the elbow exam in upper extremity exam is normal. Elevation of his left knee shows no effusion. His pain is over the IT band as well as posterior knee and hamstrings. Specialty Comments:  No specialty comments available.  Imaging: No results found.   PMFS History: Patient Active Problem  List   Diagnosis Date Noted  . Chronic bilateral low back pain without sciatica 03/08/2017  . Lateral epicondylitis, right elbow 03/08/2017  . It band syndrome, left 03/08/2017   Past Medical History:  Diagnosis Date  . Hypertension     Family History  Problem Relation Age of Onset  . Hypertension Unknown   . Diabetes Unknown     Past Surgical History:  Procedure Laterality Date  . 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 Smoking status: Never Smoker  . Smokeless tobacco: Never Used  . Alcohol use No  . Drug use: Unknown  . Sexual  activity: Not on file

## 2017-04-24 DIAGNOSIS — M25521 Pain in right elbow: Secondary | ICD-10-CM | POA: Diagnosis not present

## 2017-04-24 DIAGNOSIS — M25562 Pain in left knee: Secondary | ICD-10-CM | POA: Diagnosis not present

## 2017-05-10 DIAGNOSIS — R05 Cough: Secondary | ICD-10-CM | POA: Diagnosis not present

## 2017-05-10 DIAGNOSIS — J019 Acute sinusitis, unspecified: Secondary | ICD-10-CM | POA: Diagnosis not present

## 2017-05-18 DIAGNOSIS — H10413 Chronic giant papillary conjunctivitis, bilateral: Secondary | ICD-10-CM | POA: Diagnosis not present

## 2017-05-18 DIAGNOSIS — H25812 Combined forms of age-related cataract, left eye: Secondary | ICD-10-CM | POA: Diagnosis not present

## 2017-05-22 ENCOUNTER — Ambulatory Visit (INDEPENDENT_AMBULATORY_CARE_PROVIDER_SITE_OTHER): Payer: 59 | Admitting: Physician Assistant

## 2017-05-22 DIAGNOSIS — S46211A Strain of muscle, fascia and tendon of other parts of biceps, right arm, initial encounter: Secondary | ICD-10-CM | POA: Diagnosis not present

## 2017-05-22 NOTE — Progress Notes (Signed)
   Office Visit Note   Patient: Nathaniel Jones           Date of Birth: 03/09/1981           MRN: [id number] Visit Date: 05/22/2017              Requested by: [name], [name], MD [address] [place], [place] [address] PCP: [name], [name], MD   Assessment & Plan: Visit Diagnoses:  1. Biceps rupture, proximal, right, initial encounter     Plan: Dr. Magnus Ivan and myself spoke with Nathaniel Jones at length about surgery for his right proximal biceps rupture versus conservative treatment. Like to treat this conservatively with rest and ice and anti-inflammatories. He'll follow up with [place] on an as-needed basis  Follow-Up Instructions: Return if symptoms worsen or fail to improve.   Orders:  No orders of the defined types were placed in this encounter.  No orders of the defined types were placed in this encounter.     Procedures: No procedures performed   Clinical Data: No additional findings.   Subjective: Right arm pain  HPI Nathaniel Jones is well known to Dr. Magnus Ivan services comes in today due to right shoulder and biceps pain. He reports that last Thursday was playing some basketball and heard something pop in his right arm. He had decreased range of motion after that. He had no pain in the right arm prior to this. Range of motion is slowly improving. Set no treatment for this. Review of Systems Review systems negative otherwise for before meals history of present illness  Objective: Vital Signs: There were no vitals taken for this visit.  Physical Exam  Constitutional: He is oriented to person, place, and time. He appears well-developed and well-nourished. No distress.  Neurological: He is alert and oriented to person, place, and time.  Skin: He is not diaphoretic.  Psychiatric: He has a normal mood and affect.    Ortho Exam Right biceps muscle belly just distal eyes compared to the left. He has tenderness along the right bicipital groove. No ecchymosis  erythema or edema. Has tenderness of the right biceps muscle belly. No tenderness distally. Able to fully forward flex maybe.the arm. Out of 5 strengths external/internal rotation against resistance bilateral shoulders. Specialty Comments:  No specialty comments available.  Imaging: No results found.   PMFS History: Patient Active Problem List   Diagnosis Date Noted  . Biceps rupture, proximal, right, initial encounter 05/22/2017  . Chronic bilateral low back pain without sciatica 03/08/2017  . Lateral epicondylitis, right elbow 03/08/2017  . It band syndrome, left 03/08/2017   Past Medical History:  Diagnosis Date  . Hypertension     Family History  Problem Relation Age of Onset  . Hypertension Unknown   . Diabetes Unknown     Past Surgical History:  Procedure Laterality Date  . 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 Smoking status: Never Smoker  . Smokeless tobacco: Never Used  . Alcohol use No  . Drug use: Unknown  . Sexual activity: Not on file

## 2017-06-18 ENCOUNTER — Other Ambulatory Visit (INDEPENDENT_AMBULATORY_CARE_PROVIDER_SITE_OTHER): Payer: Self-pay | Admitting: Orthopaedic Surgery

## 2017-06-19 NOTE — Telephone Encounter (Signed)
Please advise 

## 2017-06-30 DIAGNOSIS — K5900 Constipation, unspecified: Secondary | ICD-10-CM | POA: Diagnosis not present

## 2017-06-30 DIAGNOSIS — Z0001 Encounter for general adult medical examination with abnormal findings: Secondary | ICD-10-CM | POA: Diagnosis not present

## 2017-06-30 DIAGNOSIS — R079 Chest pain, unspecified: Secondary | ICD-10-CM | POA: Diagnosis not present

## 2017-06-30 DIAGNOSIS — R7303 Prediabetes: Secondary | ICD-10-CM | POA: Diagnosis not present

## 2017-07-03 ENCOUNTER — Telehealth: Payer: Self-pay | Admitting: Cardiovascular Disease

## 2017-07-03 NOTE — Telephone Encounter (Signed)
Received records from [facility] for appointment on 07/27/17 with Dr Tresa Endo.  Records put with Dr [name] schedule for 07/27/17. lp

## 2017-07-27 ENCOUNTER — Encounter: Payer: Self-pay | Admitting: Cardiovascular Disease

## 2017-07-27 ENCOUNTER — Ambulatory Visit (INDEPENDENT_AMBULATORY_CARE_PROVIDER_SITE_OTHER): Payer: 59 | Admitting: Cardiovascular Disease

## 2017-07-27 VITALS — BP 138/85 | HR 62 | Ht 76.0 in | Wt 314.0 lb

## 2017-07-27 DIAGNOSIS — G4719 Other hypersomnia: Secondary | ICD-10-CM

## 2017-07-27 DIAGNOSIS — G478 Other sleep disorders: Secondary | ICD-10-CM | POA: Diagnosis not present

## 2017-07-27 DIAGNOSIS — E669 Obesity, unspecified: Secondary | ICD-10-CM | POA: Diagnosis not present

## 2017-07-27 DIAGNOSIS — R0789 Other chest pain: Secondary | ICD-10-CM

## 2017-07-27 NOTE — Patient Instructions (Signed)
Medication Instructions:  Your physician recommends that you continue on your current medications as directed. Please refer to the Current Medication list given to you today.  Testing/Procedures: Your physician has requested that you have an echocardiogram. Echocardiography is a painless test that uses sound waves to create images of your heart. It provides your doctor with information about the size and shape of your heart and how well your heart's chambers and valves are working. This procedure takes approximately one hour. There are no restrictions for this procedure. [facility] location:  [address]  Your physician has requested that you have en exercise stress myoview. For further information please visit [email or web address]. Please follow instruction sheet, as given.  Your physician has recommended that you have a sleep study. This test records several body functions during sleep, including: brain activity, eye movement, oxygen and carbon dioxide blood levels, heart rate and rhythm, breathing rate and rhythm, the flow of air through your mouth and nose, snoring, body muscle movements, and chest and belly movement.   Follow-Up: Your physician recommends that you schedule a follow-up appointment in: after testing with Dr. Tresa EndoKelly.    Any Other Special Instructions Will Be Listed Below (If Applicable).     If you need a refill on your cardiac medications before your next appointment, please call your pharmacy.

## 2017-07-27 NOTE — Progress Notes (Signed)
Cardiology Office Note    Date:  07/27/2017   ID:  Nathaniel Jones, DOB 07/05/1981, MRN [id number]  PCP:  Nathaniel Jones, Nathaniel Saucerean, MD  Cardiologist:  Nathaniel Guadalajarahomas Eliam Snapp, MD   No chief complaint on file.  Cardiology consultation per request of Nathaniel Jones for evaluation of chest burning sensation  History of Present Illness:  Nathaniel Jones is a 36 y.o. male who is referred through the courtesy of Nathaniel Jones for evaluation of chest pain with atypical features as well as sleepiness.  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  Since his playing days when his weight was 255 pounds, he has gained weight to his present weight of 314 pounds. He continues to exercise at least 4 days per week.  He also plays basketball and recently has noticed more shortness of breath.   He notes a burning in his stomach which sometimes occurs with exercise.  He sits down and in 5-10 minutes the sensation abates.  He had undergone a CT scan of his abdomen remotely. He denies any definitive chest tightness or palpitations.  At times has noticed rare episode of dizziness.  Recently, he is felt to be drained of energy.  He had seen Nathaniel Jones.  During his evaluation, thee was concern of sleepiness, and it was recommended that he undergo a sleep evaluation.  Apparently he never followed up with this.  Becuse of continued symptomatology, he is now referred for evaluation.  In the office today.  I calculated an Epworth Sleepiness Scale score which endorsed at 15 and is consistent with excessive daytime sleepiness as shown below:  Epworth Sleepiness Scale: Situation   Chance of Dozing/Sleeping (0 = never , 1 = slight chance , 2 = moderate chance , 3 = high chance )   sitting and reading 0   watching TV 0   sitting inactive in a public place 2   being a passenger in a motor vehicle for an hour or more 3   lying down in the afternoon 3   sitting and talking to someone 1   sitting quietly after lunch (no alcohol) 3   while stopped for a few minutes in traffic as the driver 3   Total Score  15     Past Medical History:  Diagnosis Date  . Hypertension     Past Surgical History:  Procedure Laterality Date  . KNEE SURGERY Right   . none      Current Medications: Outpatient Medications Prior to Visit  Medication Sig Dispense Refill  . cyclobenzaprine (FLEXERIL) 10 MG tablet Take 10 mg by mouth 3 (three) times daily as needed.     . diclofenac (VOLTAREN) 75 MG EC tablet Take 75 mg by mouth 4 (four) times daily.     Marland Kitchen. gabapentin (NEURONTIN) 300 MG capsule TAKE 1 CAPSULE BY MOUTH DAILY AT BEDTIME FOR NERVE PAIN 30 capsule 1  . traZODone (DESYREL) 50 MG tablet Take 50 mg by mouth at bedtime as needed.     . etodolac (LODINE) 500 MG tablet Take 1 tablet (500 mg total) by mouth 2 (two) times daily between meals as needed. 60 tablet 3  . etodolac (LODINE) 500 MG tablet etodolac 500 mg tablet    . predniSONE (DELTASONE) 20 MG tablet prednisone 20 mg tablet  3 tabs po qd for 3 days, 2 tabs po qd for 3 days, 1 tab po qd for 3 days  No facility-administered medications prior to visit.      Allergies:   Ibuprofen   Social History   Social History  . Marital status: Single    Spouse name: N/A  . Number of children: 0  . Years of education: N/A   Occupational History  . 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 Smoking status: Never Smoker  . Smokeless tobacco: Never Used  . Alcohol use No  . Drug use: No  . Sexual activity: Not Asked   Other Topics Concern  . None   Social History Narrative  . None    Socially he is single.  He was born in [place] and went to [employer] high school..  At 6 feet 4 inch, 255 pounds, he played tight end for [employer].  He has a BS degree.  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  Family  History:  The patient's family history includes Diabetes in his brother, maternal grandmother, mother, and unknown relative; High Cholesterol in his maternal grandmother; Hypertension in his father, mother, and unknown relative; Stroke in his maternal grandfather and maternal grandmother.  His mother is age 92 and has diabetes and hypertension.  Father is 35 and has hypertension.  Brother has diabetes.  He has one child who is 41 years old.  There is a family history of stroke.  ROS General: Negative; No fevers, chills, or night sweats;  HEENT: Negative; No changes in vision or hearing, sinus congestion, difficulty swallowing Pulmonary: Negative; No cough, wheezing, shortness of breath, hemoptysis Cardiovascular: Negative; No chest pain, presyncope, syncope, palpitations GI: Negative; No nausea, vomiting, diarrhea, or abdominal pain GU: Negative; No dysuria, hematuria, or difficulty voiding Musculoskeletal: Negative; no myalgias, joint pain, or weakness Hematologic/Oncology: Negative; no easy bruising, bleeding Endocrine: Negative; no heat/cold intolerance; no diabetes Neuro: Negative; no changes in balance, headaches Skin: Negative; No rashes or skin lesions Psychiatric: Negative; No behavioral problems, depression Sleep: Positive for sleepiness, daytime fatigue, nonrestorative sleep, , hypersomnolence, bruxism, restless legs, hypnogognic hallucinations, no cataplexy Other comprehensive 14 point system review is negative.   PHYSICAL EXAM:   VS:  BP 138/85 (BP Location: Right Arm, Cuff Size: Large)   Pulse 62   Ht 6\' 4"  (1.93 m)   Wt (!) 314 lb (142.4 kg)   BMI 38.22 kg/m     Repeat blood pressure by me was 134/84  Wt Readings from Last 3 Encounters:  07/27/17 (!) 314 lb (142.4 kg)  04/19/17 (!) 310 lb (140.6 kg)  08/01/16 (!) 308 lb (139.7 kg)    General: Alert, oriented, no distress.  Athletic appearing. Skin: normal turgor, no rashes, warm and dry HEENT: Normocephalic,  atraumatic. Pupils equal round and reactive to light; sclera anicteric; extraocular muscles intact; Fundi no hemorrhages or exudates.  Disc flat Nose without nasal septal hypertrophy Mouth/Parynx benign; Mallinpatti scale 3 Neck: Thick neck with a neck size of 19-1/2 ; No JVD, no carotid bruits; normal carotid upstroke Lungs: clear to ausculatation and percussion; no wheezing or rales Chest wall: Left fourth and fifth costochondral tenderness Heart: PMI not displaced, RRR, s1 s2 normal, 1/6 systolic murmur, no diastolic murmur, no rubs, gallops, thrills, or heaves Abdomen: soft, nontender; no hepatosplenomehaly, BS+; abdominal aorta nontender and not dilated by palpation. Back: no CVA tenderness Pulses 2+ Musculoskeletal: full range of motion, normal strength, no joint deformities Extremities: no clubbing cyanosis or edema, Homan's  sign negative  Neurologic: grossly nonfocal; Cranial nerves grossly wnl Psychologic: Normal mood and affect   Studies/Labs Reviewed:   EKG:  EKG is ordered today.  ECG (independently read by me): Sinus bradycardia at 56 bpm.  No ectopy.  Normal intervals.  Recent Labs: BMP Latest Ref Rng & Units 09/06/2009  Glucose 70 - 99 mg/dL 90  BUN 6 - 23 mg/dL 8  Creatinine 0.4 - 1.5 mg/dL 0.8  Sodium 604 - 540 mEq/L 141  Potassium 3.5 - 5.1 mEq/L 3.9  Chloride 96 - 112 mEq/L 103     No flowsheet data found.  CBC Latest Ref Rng & Units 09/06/2009 09/06/2009  WBC 4.0 - 10.5 K/uL - 5.6  Hemoglobin 13.0 - 17.0 g/dL 98.1 19.1  Hematocrit 47.8 - 52.0 % 43.0 42.0  Platelets 150 - 400 K/uL - 218   Lab Results  Component Value Date   MCV 89.5 09/06/2009   No results found for: TSH No results found for: HGBA1C   BNP No results found for: BNP  ProBNP No results found for: PROBNP   Lipid Panel  No results found for: CHOL, TRIG, HDL, CHOLHDL, VLDL, LDLCALC, LDLDIRECT   RADIOLOGY: No results found.   Additional studies/ records that were reviewed today  include:  I reviewed the office records from Dr. Lupe Carney.  I also reviewed the laboratory from [facility], which showed a hemoglobin A1c of 6.0, glucose 87, creatinine 1.04.  BUN 11.   Testosterone 290.2    ASSESSMENT:    No diagnosis found.   PLAN:  Mr. Charlaine Dalton 36 year old African-American male who has experienced symptoms of somewhat atypical chest pain and also stomach burning following exercise.  Reportedly in the past.  A CT of his abdomen in 2015 did not reveal any explanation for the patient's abdominal pain.  There are minimal prominent mesenteric and retroperitoneal lymph nodes of doubtful significance.  He is athletic appearing, but since his football days.  His weight has increased from 255 pounds to 3-14 pounds.  He 6 feet 4 and BMI is 38.22, consistent with obesity.  I'm scheduling him for an echo Doppler study to assess systolic and diastolic function as well as valvular architecture.  With his episodic exertional symptomatology.  I have recommended he undergo an exercise Myoview study to make certain his symptoms or not ischemia mediated.  He never followed up with his plan sleep evaluation at [facility] and would like me to follow-up with this if possible.  I will schedule him for a sleep study to evaluate for potential obstructive sleep apnea.  I reviewed the laboratory done at [facility].  I will see him back in the office in follow-up of the above studies and further recommendations will be made at that time.   Medication Adjustments/Labs and Tests Ordered: Current medicines are reviewed at length with the patient today.  Concerns regarding medicines are outlined above.  Medication changes, Labs and Tests ordered today are listed in the Patient Instructions below. There are no Patient Instructions on file for this visit.   Signed, Nathaniel Guadalajara, MD  07/27/2017 2:56 PM    [facility] [address], [address], [place], [place]  [address] Phone: [phone]

## 2017-08-04 ENCOUNTER — Other Ambulatory Visit: Payer: Self-pay

## 2017-08-04 ENCOUNTER — Ambulatory Visit (HOSPITAL_COMMUNITY): Payer: 59 | Attending: Cardiovascular Disease

## 2017-08-04 DIAGNOSIS — I082 Rheumatic disorders of both aortic and tricuspid valves: Secondary | ICD-10-CM | POA: Insufficient documentation

## 2017-08-04 DIAGNOSIS — E669 Obesity, unspecified: Secondary | ICD-10-CM | POA: Diagnosis not present

## 2017-08-04 DIAGNOSIS — R0789 Other chest pain: Secondary | ICD-10-CM | POA: Diagnosis not present

## 2017-08-04 DIAGNOSIS — Z6838 Body mass index (BMI) 38.0-38.9, adult: Secondary | ICD-10-CM | POA: Diagnosis not present

## 2017-08-04 DIAGNOSIS — I119 Hypertensive heart disease without heart failure: Secondary | ICD-10-CM | POA: Diagnosis not present

## 2017-08-04 DIAGNOSIS — R079 Chest pain, unspecified: Secondary | ICD-10-CM | POA: Diagnosis present

## 2017-08-09 ENCOUNTER — Inpatient Hospital Stay (HOSPITAL_COMMUNITY): Admission: RE | Admit: 2017-08-09 | Payer: 59 | Source: Ambulatory Visit

## 2017-08-10 ENCOUNTER — Ambulatory Visit (HOSPITAL_COMMUNITY): Payer: 59

## 2017-08-15 ENCOUNTER — Telehealth (HOSPITAL_COMMUNITY): Payer: Self-pay

## 2017-08-15 NOTE — Telephone Encounter (Signed)
Encounter complete. 

## 2017-08-17 ENCOUNTER — Telehealth (HOSPITAL_COMMUNITY): Payer: Self-pay

## 2017-08-17 ENCOUNTER — Ambulatory Visit (HOSPITAL_COMMUNITY): Payer: 59

## 2017-08-17 NOTE — Telephone Encounter (Signed)
Encounter complete. 

## 2017-08-18 ENCOUNTER — Inpatient Hospital Stay (HOSPITAL_COMMUNITY)
Admission: RE | Admit: 2017-08-18 | Discharge: 2017-08-18 | Disposition: A | Discharge status: Home / Self Care | Payer: 59 | Source: Ambulatory Visit | Attending: Cardiovascular Disease | Admitting: Cardiovascular Disease

## 2017-08-18 ENCOUNTER — Ambulatory Visit (HOSPITAL_COMMUNITY): Payer: 59

## 2017-08-18 DIAGNOSIS — R0789 Other chest pain: Secondary | ICD-10-CM

## 2017-08-22 ENCOUNTER — Encounter (HOSPITAL_COMMUNITY): Payer: Self-pay

## 2017-08-22 ENCOUNTER — Ambulatory Visit (HOSPITAL_COMMUNITY)
Admission: RE | Admit: 2017-08-22 | Discharge: 2017-08-22 | Disposition: A | Discharge status: Home / Self Care | Payer: 59 | Source: Ambulatory Visit | Attending: Cardiology | Admitting: Cardiology

## 2017-08-22 DIAGNOSIS — R0789 Other chest pain: Secondary | ICD-10-CM | POA: Diagnosis not present

## 2017-08-22 MED ORDER — TECHNETIUM TC 99M TETROFOSMIN IV KIT
29.5000 | PACK | Freq: Once | INTRAVENOUS | Status: AC | PRN
  Administered 2017-08-22: 29.5 via INTRAVENOUS
  Filled 2017-08-22: qty 30

## 2017-08-23 ENCOUNTER — Ambulatory Visit (HOSPITAL_COMMUNITY)
Admission: RE | Admit: 2017-08-23 | Discharge: 2017-08-23 | Disposition: A | Discharge status: Home / Self Care | Payer: 59 | Source: Ambulatory Visit | Attending: Cardiology | Admitting: Cardiology

## 2017-08-23 LAB — MYOCARDIAL PERFUSION IMAGING
CHL CUP NUCLEAR SRS: 2
CHL CUP NUCLEAR SSS: 5
CHL CUP RESTING HR STRESS: 54 {beats}/min
CSEPED: 12 min
CSEPEW: 13.7 METS
CSEPPHR: 160 {beats}/min
Exercise duration (sec): 1 s
LV dias vol: 179 mL (ref 62–150)
LVSYSVOL: 88 mL
MPHR: 183 {beats}/min
Percent HR: 87 %
RPE: 18
SDS: 3
TID: 1.12

## 2017-08-23 MED ORDER — TECHNETIUM TC 99M TETROFOSMIN IV KIT
30.7000 | PACK | Freq: Once | INTRAVENOUS | Status: AC | PRN
  Administered 2017-08-23: 30.7 via INTRAVENOUS

## 2017-09-08 ENCOUNTER — Ambulatory Visit (HOSPITAL_BASED_OUTPATIENT_CLINIC_OR_DEPARTMENT_OTHER): Payer: 59 | Attending: Cardiovascular Disease | Admitting: Cardiovascular Disease

## 2017-09-08 VITALS — Ht 75.0 in | Wt 305.0 lb

## 2017-09-08 DIAGNOSIS — R079 Chest pain, unspecified: Secondary | ICD-10-CM | POA: Diagnosis not present

## 2017-09-08 DIAGNOSIS — G478 Other sleep disorders: Secondary | ICD-10-CM | POA: Diagnosis not present

## 2017-09-08 DIAGNOSIS — R400 Somnolence: Secondary | ICD-10-CM | POA: Diagnosis present

## 2017-09-08 DIAGNOSIS — G4719 Other hypersomnia: Secondary | ICD-10-CM

## 2017-09-08 DIAGNOSIS — R0683 Snoring: Secondary | ICD-10-CM | POA: Diagnosis not present

## 2017-09-12 ENCOUNTER — Other Ambulatory Visit (HOSPITAL_BASED_OUTPATIENT_CLINIC_OR_DEPARTMENT_OTHER): Payer: Self-pay

## 2017-09-12 DIAGNOSIS — G478 Other sleep disorders: Secondary | ICD-10-CM

## 2017-09-12 DIAGNOSIS — G4719 Other hypersomnia: Secondary | ICD-10-CM

## 2017-09-12 DIAGNOSIS — R0789 Other chest pain: Secondary | ICD-10-CM

## 2017-09-16 ENCOUNTER — Encounter (HOSPITAL_BASED_OUTPATIENT_CLINIC_OR_DEPARTMENT_OTHER): Payer: Self-pay | Admitting: Cardiovascular Disease

## 2017-09-16 NOTE — Procedures (Signed)
Patient Name: Nathaniel Jones, Nathaniel Jones Study Date: 09/08/2017 Gender: Male D.O.B: July 20, 1981 Age (years): 36 Referring Provider: Nicki Guadalajarahomas Tylynn Braniff MD, ABSM Height (inches): 73 Interpreting Physician: Nicki Guadalajarahomas Mccall Lomax MD, ABSM Weight (lbs): 305 RPSGT: Cherylann ParrDubili, Fred BMI: 40 MRN: [id number] Neck Size: 19.50  CLINICAL INFORMATION Sleep Study Type: NPSG  Indication for sleep study: Fatigue, Obesity, Snoring, Witnessed Apneas  Epworth Sleepiness Score:  16  SLEEP STUDY TECHNIQUE As per the AASM Manual for the Scoring of Sleep and Associated Events v2.3 (April 2016) with a hypopnea requiring 4% desaturations.  The channels recorded and monitored were frontal, central and occipital EEG, electrooculogram (EOG), submentalis EMG (chin), nasal and oral airflow, thoracic and abdominal wall motion, anterior tibialis EMG, snore microphone, electrocardiogram, and pulse oximetry.  MEDICATIONS  cyclobenzaprine (FLEXERIL) 10 MG tablet    diclofenac (VOLTAREN) 75 MG EC tablet    gabapentin (NEURONTIN) 300 MG capsule    traZODone (DESYREL) 50 MG tablet     Medications self-administered by patient taken the night of the study : TRAZODONE  SLEEP ARCHITECTURE The study was initiated at 10:35:45 PM and ended at 4:33:23 AM.  Sleep onset time was 33.4 minutes and the sleep efficiency was 57.7%. The total sleep time was 206.5 minutes.  Wake after sleep onset (WASO) was 117.7 minutes  Stage REM latency was 142.5 minutes.  The patient spent 10.41% of the night in stage N1 sleep, 76.76% in stage N2 sleep, 0.00% in stage N3 and 12.83% in REM.  Alpha intrusion was absent.  Supine sleep was 47.72%.  RESPIRATORY PARAMETERS The overall apnea/hypopnea index (AHI) was 2.3 per hour. .  The respiratory disturbance index (RDI) was 2.3 per hour. There were 0 total apneas, including 0 obstructive, 0 central and 0 mixed apneas. There were 8 hypopneas and 0 RERAs.  The AHI during Stage REM sleep was 4.5 per hour.  AHI  while supine was 4.3 per hour.  The mean oxygen saturation was 95.98%. The minimum SpO2 during sleep was 91.00%.  Soft snoring was noted during this study.  CARDIAC DATA The 2 lead EKG demonstrated sinus rhythm. The mean heart rate was 56.68 beats per minute. Other EKG findings include: None. LEG MOVEMENT DATA The total PLMS were 0 with a resulting PLMS index of 0.00. Associated arousal with leg movement index was 0.0 .  IMPRESSIONS - Increased upper airway resistance syndrome (UARS) without significant obstructive sleep apnea.  - No significant central sleep apnea occurred during this study (CAI = 0.0/h). - Minimal or no oxygen desaturation during the study (Min O2 = 91.00%). - Abnormal sleep architecture with absence of slow-wave sleep and prolonged latency to REM sleep. - The patient snored with soft snoring volume. - No cardiac abnormalities were noted during this study. - Clinically significant periodic limb movements did not occur during sleep. No significant associated arousals.  DIAGNOSIS - Increased upper airway resistance - Excessive daytime sleepiness  RECOMMENDATIONS - Effort should be made to optimize nasal and oral pharyngeal patency. - There is no indication for CPAP therapy at present. - If significant excessive daytime sleepiness persists, consider an evaluation with a multiple latency sleep test to assess for idiopathic hypersomnolence or narcolepsy. - Avoid alcohol, sedatives and other CNS depressants that may worsen sleep apnea and disrupt normal sleep architecture. - Sleep hygiene and adequate sleep duration should be reviewed to assess factors that may improve sleep quality. - Weight management and regular exercise should be initiated or continued if appropriate.  [Electronically signed] 09/16/2017 05:21 PM  Nicki Guadalajarahomas Thia Olesen MD, FACC,ABSM Diplomate, American Board of Sleep Medicine   NPI: [id number] [id number] [facility] PH: [phone]   FX:  [phone]) [phone] ACCREDITED BY THE AMERICAN ACADEMY OF SLEEP MEDICINE

## 2017-10-04 ENCOUNTER — Telehealth: Payer: Self-pay | Admitting: *Deleted

## 2017-10-04 NOTE — Telephone Encounter (Signed)
-----   Message from Lennette Biharihomas A Kelly, MD sent at 09/16/2017  5:28 PM EST ----- Nathaniel Jones , please notify the patient the results of his sleep study.  At present, there is no need for CPAP therapy.  He has excessive daytime sleepiness.  Needs improved sleep hygiene and sleep duration.  If symptoms persist, future MLST evaluation can be considered to evaluate for idiopathic hypersomnolence.

## 2017-10-04 NOTE — Telephone Encounter (Signed)
Patient aware of sleep study results and verbalized understanding.  States he is still have difficulty sleeping and is unable to sleep at night.  Continues to experience excessive daytime fatigue/sleepiness.   Patient has upcoming OV with Dr. Tresa EndoKelly 1/9, will discuss further with him at this time.

## 2017-10-04 NOTE — Progress Notes (Signed)
1/2 Patient aware and verbalized understanding.  OV 1/9 with Dr. Tresa EndoKelly.

## 2017-10-11 ENCOUNTER — Ambulatory Visit: Payer: 59 | Admitting: Cardiovascular Disease

## 2017-10-11 ENCOUNTER — Encounter: Payer: Self-pay | Admitting: Cardiovascular Disease

## 2017-10-11 VITALS — BP 118/82 | HR 74 | Ht 76.0 in | Wt 311.2 lb

## 2017-10-11 DIAGNOSIS — G4719 Other hypersomnia: Secondary | ICD-10-CM | POA: Diagnosis not present

## 2017-10-11 DIAGNOSIS — R0789 Other chest pain: Secondary | ICD-10-CM

## 2017-10-11 DIAGNOSIS — E669 Obesity, unspecified: Secondary | ICD-10-CM | POA: Diagnosis not present

## 2017-10-11 NOTE — Progress Notes (Signed)
Cardiology Office Note    Date:  10/12/2017   ID:  Nathaniel Jones, DOB 08/25/1981, MRN [id number]  PCP:  Nathaniel Jones, L.Nathaniel Saucerean, MD  Cardiologist:  Nathaniel Guadalajarahomas Bethann Qualley, MD   No chief complaint on file.  F/U cardiology evaluation; initially referred by  Dr. Lupe Carneyean Jones for evaluation of chest burning sensation  History of Present Illness:  Nathaniel Jones is a 37 y.o. male who was referred through the courtesy of Dr. Lupe Carneyean Jones for evaluation of chest pain with atypical features as well as sleepiness.  I saw him for initial evaluation on 07/27/2017.  He presents for follow-up evaluation.  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  Since his playing days when his weight was 255 pounds, he has gained weight to his present weight of 314 pounds. He continues to exercise at least 4 days per week.  He also plays basketball and recently has noticed more shortness of breath.   He notes a burning in his stomach which sometimes occurs with exercise.  He sits down and in 5-10 minutes the sensation abates.  He had undergone a CT scan of his abdomen remotely. He denies any definitive chest tightness or palpitations.  At times has noticed rare episode of dizziness.  Recently, he is felt to be drained of energy.  He had seen Dr. Lupe Carneyean Jones.  During his evaluation, there was concern of sleepiness, and it was recommended that he undergo a sleep evaluation.  Apparently he never followed up with this.  Becuse of continued symptomatology, he is now referred for evaluation.  I calculated an Epworth Sleepiness Scale score which endorsed at 15 and is consistent with excessive daytime sleepiness as shown below:  Epworth Sleepiness Scale: Situation   Chance of Dozing/Sleeping (0 = never , 1 = slight chance , 2 = moderate chance , 3 = high chance )   sitting and reading 0   watching TV 0   sitting inactive in a public place 2   being a passenger in a motor vehicle for an hour or more 3     lying down in the afternoon 3   sitting and talking to someone 1   sitting quietly after lunch (no alcohol) 3   while stopped for a few minutes in traffic as the driver 3   Total Score  15   With his episodic exertional symptomatology.  I recommended that he undergo an exercise Myoview study to make certain his symptoms were not ischemia mediated.  This was performed on 08/22/2017 and was normal.  Estimated EF was 51%.  There was normal perfusion.  He exercised for 12 minutes, had a estimated workload of 13.7 METs, and did not develop chest pain or ECG changes.  Perfusion imaging was normal.  An echo Doppler study revealed an ejection fraction of 55% without wall motion abnormality.  He had mild aortic sclerosis with trivial AR.  There was mild LA dilation.  There was mild TR.  Normal pulmonary pressures.  Due to concerns for sleep apnea, he underwent a diagnostic polysomnogram on 09/08/2017.  This suggested increased upper airway resistance syndrome (UARS) with an AHI of 2.3 per hour and RDI of 2.3 per hour.  He did not have significant obstructive sleep apnea.  There was minimal or no oxygen desaturation to a nadir of 91%.  There was soft snoring.  He has continued to have significant daytime sleepiness.  However, upon further questioning, he essentially never  gets more than 5 hours of sleep per night.  In time, she is staying up late and typically to 1 or 2 AM and may wake up at 5:30 or 6 AM.  He and his wife are separated and during the week and at times on weekends he cares for his son.  He continues to admit to fatigue and daytime sleepiness.  When he presented for his sleep study, his Epworth Sleepiness Scale score endorsed at 16.  He presents for reevaluation.  Past Medical History:  Diagnosis Date  . Hypertension     Past Surgical History:  Procedure Laterality Date  . KNEE SURGERY Right   . none      Current Medications: Outpatient Medications Prior to Visit  Medication Sig  Dispense Refill  . cyclobenzaprine (FLEXERIL) 10 MG tablet Take 10 mg by mouth 3 (three) times daily as needed.     . diclofenac (VOLTAREN) 75 MG EC tablet Take 75 mg by mouth 4 (four) times daily.     Marland Kitchen gabapentin (NEURONTIN) 300 MG capsule TAKE 1 CAPSULE BY MOUTH DAILY AT BEDTIME FOR NERVE PAIN 30 capsule 1  . traZODone (DESYREL) 50 MG tablet Take 50 mg by mouth at bedtime as needed.      No facility-administered medications prior to visit.      Allergies:   Ibuprofen   Social History   Socioeconomic History  . Marital status: Single    Spouse name: None  . Number of children: 0  . Years of education: None  . Highest education level: None  Social Needs  . Financial resource strain: None  . Food insecurity - worry: None  . Food insecurity - inability: None  . Transportation needs - medical: None  . Transportation needs - non-medical: None  Occupational History  . Occupation: recreation Runner, broadcasting/film/video: UNEMPLOYED  Tobacco Use  . Smoking status: Never Smoker  . Smokeless tobacco: Never Used  Substance and Sexual Activity  . Alcohol use: No  . Drug use: No  . Sexual activity: None  Other Topics Concern  . None  Social History Narrative  . None    Socially he is single.  He was born in [place] and went to [employer] high school..  At 6 feet 4 inch, 255 pounds, he played tight end for [employer].  He has a BS degree.  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  Family History:  The patient's family history includes Diabetes in his brother, maternal grandmother, mother, and unknown relative; High Cholesterol in his maternal grandmother; Hypertension in his father, mother, and unknown relative; Stroke in his maternal grandfather and maternal grandmother.  His mother is age 44 and has diabetes and hypertension.  Father is 38 and has hypertension.  Brother has diabetes.  He has  one child who is 63 years old.  There is a family history of stroke.  ROS General: Negative; No fevers, chills, or night sweats;  HEENT: Negative; No changes in vision or hearing, sinus congestion, difficulty swallowing Pulmonary: Negative; No cough, wheezing, shortness of breath, hemoptysis Cardiovascular: Negative; No chest pain, presyncope, syncope, palpitations GI: Negative; No nausea, vomiting, diarrhea, or abdominal pain GU: Negative; No dysuria, hematuria, or difficulty voiding Musculoskeletal: Negative; no myalgias, joint pain, or weakness Hematologic/Oncology: Negative; no easy bruising, bleeding Endocrine: Negative; no heat/cold intolerance; no diabetes Neuro: Negative; no changes in balance, headaches Skin: Negative;  No rashes or skin lesions Psychiatric: Negative; No behavioral problems, depression Sleep: Positive for sleepiness, daytime fatigue, nonrestorative sleep, , hypersomnolence, bruxism, restless legs, hypnogognic hallucinations, no cataplexy Other comprehensive 14 point system review is negative.   PHYSICAL EXAM:   VS:  BP 118/82   Pulse 74   Ht 6\' 4"  (1.93 m)   Wt (!) 311 lb 3.2 oz (141.2 kg)   BMI 37.88 kg/m     Repeat blood pressure by me was 124/84.  Wt Readings from Last 3 Encounters:  10/11/17 (!) 311 lb 3.2 oz (141.2 kg)  09/08/17 (!) 305 lb (138.3 kg)  08/22/17 (!) 314 lb (142.4 kg)    General: Alert, oriented, no distress.  Athletic appearing Skin: normal turgor, no rashes, warm and dry HEENT: Normocephalic, atraumatic. Pupils equal round and reactive to light; sclera anicteric; extraocular muscles intact;  Nose without nasal septal hypertrophy Mouth/Parynx benign; Mallinpatti scale 3 Neck: Thick neck; No JVD, no carotid bruits; normal carotid upstroke Lungs: clear to ausculatation and percussion; no wheezing or rales Chest wall: Improvement in prior costochondral tenderness Heart: PMI not displaced, RRR, s1 s2 normal, 1/6 systolic murmur, no  diastolic murmur, no rubs, gallops, thrills, or heaves Abdomen: soft, nontender; no hepatosplenomehaly, BS+; abdominal aorta nontender and not dilated by palpation. Back: no CVA tenderness Pulses 2+ Musculoskeletal: full range of motion, normal strength, no joint deformities Extremities: no clubbing cyanosis or edema, Homan's sign negative  Neurologic: grossly nonfocal; Cranial nerves grossly wnl Psychologic: Normal mood and affect   Studies/Labs Reviewed:   EKG:  EKG is ordered today.  Normal sinus rhythm at 74 bpm.  No ectopy.  Normal intervals.  07/27/2017 ECG (independently read by me): Sinus bradycardia at 56 bpm.  No ectopy.  Normal intervals.  Recent Labs: BMP Latest Ref Rng & Units 09/06/2009  Glucose 70 - 99 mg/dL 90  BUN 6 - 23 mg/dL 8  Creatinine 0.4 - 1.5 mg/dL 0.8  Sodium 161 - 096 mEq/L 141  Potassium 3.5 - 5.1 mEq/L 3.9  Chloride 96 - 112 mEq/L 103     No flowsheet data found.  CBC Latest Ref Rng & Units 09/06/2009 09/06/2009  WBC 4.0 - 10.5 K/uL - 5.6  Hemoglobin 13.0 - 17.0 g/dL 04.5 40.9  Hematocrit 81.1 - 52.0 % 43.0 42.0  Platelets 150 - 400 K/uL - 218   Lab Results  Component Value Date   MCV 89.5 09/06/2009   No results found for: TSH No results found for: HGBA1C   BNP No results found for: BNP  ProBNP No results found for: PROBNP   Lipid Panel  No results found for: CHOL, TRIG, HDL, CHOLHDL, VLDL, LDLCALC, LDLDIRECT   RADIOLOGY: No results found.   Additional studies/ records that were reviewed today include:  I reviewed the office records from Dr. Lupe Carney.  I also reviewed the laboratory from [facility], which showed a hemoglobin A1c of 6.0, glucose 87, creatinine 1.04.  BUN 11.   Testosterone 290.2 I reviewed his nuclear stress test, 2-D echo Doppler study, and sleep study.   ASSESSMENT:    1. Atypical chest pain   2. Excessive daytime sleepiness   3. Obesity (BMI 35.0-39.9 without comorbidity)      PLAN:  Mr. Darl Kuss  is a 37 year old African-American male who experienced symptoms of somewhat atypical chest pain and also stomach burning following exercise. He is athletic appearing, but since his football days his weight has increased from 255 pounds to 314 pounds.  His nuclear  perfusion study reveals good exercise tolerance with a workload of 13.7 mets, and he had no ST segment deviation during exercise or exercise induced ectopy, and had entirely normal perfusion.  His echo Doppler study reveals normal systolic function with normal diastolic parameters.  He does not have significant obstructive sleep apnea as noted on his sleep study.  He continues to have significant hypersomnolence, but I suspect this most likely is related to his inadequate sleep duration.  He states that most he sleeps 5 hours per night, but oftentimes may only sleep 3 or 4 hours per night.  Since he is often caring for his son, he gets up early.  He does not "ketchup "sleep on the weekends.  I have had a long discussion with him concerning adverse consequences regarding inadequate sleep duration and particularly with increased mortality associated with sleep duration less than 5 hours.  We also discussed decreased cognitive ability and memory effects.  I have recommended he sleep for minimum of 72 ideally 8 hours per night.  If he continues to experience hypersomnolence and daytime sleepiness.  I will then recommend he be referred for a multiple latency sleep test to evaluate for idiopathic hyper insomnia or narcolepsy.  We discussed weight loss and continued exercise.  I will see him in 6 months for reevaluation.    Medication Adjustments/Labs and Tests Ordered: Current medicines are reviewed at length with the patient today.  Concerns regarding medicines are outlined above.  Medication changes, Labs and Tests ordered today are listed in the Patient Instructions below. Patient Instructions  Medication Instructions:  Your physician recommends that you  continue on your current medications as directed. Please refer to the Current Medication list given to you today.  Follow-Up: Your physician wants you to follow-up in: 6 months with Dr. Tresa Endo. You will receive a reminder letter in the mail two months in advance. If you don't receive a letter, please call our office to schedule the follow-up appointment.   Any Other Special Instructions Will Be Listed Below (If Applicable).     If you need a refill on your cardiac medications before your next appointment, please call your pharmacy.      Signed, Nathaniel Guadalajara, MD  10/12/2017 5:24 PM    [facility] [address], [place], [place]  [address] Phone: [phone]

## 2017-10-11 NOTE — Patient Instructions (Signed)

## 2017-10-12 ENCOUNTER — Encounter: Payer: Self-pay | Admitting: Cardiovascular Disease

## 2018-01-05 DIAGNOSIS — J111 Influenza due to unidentified influenza virus with other respiratory manifestations: Secondary | ICD-10-CM | POA: Diagnosis not present

## 2018-01-14 DIAGNOSIS — J019 Acute sinusitis, unspecified: Secondary | ICD-10-CM | POA: Diagnosis not present

## 2018-01-14 DIAGNOSIS — R05 Cough: Secondary | ICD-10-CM | POA: Diagnosis not present

## 2018-04-16 DIAGNOSIS — S0502XA Injury of conjunctiva and corneal abrasion without foreign body, left eye, initial encounter: Secondary | ICD-10-CM | POA: Diagnosis not present

## 2018-04-16 DIAGNOSIS — H10413 Chronic giant papillary conjunctivitis, bilateral: Secondary | ICD-10-CM | POA: Diagnosis not present

## 2018-04-16 DIAGNOSIS — H25812 Combined forms of age-related cataract, left eye: Secondary | ICD-10-CM | POA: Diagnosis not present

## 2018-04-24 DIAGNOSIS — H25812 Combined forms of age-related cataract, left eye: Secondary | ICD-10-CM | POA: Diagnosis not present

## 2018-04-24 DIAGNOSIS — H10413 Chronic giant papillary conjunctivitis, bilateral: Secondary | ICD-10-CM | POA: Diagnosis not present

## 2018-04-26 DIAGNOSIS — B999 Unspecified infectious disease: Secondary | ICD-10-CM | POA: Diagnosis not present

## 2018-05-17 ENCOUNTER — Ambulatory Visit (INDEPENDENT_AMBULATORY_CARE_PROVIDER_SITE_OTHER): Payer: 59 | Admitting: Orthopaedic Surgery

## 2018-05-17 ENCOUNTER — Encounter (INDEPENDENT_AMBULATORY_CARE_PROVIDER_SITE_OTHER): Payer: Self-pay | Admitting: Orthopaedic Surgery

## 2018-05-17 ENCOUNTER — Other Ambulatory Visit (INDEPENDENT_AMBULATORY_CARE_PROVIDER_SITE_OTHER): Payer: Self-pay

## 2018-05-17 ENCOUNTER — Ambulatory Visit (INDEPENDENT_AMBULATORY_CARE_PROVIDER_SITE_OTHER): Payer: Self-pay

## 2018-05-17 ENCOUNTER — Telehealth (INDEPENDENT_AMBULATORY_CARE_PROVIDER_SITE_OTHER): Payer: Self-pay

## 2018-05-17 DIAGNOSIS — G8929 Other chronic pain: Secondary | ICD-10-CM | POA: Diagnosis not present

## 2018-05-17 DIAGNOSIS — M1711 Unilateral primary osteoarthritis, right knee: Secondary | ICD-10-CM

## 2018-05-17 DIAGNOSIS — M1712 Unilateral primary osteoarthritis, left knee: Secondary | ICD-10-CM

## 2018-05-17 DIAGNOSIS — M25561 Pain in right knee: Secondary | ICD-10-CM | POA: Diagnosis not present

## 2018-05-17 DIAGNOSIS — M25562 Pain in left knee: Secondary | ICD-10-CM

## 2018-05-17 MED ORDER — LIDOCAINE HCL 1 % IJ SOLN
3.0000 mL | INTRAMUSCULAR | Status: AC | PRN
  Administered 2018-05-17: 3 mL

## 2018-05-17 MED ORDER — METHYLPREDNISOLONE ACETATE 40 MG/ML IJ SUSP
40.0000 mg | INTRAMUSCULAR | Status: AC | PRN
  Administered 2018-05-17: 40 mg via INTRA_ARTICULAR

## 2018-05-17 NOTE — Progress Notes (Signed)
Office Visit Note   Patient: Nathaniel Jones           Date of Birth: 01/27/1981           MRN: [id number] Visit Date: 05/17/2018              Requested by: Clovis RileyMitchell, L.August Saucerean, MD [address] [place], [place] [address] PCP: Clovis RileyMitchell, L.August Saucerean, MD   Assessment & Plan: Visit Diagnoses:  1. Chronic pain of both knees   2. Unilateral primary osteoarthritis, left knee   3. Unilateral primary osteoarthritis, right knee     Plan: At this point I definitely feel it is worth him having steroid injections in both knees.  We should also follow this up with hyaluronic acid injections in both knees in about 4 weeks from now.  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  Hopefully this will take pressure off his spine as well.  An MRI in the past showed herniated disc at L4-L5 the left side and he had a epidural steroid injection.  We may pursue that route if his symptoms do not lessen.  We will see him back in 4 weeks to place hyaluronic acid in both knees.  Follow-Up Instructions: Return in about 4 weeks (around 06/14/2018).   Orders:  Orders Placed This Encounter  Procedures  . XR Knee 1-2 Views Left  . XR Knee 1-2 Views Right   No orders of the defined types were placed in this encounter.     Procedures: Large Joint Inj: R knee on 05/17/2018 3:24 PM Indications: diagnostic evaluation and pain Details: 22 G 1.5 in needle, superolateral approach  Arthrogram: No  Medications: 3 mL lidocaine 1 %; 40 mg methylPREDNISolone acetate 40 MG/ML Outcome: tolerated well, no immediate complications Procedure, treatment alternatives, risks and benefits explained, specific risks discussed. Consent was given by the patient. Immediately prior to procedure a time out was called to verify the correct patient, procedure, equipment, support staff and site/side marked as required. Patient was prepped and draped in the usual sterile fashion.   Large Joint Inj: L  knee on 05/17/2018 3:24 PM Indications: diagnostic evaluation and pain Details: 22 G 1.5 in needle, superolateral approach  Arthrogram: No  Medications: 3 mL lidocaine 1 %; 40 mg methylPREDNISolone acetate 40 MG/ML Outcome: tolerated well, no immediate complications Procedure, treatment alternatives, risks and benefits explained, specific risks discussed. Consent was given by the patient. Immediately prior to procedure a time out was called to verify the correct patient, procedure, equipment, support staff and site/side marked as required. Patient was prepped and draped in the usual sterile fashion.       Clinical Data: No additional findings.   Subjective: Chief Complaint  Patient presents with  . Left Knee - Pain  . Right Knee - Pain  Patient is well-known to me.  He is a very athletic 37 year old gentleman that we have been seeing for a long period of time.  He started to develop bad knee pain again on both knees with the left worse than the right.  We have seen him before for these knees.  He is actually had an arthroscopic intervention on the left knee.  He is also been having some thigh numbness on his left side.  Is had some low back issues.  He is someone who is very tall solid individual but not obese.  He is been doing kickboxing as well.  He does work 2 jobs and a second job he is on concrete anywhere from 5 to 7 hours a night.  HPI  Review of Systems He currently denies any headache, chest pain, shortness of breath, fever, chills, nausea, vomiting.  Objective: Vital Signs: There were no vitals taken for this visit.  Physical Exam He is alert and oriented x3 and in no acute distress Ortho Exam Examination of both knees show varus malalignment with medial joint line tenderness with no effusion.  Both knees have patellofemoral crepitation with good range of motion.  His left side hurts over the IT band as well around the knee joint and just proximal to the knee itself.  He  also has subjective thigh numbness. Specialty Comments:  No specialty comments available.  Imaging: Xr Knee 1-2 Views Left  Result Date: 05/17/2018 2 views of the left knee show significant varus malalignment with medial joint space narrowing.  There is also significant patellofemoral arthritic changes.  Xr Knee 1-2 Views Right  Result Date: 05/17/2018 2 views of the right knee show significant medial joint line narrowing with varus malalignment.  There is significant patellofemoral arthritic changes.    PMFS History: Patient Active Problem List   Diagnosis Date Noted  . Unilateral primary osteoarthritis, left knee 05/17/2018  . Unilateral primary osteoarthritis, right knee 05/17/2018  . Chronic pain of both knees 05/17/2018  . Biceps rupture, proximal, right, initial encounter 05/22/2017  . Chronic bilateral low back pain without sciatica 03/08/2017  . Lateral epicondylitis, right elbow 03/08/2017  . It band syndrome, left 03/08/2017   Past Medical History:  Diagnosis Date  . Hypertension     Family History  Problem Relation Age of Onset  . Hypertension Unknown   . Diabetes Unknown   . Hypertension Mother   . Diabetes Mother   . Hypertension Father   . Diabetes Brother   . Stroke Maternal Grandmother   . Diabetes Maternal Grandmother   . High Cholesterol Maternal Grandmother   . Stroke Maternal Grandfather     Past Surgical History:  Procedure Laterality Date  . KNEE SURGERY Right   . none     Social History   Occupational History  . Occupation: recreation Runner, broadcasting/film/videocener    Employer: UNEMPLOYED  Tobacco Use  . Smoking status: Never Smoker  . Smokeless tobacco: Never Used  Substance and Sexual Activity  . Alcohol use: No  . Drug use: No  . Sexual activity: Not on file

## 2018-05-17 NOTE — Telephone Encounter (Signed)
Bilateral knee injections  

## 2018-05-20 NOTE — Telephone Encounter (Signed)
Submitted [employer]. Please followup, thanks

## 2018-05-21 NOTE — Telephone Encounter (Signed)
Noted  

## 2018-05-31 DIAGNOSIS — M545 Low back pain: Secondary | ICD-10-CM | POA: Diagnosis not present

## 2018-06-01 ENCOUNTER — Ambulatory Visit (INDEPENDENT_AMBULATORY_CARE_PROVIDER_SITE_OTHER): Payer: 59 | Admitting: Family Medicine

## 2018-06-08 ENCOUNTER — Telehealth (INDEPENDENT_AMBULATORY_CARE_PROVIDER_SITE_OTHER): Payer: Self-pay

## 2018-06-08 NOTE — Telephone Encounter (Signed)
Patient is approved for SynviscOne, bilateral knee. Buy & Bill Covered at 80% after deductible has been met. Patient will be responsible for 20% OOP. No co-pay No PA required  Appt.scheduled 06/14/2018

## 2018-06-14 ENCOUNTER — Encounter (INDEPENDENT_AMBULATORY_CARE_PROVIDER_SITE_OTHER): Payer: Self-pay | Admitting: Orthopaedic Surgery

## 2018-06-14 ENCOUNTER — Ambulatory Visit (INDEPENDENT_AMBULATORY_CARE_PROVIDER_SITE_OTHER): Payer: 59 | Admitting: Orthopaedic Surgery

## 2018-06-14 DIAGNOSIS — M1711 Unilateral primary osteoarthritis, right knee: Secondary | ICD-10-CM

## 2018-06-14 DIAGNOSIS — M1712 Unilateral primary osteoarthritis, left knee: Secondary | ICD-10-CM | POA: Diagnosis not present

## 2018-06-14 MED ORDER — HYLAN G-F 20 48 MG/6ML IX SOSY
48.0000 mg | PREFILLED_SYRINGE | INTRA_ARTICULAR | Status: AC | PRN
  Administered 2018-06-14: 48 mg via INTRA_ARTICULAR

## 2018-06-14 NOTE — Progress Notes (Signed)
   Procedure Note  Patient: Nathaniel Jones             Date of Birth: 10/05/1980           MRN: [id number]             Visit Date: 06/14/2018  Procedures: Visit Diagnoses: Unilateral primary osteoarthritis, left knee  Unilateral primary osteoarthritis, right knee  Large Joint Inj: R knee on 06/14/2018 3:02 PM Indications: pain and diagnostic evaluation Details: 22 G 1.5 in needle, superolateral approach  Arthrogram: No  Medications: 48 mg Hylan 48 MG/6ML Outcome: tolerated well, no immediate complications Procedure, treatment alternatives, risks and benefits explained, specific risks discussed. Consent was given by the patient. Immediately prior to procedure a time out was called to verify the correct patient, procedure, equipment, support staff and site/side marked as required. Patient was prepped and draped in the usual sterile fashion.   Large Joint Inj: L knee on 06/14/2018 3:02 PM Indications: pain and diagnostic evaluation Details: 22 G 1.5 in needle, superolateral approach  Arthrogram: No  Medications: 48 mg Hylan 48 MG/6ML Outcome: tolerated well, no immediate complications Procedure, treatment alternatives, risks and benefits explained, specific risks discussed. Consent was given by the patient. Immediately prior to procedure a time out was called to verify the correct patient, procedure, equipment, support staff and site/side marked as required. Patient was prepped and draped in the usual sterile fashion.    Patient comes in today for scheduled Synvisc 1 injections in both his knees to treat the pain from moderate arthritis.  He is only 37 years old.  He is working on activity modification and weight loss.  He is been doing resistance bands and he can tell a difference in his weight for sure.  Both knees have medial joint tenderness and known varus malalignment.  He has moderate arthritis in both knees.  He understands the rationale behind trying hyaluronic acid for his  knees.  We placed Synvisc 1 in both knees which he tolerated well.  All question concerns were answered and addressed.  Follow-up will be as needed.

## 2018-06-30 DIAGNOSIS — M25579 Pain in unspecified ankle and joints of unspecified foot: Secondary | ICD-10-CM | POA: Diagnosis not present

## 2018-07-10 DIAGNOSIS — Z7721 Contact with and (suspected) exposure to potentially hazardous body fluids: Secondary | ICD-10-CM | POA: Diagnosis not present

## 2018-09-03 ENCOUNTER — Ambulatory Visit (INDEPENDENT_AMBULATORY_CARE_PROVIDER_SITE_OTHER): Payer: 59 | Admitting: Physician Assistant

## 2018-09-03 ENCOUNTER — Ambulatory Visit (INDEPENDENT_AMBULATORY_CARE_PROVIDER_SITE_OTHER): Payer: Self-pay

## 2018-09-03 ENCOUNTER — Ambulatory Visit (INDEPENDENT_AMBULATORY_CARE_PROVIDER_SITE_OTHER): Payer: 59 | Admitting: Orthopaedic Surgery

## 2018-09-03 ENCOUNTER — Encounter (INDEPENDENT_AMBULATORY_CARE_PROVIDER_SITE_OTHER): Payer: Self-pay | Admitting: Orthopaedic Surgery

## 2018-09-03 DIAGNOSIS — M25562 Pain in left knee: Secondary | ICD-10-CM | POA: Diagnosis not present

## 2018-09-03 DIAGNOSIS — M79671 Pain in right foot: Secondary | ICD-10-CM

## 2018-09-03 DIAGNOSIS — G8929 Other chronic pain: Secondary | ICD-10-CM

## 2018-09-03 MED ORDER — DICLOFENAC SODIUM 1 % TD GEL: 2.0000 g | Freq: Four times a day (QID) | TRANSDERMAL | 3 refills | Status: DC

## 2018-09-03 MED ORDER — LIDOCAINE HCL 1 % IJ SOLN
3.0000 mL | INTRAMUSCULAR | Status: AC | PRN
  Administered 2018-09-03: 3 mL

## 2018-09-03 MED ORDER — METHYLPREDNISOLONE ACETATE 40 MG/ML IJ SUSP
40.0000 mg | INTRAMUSCULAR | Status: AC | PRN
  Administered 2018-09-03: 40 mg via INTRA_ARTICULAR

## 2018-09-03 NOTE — Progress Notes (Signed)
Office Visit Note   Patient: Nathaniel Jones           Date of Birth: Apr 30, 1981           MRN: [id number] Visit Date: 09/03/2018              Requested by: [name], [name], MD [address] [place], [place] [address] PCP: [name], [name], MD   Assessment & Plan: Visit Diagnoses:  1. Pain in right foot   2. Chronic pain of left knee     Plan: As far as his foot goes, he is going to work on activity modification and hold back on kickboxing.  I will try some Voltaren gel and oral anti-inflammatories.  I did place a steroid injection in his left knee to see if this would help his symptoms.  All questions concerns were answered and addressed.  Follow-up will be as needed.  Follow-Up Instructions: Return if symptoms worsen or fail to improve.   Orders:  Orders Placed This Encounter  Procedures  . Large Joint Inj  . XR Foot Complete Right   Meds ordered this encounter  Medications  . diclofenac sodium (VOLTAREN) 1 % GEL    Sig: Apply 2 g topically 4 (four) times daily.    Dispense:  100 g    Refill:  3      Procedures: Large Joint Inj: L knee on 09/03/2018 3:36 PM Indications: diagnostic evaluation and pain Details: 22 G 1.5 in needle, superolateral approach  Arthrogram: No  Medications: 3 mL lidocaine 1 %; 40 mg methylPREDNISolone acetate 40 MG/ML Outcome: tolerated well, no immediate complications Procedure, treatment alternatives, risks and benefits explained, specific risks discussed. Consent was given by the patient. Immediately prior to procedure a time out was called to verify the correct patient, procedure, equipment, support staff and site/side marked as required. Patient was prepped and draped in the usual sterile fashion.       Clinical Data: No additional findings.   Subjective: Chief Complaint  Patient presents with  . Left Knee - Pain  Patient is well-known to me.  He is an active 37 year old gentleman with known arthritis of his left  and right knees.  He had hyaluronic acid injections almost 3 months ago on both knees.  His left knee is flared up on him and he like to have injection at left knee.  The right foot is been hurting him quite a bit.  He is actually someone who had 1/5 metatarsal fracture years ago and this was well documented in 2009.  To have his x-rays still.  He has been hurting at the first MTP joint but mainly of the fifth ray at the MTP joint of his fifth toe.  The left knee feels like it swollen to him and has been aching.  He does a lot of kickboxing as well.  He has been trying to lose weight.  HPI  Review of Systems He currently denies any acute medical issues. Objective: Vital Signs: There were no vitals taken for this visit.  Physical Exam Is alert orient x3 and in no acute distress Ortho Exam Examination of his left knee shows no effusion but medial lateral joint line tenderness and patellofemoral crepitation with good range of motion.  The left knee is ligaments stable but painful.  Examination of his right foot shows pain over the fifth metatarsal almost as if he has a bunionette and this is at the metatarsal head.  He has pain  at the MTP joint of the first toe but full range of motion.  His arch appears normal.  His foot is well-perfused and neurovascularly intact. Specialty Comments:  No specialty comments available.  Imaging: Xr Foot Complete Right  Result Date: 09/03/2018 3 views of the right foot show no acute findings.  There is arthritic changes at the first MTP joint.  There is flattening of the second and third metatarsal heads.  There is prominence of the fifth metatarsal head.  The midfoot appears normal.  He has normal-appearing arch.    PMFS History: Patient Active Problem List   Diagnosis Date Noted  . Unilateral primary osteoarthritis, left knee 05/17/2018  . Unilateral primary osteoarthritis, right knee 05/17/2018  . Chronic pain of both knees 05/17/2018  . Biceps rupture,  proximal, right, initial encounter 05/22/2017  . Chronic bilateral low back pain without sciatica 03/08/2017  . Lateral epicondylitis, right elbow 03/08/2017  . It band syndrome, left 03/08/2017   Past Medical History:  Diagnosis Date  . Hypertension     Family History  Problem Relation Age of Onset  . Hypertension Unknown   . Diabetes Unknown   . Hypertension Mother   . Diabetes Mother   . Hypertension Father   . Diabetes Brother   . Stroke Maternal Grandmother   . Diabetes Maternal Grandmother   . High Cholesterol Maternal Grandmother   . Stroke Maternal Grandfather     Past Surgical History:  Procedure Laterality Date  . KNEE SURGERY Right   . none     Social History   Occupational History  . Occupation: recreation Runner, broadcasting/film/videocener    Employer: UNEMPLOYED  Tobacco Use  . Smoking status: Never Smoker  . Smokeless tobacco: Never Used  Substance and Sexual Activity  . Alcohol use: No  . Drug use: No  . Sexual activity: Not on file

## 2018-10-16 DIAGNOSIS — R7303 Prediabetes: Secondary | ICD-10-CM | POA: Diagnosis not present

## 2018-10-16 DIAGNOSIS — Z23 Encounter for immunization: Secondary | ICD-10-CM | POA: Diagnosis not present

## 2018-10-16 DIAGNOSIS — Z0001 Encounter for general adult medical examination with abnormal findings: Secondary | ICD-10-CM | POA: Diagnosis not present

## 2018-10-28 DIAGNOSIS — R6889 Other general symptoms and signs: Secondary | ICD-10-CM | POA: Diagnosis not present

## 2018-10-28 DIAGNOSIS — Z20828 Contact with and (suspected) exposure to other viral communicable diseases: Secondary | ICD-10-CM | POA: Diagnosis not present

## 2018-10-31 DIAGNOSIS — J019 Acute sinusitis, unspecified: Secondary | ICD-10-CM | POA: Diagnosis not present

## 2018-10-31 DIAGNOSIS — J029 Acute pharyngitis, unspecified: Secondary | ICD-10-CM | POA: Diagnosis not present

## 2018-11-29 ENCOUNTER — Ambulatory Visit: Payer: 59 | Admitting: Neurology

## 2018-11-29 ENCOUNTER — Encounter: Payer: Self-pay | Admitting: Neurology

## 2018-11-29 ENCOUNTER — Telehealth: Payer: Self-pay | Admitting: Neurology

## 2018-11-29 VITALS — BP 149/83 | HR 67 | Ht 76.0 in | Wt 318.0 lb

## 2018-11-29 DIAGNOSIS — R413 Other amnesia: Secondary | ICD-10-CM

## 2018-11-29 DIAGNOSIS — R400 Somnolence: Secondary | ICD-10-CM

## 2018-11-29 DIAGNOSIS — G478 Other sleep disorders: Secondary | ICD-10-CM

## 2018-11-29 DIAGNOSIS — Z72820 Sleep deprivation: Secondary | ICD-10-CM

## 2018-11-29 DIAGNOSIS — R6889 Other general symptoms and signs: Secondary | ICD-10-CM | POA: Diagnosis not present

## 2018-11-29 DIAGNOSIS — R419 Unspecified symptoms and signs involving cognitive functions and awareness: Secondary | ICD-10-CM

## 2018-11-29 NOTE — Progress Notes (Signed)
Subjective:    Patient ID: Nathaniel Jones is a 38 y.o. male.  HPI     Huston Foley, MD, PhD [facility] [address], [address] [address] [place], [place] [address]  Dear Dr. [name],  I saw your patient, Nathaniel Jones, upon your kind request in my sleep clinic today for initial consultation of his sleep disorder, and cognitive issues. The patient is unaccompanied today. As you know, Nathaniel Jones is a 38 year old right-handed gentleman with an underlying medical history of shoulder pain, low back pain, constipation, prediabetes, ED, and obesity, who reports feeling very sleepy during the day for the past 2 or 3 years. His Epworth sleepiness score is 21 out of 2014 today, fatigue score is 51 out of 63. He denies a family history of narcolepsy. He has fallen asleep inadvertently when briefly sedentary and even at the wheel he admits. This was when he was working 2 jobs he states and he was getting very little sleep at the time. He currently works from 8:30 to 4:30. 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 He lives at home with his 62-year-old son who stays with him half time and the other half with her mom. Patient is a nonsmoker and utilizes alcohol rarely, such as once a year or so and drinks caffeine in the form of soda, 2 or 3 cans per day on average. 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 He therefore does stay very busy. He is trying to lose weight and has lost about 15 pounds thus far. He does exercise regularly including kickboxing, weight lifting and basketball. He reports very vivid dreams, he has had instances of sleep paralysis. He denies clear-cut cataplectic episodes. He does not typically snore. Bedtime is typically 10:30 or 11 but can be as late as 1 or 2. Rise time when he has his son is 5:30 and on those other days is around 7:30. He denies night to night nocturia. He may  have tried prescription sleep aids in the past but does not recall their names. He was on trazodone at the time of his sleep study in the past. He does have difficulty falling asleep and staying asleep. Sometimes he feels irritable. He denies any frank or severe depression. He has experienced forgetfulness and worries about memory loss, he has had word finding difficulty at times, he denies a family history of dementia. He had a head CT in 2016 cause of a palpable scalp mass but it was a benign scan. I reviewed your office note from 10/16/2018, which you kindly included. MMSE was 30 out of 30 at the time, labs in your office showed A1c of 5.8, CMP unremarkable. He had a sleep study in December 2018 which I reviewed. His Epworth sleepiness score was 16 at the time. Of note, he was on several potentially sedating medications at this time including Flexeril, gabapentin, trazodone. He did take trazodone at the time of the study. Sleep efficiency was 57.7%. Total AHI was 2.3 per hour, he achieved 12.8% of REM sleep. REM AHI was 4.5 per hour. O2 nadir was 91%. Soft snoring was noted. He no longer takes trazodone. In the past 2 months he has not been on Seroquel. He was taking 12.5 mg each night and reports that it does help him fall asleep. He is no longer taking Flexeril or gabapentin which  was for back pain.  His Past Medical History Is Significant For: Past Medical History:  Diagnosis Date  . Hypertension     His Past Surgical History Is Significant For: Past Surgical History:  Procedure Laterality Date  . KNEE SURGERY Right   . none      His Family History Is Significant For: Family History  Problem Relation Age of Onset  . Hypertension Unknown   . Diabetes Unknown   . Hypertension Mother   . Diabetes Mother   . Hypertension Father   . Diabetes Brother   . Stroke Maternal Grandmother   . Diabetes Maternal Grandmother   . High Cholesterol Maternal Grandmother   . Stroke Maternal Grandfather      His Social History Is Significant For: Social History   Socioeconomic History  . Marital status: Single    Spouse name: Not on file  . Number of children: 0  . Years of education: Not on file  . Highest education level: Not on file  Occupational History  . Occupation: recreation Runner, broadcasting/film/videocener    Employer: UNEMPLOYED  Social Needs  . Financial resource strain: Not on file  . Food insecurity:    Worry: Not on file    Inability: Not on file  . Transportation needs:    Medical: Not on file    Non-medical: Not on file  Tobacco Use  . Smoking status: Never Smoker  . Smokeless tobacco: Never Used  Substance and Sexual Activity  . Alcohol use: No  . Drug use: No  . Sexual activity: Not on file  Lifestyle  . Physical activity:    Days per week: Not on file    Minutes per session: Not on file  . Stress: Not on file  Relationships  . Social connections:    Talks on phone: Not on file    Gets together: Not on file    Attends religious service: Not on file    Active member of club or organization: Not on file    Attends meetings of clubs or organizations: Not on file    Relationship status: Not on file  Other Topics Concern  . Not on file  Social History Narrative  . Not on file    His Allergies Are:  Allergies  Allergen Reactions  . Ibuprofen   :   His Current Medications Are:  Outpatient Encounter Medications as of 11/29/2018  Medication Sig  . cyclobenzaprine (FLEXERIL) 10 MG tablet Take 10 mg by mouth 3 (three) times daily as needed.   . diclofenac (VOLTAREN) 75 MG EC tablet Take 75 mg by mouth 4 (four) times daily.   . diclofenac sodium (VOLTAREN) 1 % GEL Apply 2 g topically 4 (four) times daily.  . QUEtiapine (SEROQUEL) 25 MG tablet Take 25 mg by mouth daily.  . [DISCONTINUED] gabapentin (NEURONTIN) 300 MG capsule TAKE 1 CAPSULE BY MOUTH DAILY AT BEDTIME FOR NERVE PAIN  . [DISCONTINUED] traZODone (DESYREL) 50 MG tablet Take 50 mg by mouth at bedtime as needed.     No facility-administered encounter medications on file as of 11/29/2018.   :  Review of Systems:  Out of a complete 14 point review of systems, all are reviewed and negative with the exception of these symptoms as listed below: Review of Systems  Neurological:       Pt presents today to discuss his sleep. Pt reports that he had a recent sleep study and does not need a cpap. Pt reports that he "crashes" around  3pm and sometimes will have to pull over if he is driving. Pt reports that he sometimes will not remember conversations that he has had with people.  Epworth Sleepiness Scale 0= would never doze 1= slight chance of dozing 2= moderate chance of dozing 3= high chance of dozing  Sitting and reading: 1 Watching TV: 3 Sitting inactive in a public place (ex. Theater or meeting): 3 As a passenger in a car for an hour without a break: 2 Lying down to rest in the afternoon: 3 Sitting and talking to someone: 3 Sitting quietly after lunch (no alcohol): 3 In a car, while stopped in traffic: 3 Total: 21     Objective:  Neurological Exam  Physical Exam Physical Examination:   Vitals:   11/29/18 0857  BP: (!) 149/83  Pulse: 67    General Examination: The patient is a very pleasant 38 y.o. male in no acute distress. He appears well-developed and well-nourished and well groomed.   HEENT: Normocephalic, atraumatic, pupils are equal, round and reactive to light and accommodation. Funduscopic exam is normal with sharp disc margins noted. Extraocular tracking is good without limitation to gaze excursion or nystagmus noted. Normal smooth pursuit is noted. Hearing is grossly intact. Face is symmetric with normal facial animation and normal facial sensation. Speech is clear with no dysarthria noted. There is no hypophonia. There is no lip, neck/head, jaw or voice tremor. Neck is supple with full range of passive and active motion. There are no carotid bruits on auscultation. Oropharynx exam  reveals: mild mouth dryness, good dental hygiene and mild airway crowding, due to smaller airway entry and wider uvula. Tonsils are 1+ bilaterally. Mallampati is class II. Tongue protrudes centrally and palate elevates symmetrically. Neck circumference is 18-1/4 inches.  Chest: Clear to auscultation without wheezing, rhonchi or crackles noted.  Heart: S1+S2+0, regular and normal without murmurs, rubs or gallops noted.   Abdomen: Soft, non-tender and non-distended with normal bowel sounds appreciated on auscultation.  Extremities: There is no pitting edema in the distal lower extremities bilaterally. Pedal pulses are intact.  Skin: Warm and dry without trophic changes noted.  Musculoskeletal: exam reveals no obvious joint deformities, tenderness or joint swelling or erythema.   Neurologically:  Mental status: The patient is awake, alert and oriented in all 4 spheres. His immediate and remote memory, attention, language skills and fund of knowledge are appropriate. There is no evidence of aphasia, agnosia, apraxia or anomia. Speech is clear with normal prosody and enunciation. Thought process is linear. Mood is normal and affect is normal.  Cranial nerves II - XII are as described above under HEENT exam. In addition: shoulder shrug is normal with equal shoulder height noted. Motor exam: Normal bulk, strength and tone is noted. There is no drift, tremor or rebound. Romberg is negative. Fine motor skills and coordination: grossly intact.  Cerebellar testing: No dysmetria or intention tremor on finger to nose testing. Heel to shin is unremarkable bilaterally. There is no truncal or gait ataxia.  Sensory exam: intact to light touch in the upper and lower extremities.  Gait, station and balance: He stands easily. No veering to one side is noted. No leaning to one side is noted. Posture is age-appropriate and stance is narrow based. Gait shows normal stride length and normal pace. No problems turning are  noted. Tandem walk is unremarkable.   Assessment and Plan:  In summary, Nathaniel Jones is a 38 year old right-handed gentleman with an underlying medical history of shoulder pain, low  back pain, constipation, prediabetes, ED, and obesity, who presents for evaluation of his significant daytime somnolence as well as cognitive complaints. His neurological exam is nonfocal thankfully. He is reassured in that regard. He had a recent normal MMSE in your office. He had a head CT which was unremarkable in August 2016. I would like to proceed with additional testing in the form of nighttime sleep study, followed by a daytime sleep study format nap testing. This would help [place] further delineate whether or not he has a true underlying sleepiness disorder. Narcolepsy without cataplexy and idiopathic hypersomnolence would be in the differential diagnosis. A sleep study from December 2018 showed no significant sleep disordered breathing. I would also like to proceed with a brain MRI with and without contrast to rule out any structural cause of his memory complaints. He is advised to continue to stay off the Seroquel in preparation for sleep study testing. Unfortunately, cumulative sleep deprivation may also be at play. He is advised to make more time for sleep, try to limit his soda intake to 1-2 servings per day, keep a set schedule for his sleep. We talked about the importance of maintaining a healthy lifestyle. He is encouraged to continue to try to lose weight and continue with physical activity. He is advised to be cautious with his driving as he has been very sleepy. He is advised not to drive when feeling very sleepy.I will see him back after testing is completed with a brain scan and sleep studies. We will keep them posted by phone call in the interim as well. I answered all his questions today and he was in agreement. Thank you very much for allowing me to participate in the care of this nice patient. If I can be of any  further assistance to you please do not hesitate to call me at [phone].  Sincerely,   Huston Foley, MD, PhD

## 2018-11-29 NOTE — Telephone Encounter (Signed)
[employer] Auth: NPR via [employer] website order sent to GI. They will reach out to the pt to schedule.  °

## 2018-11-29 NOTE — Patient Instructions (Addendum)
I believe you may have an underlying sleepiness condition. This means, that you may have a sleep disorder that manifests with excessive sleep and excessive sleepiness during the day.  We will do additional testing at this point: I would like for you to come back for an overnight sleep study during which we will monitor your night time sleep and we will do nap study testing the next day: 5 scheduled 20 min nap opportunities, every 2 hours. We will remind you to stay awake in between naps.   As explained, you will have to be off of any extra caffeine or stimulant or antidepressant medication in preparation for the sleep studies. You have to stay off the Seroquel.  Keep your soda intake at 1-2 servings. Try to keep a set schedule for your sleep. Try to get 7-8 hours of sleep.  We will do a brain scan, called MRI and call you with the test results. We will have to schedule you for this on a separate date. This test requires authorization from your insurance, and we will take care of the insurance process.

## 2018-12-06 DIAGNOSIS — T07XXXA Unspecified multiple injuries, initial encounter: Secondary | ICD-10-CM | POA: Diagnosis not present

## 2018-12-06 DIAGNOSIS — M5430 Sciatica, unspecified side: Secondary | ICD-10-CM | POA: Diagnosis not present

## 2018-12-06 DIAGNOSIS — M545 Low back pain: Secondary | ICD-10-CM | POA: Diagnosis not present

## 2018-12-08 ENCOUNTER — Other Ambulatory Visit: Payer: 59

## 2018-12-09 ENCOUNTER — Ambulatory Visit
Admission: RE | Admit: 2018-12-09 | Discharge: 2018-12-09 | Disposition: A | Discharge status: Home / Self Care | Payer: 59 | Source: Ambulatory Visit | Attending: Neurology | Admitting: Neurology

## 2018-12-09 DIAGNOSIS — R400 Somnolence: Secondary | ICD-10-CM

## 2018-12-09 DIAGNOSIS — Z72820 Sleep deprivation: Secondary | ICD-10-CM

## 2018-12-09 DIAGNOSIS — R6889 Other general symptoms and signs: Secondary | ICD-10-CM

## 2018-12-09 DIAGNOSIS — R413 Other amnesia: Secondary | ICD-10-CM | POA: Diagnosis not present

## 2018-12-09 DIAGNOSIS — G478 Other sleep disorders: Secondary | ICD-10-CM

## 2018-12-09 DIAGNOSIS — R419 Unspecified symptoms and signs involving cognitive functions and awareness: Secondary | ICD-10-CM | POA: Diagnosis not present

## 2018-12-09 MED ORDER — GADOBENATE DIMEGLUMINE 529 MG/ML IV SOLN
20.0000 mL | Freq: Once | INTRAVENOUS | Status: AC | PRN
  Administered 2018-12-09: 20 mL via INTRAVENOUS

## 2018-12-10 ENCOUNTER — Telehealth: Payer: Self-pay

## 2018-12-10 NOTE — Telephone Encounter (Signed)
-----   Message from Huston Foley, MD sent at 12/10/2018  2:50 PM EDT ----- Please call and advise the patient that the recent scan we did was within normal limits. We did a brain MRI without and with contrast, which showed normal findings. In particular, there were no acute findings, such as a stroke, or mass or blood products. No further action is required on this test at this time. Please remind patient to keep any upcoming appointments or tests and to call us with any interim questions, concerns, problems or updates. Thanks,  Huston Foley, MD, PhD

## 2018-12-10 NOTE — Progress Notes (Signed)
Please call and advise the patient that the recent scan we did was within normal limits. We did a brain MRI without and with contrast, which showed normal findings. In particular, there were no acute findings, such as a stroke, or mass or blood products. No further action is required on this test at this time. Please remind patient to keep any upcoming appointments or tests and to call us with any interim questions, concerns, problems or updates. Thanks,  Huston Foley, MD, PhD

## 2018-12-10 NOTE — Telephone Encounter (Signed)
I called pt and discussed his MRI results. Pt has not heard from our sleep lab about scheduling his sleep study. Pt verbalized understanding of results. Pt had no questions at this time but was encouraged to call back if questions arise.

## 2019-01-28 DIAGNOSIS — B349 Viral infection, unspecified: Secondary | ICD-10-CM | POA: Diagnosis not present

## 2019-02-07 DIAGNOSIS — S46212A Strain of muscle, fascia and tendon of other parts of biceps, left arm, initial encounter: Secondary | ICD-10-CM | POA: Diagnosis not present

## 2019-02-21 ENCOUNTER — Ambulatory Visit: Payer: 59 | Admitting: Orthopaedic Surgery

## 2019-02-21 ENCOUNTER — Ambulatory Visit: Payer: Self-pay

## 2019-02-21 ENCOUNTER — Other Ambulatory Visit: Payer: Self-pay

## 2019-02-21 ENCOUNTER — Encounter: Payer: Self-pay | Admitting: Orthopaedic Surgery

## 2019-02-21 DIAGNOSIS — M79672 Pain in left foot: Secondary | ICD-10-CM

## 2019-02-21 DIAGNOSIS — M25512 Pain in left shoulder: Secondary | ICD-10-CM

## 2019-02-21 MED ORDER — LIDOCAINE HCL 1 % IJ SOLN
3.0000 mL | INTRAMUSCULAR | Status: AC | PRN
  Administered 2019-02-21: 17:00:00 3 mL

## 2019-02-21 MED ORDER — METHYLPREDNISOLONE ACETATE 40 MG/ML IJ SUSP
40.0000 mg | INTRAMUSCULAR | Status: AC | PRN
  Administered 2019-02-21: 40 mg via INTRA_ARTICULAR

## 2019-02-21 MED ORDER — DICLOFENAC SODIUM 1 % TD GEL: 2.0000 g | Freq: Four times a day (QID) | TRANSDERMAL | 3 refills | Status: DC

## 2019-02-21 NOTE — Progress Notes (Signed)
Office Visit Note   Patient: Nathaniel Jones           Date of Birth: 04/04/1981           MRN: [id number] Visit Date: 02/21/2019              Requested by: Clovis RileyMitchell, L.August Saucerean, MD [address] [place], [place] [address] PCP: Clovis RileyMitchell, L.August Saucerean, MD   Assessment & Plan: Visit Diagnoses:  1. Pain in left foot   2. Acute pain of left shoulder     Plan: He is to avoid open back shoes.  Discussed with him shoe wear at length.  He will apply Voltaren gel up to 4 g 4 times daily to the second metatarsal head plantar aspect.  Discussed gastrocsoleus stretching exercises with him.  A pad was placed just proximal to the second metatarsal head region left foot. In regards to his left shoulder he is given Thera-Band to do external and internal exercises as shown.  Will do wall crawls and pendulum exercises also.  See him back in 2 weeks to see what type of response he had to the injection into the exercises.  Questions encouraged and answered at length.  Follow-Up Instructions: Return in about 2 weeks (around 03/07/2019).   Orders:  Orders Placed This Encounter  Procedures   Large Joint Inj: L subacromial bursa   XR Foot Complete Left   XR Shoulder Left   Meds ordered this encounter  Medications   diclofenac sodium (VOLTAREN) 1 % GEL    Sig: Apply 2 g topically 4 (four) times daily.    Dispense:  100 g    Refill:  3      Procedures: Large Joint Inj: L subacromial bursa on 02/21/2019 4:36 PM Indications: pain Details: 22 G 1.5 in needle, superior approach  Arthrogram: No  Medications: 3 mL lidocaine 1 %; 40 mg methylPREDNISolone acetate 40 MG/ML Outcome: tolerated well, no immediate complications Procedure, treatment alternatives, risks and benefits explained, specific risks discussed. Consent was given by the patient. Immediately prior to procedure a time out was called to verify the correct patient, procedure, equipment, support staff and site/side marked as required.  Patient was prepped and draped in the usual sterile fashion.       Clinical Data: No additional findings.   Subjective: Chief Complaint  Patient presents with   Left Foot - Pain   Left Shoulder - Pain    HPI Nathaniel Jones is well-known.by my service comes in today with 2 new complaints.  He has left foot pain and left shoulder pain.  He states he developed his left second toe pain around April 27 no known injury.  Around that same time he had a morning when he woke up due to popping in his left shoulder.  Notes that he was went down evidently raised his arm above his head and heard 2 pops wake up in.  Went to urgent care and he was told to he had a biceps tendon injury.  Has been taking ibuprofen.  He was told to rest this.  He cannot do push-ups due to the pain in the shoulder.  Prior to this he was doing better part of push-ups per day.  Review of Systems Denies any fevers or chills.  Please see HPI otherwise negative  Objective: Vital Signs: There were no vitals taken for this visit.  Physical Exam Constitutional:      Appearance: He is not ill-appearing or diaphoretic.  Pulmonary:  Effort: Pulmonary effort is normal.  Neurological:     Mental Status: He is alert and oriented to person, place, and time.  Psychiatric:        Mood and Affect: Mood normal.        Behavior: Behavior normal.     Ortho Exam Bilateral feet no rashes skin lesions ulcerations.  Tenderness under the left second metatarsal head region only.  Dorsal pedal pulses are 2+ bilaterally. Bilateral shoulders he has 5 out of 5 strength with internal rotation against resistance bilaterally.  External rotation is 4 out of 5 strength on the left and 5 out of 5 on the right against resistance.  Empty can test is negative bilaterally.  Impingement testing is positive on the left negative on the right.  Liftoff test is positive on the left negative on the right.  Obvious biceps rupture on the right which is chronic.   No obvious deformity of the left biceps.  Left distal biceps tendon is palpable and nontender.  He has maximal tenderness the bicipital groove of the left shoulder. Specialty Comments:  No specialty comments available.  Imaging: Xr Foot Complete Left  Result Date: 02/21/2019 Left foot 3 views: No acute fracture.  Morton's type foot with second metatarsal longer than the first.  Sesamoids well located.  No obvious Lisfranc injury.  Mild midfoot arthritic changes noted.  Xr Shoulder Left  Result Date: 02/21/2019 Left shoulder 3 views: No acute fracture.  Glenohumeral joint appears well preserved.  No obvious bony abnormalities.  Shoulder appears well located.    PMFS History: Patient Active Problem List   Diagnosis Date Noted   Unilateral primary osteoarthritis, left knee 05/17/2018   Unilateral primary osteoarthritis, right knee 05/17/2018   Chronic pain of both knees 05/17/2018   Biceps rupture, proximal, right, initial encounter 05/22/2017   Chronic bilateral low back pain without sciatica 03/08/2017   Lateral epicondylitis, right elbow 03/08/2017   It band syndrome, left 03/08/2017   Past Medical History:  Diagnosis Date   Hypertension     Family History  Problem Relation Age of Onset   Hypertension Unknown    Diabetes Unknown    Hypertension Mother    Diabetes Mother    Hypertension Father    Diabetes Brother    Stroke Maternal Grandmother    Diabetes Maternal Grandmother    High Cholesterol Maternal Grandmother    Stroke Maternal Grandfather     Past Surgical History:  Procedure Laterality Date   KNEE SURGERY Right    none     Social History   Occupational History   Occupation: recreation Runner, broadcasting/film/video: UNEMPLOYED  Tobacco Use   Smoking status: Never Smoker   Smokeless tobacco: Never Used  Substance and Sexual Activity   Alcohol use: No   Drug use: No   Sexual activity: Not on file

## 2019-03-02 ENCOUNTER — Ambulatory Visit: Payer: Self-pay

## 2019-03-02 ENCOUNTER — Telehealth: Payer: Self-pay | Admitting: *Deleted

## 2019-03-02 ENCOUNTER — Telehealth: Payer: Self-pay

## 2019-03-02 ENCOUNTER — Other Ambulatory Visit: Payer: 59

## 2019-03-02 DIAGNOSIS — Z20822 Contact with and (suspected) exposure to covid-19: Secondary | ICD-10-CM

## 2019-03-02 NOTE — Telephone Encounter (Signed)
Order placed for Suspect Covidid

## 2019-03-02 NOTE — Telephone Encounter (Signed)
Return  Call from  Patient discuss potential exposure to Covid. - 19 .  Offered free testing  For Covid testing.  Patient is interested.  Pt. Is scheduled for today Sat. 03/02/19 at 1:00pm.

## 2019-03-02 NOTE — Telephone Encounter (Signed)
I called pt.    I left a voicemail for him to call us back in reference to his recent visit at [facility].   To call [phone].

## 2019-03-02 NOTE — Telephone Encounter (Signed)
Pt appt is schedule for 03-02-19 at 2pm

## 2019-03-04 LAB — NOVEL CORONAVIRUS, NAA: SARS-CoV-2, NAA: NOT DETECTED

## 2019-03-05 ENCOUNTER — Telehealth: Payer: Self-pay

## 2019-03-05 NOTE — Telephone Encounter (Signed)
Patient LM stating he was contacted Saturday about potential COVID exposure and needed clarification. Tried calling patient, no answer LM

## 2019-03-07 ENCOUNTER — Other Ambulatory Visit: Payer: Self-pay

## 2019-03-07 ENCOUNTER — Encounter: Payer: Self-pay | Admitting: Orthopaedic Surgery

## 2019-03-07 ENCOUNTER — Ambulatory Visit (INDEPENDENT_AMBULATORY_CARE_PROVIDER_SITE_OTHER): Payer: 59 | Admitting: Orthopaedic Surgery

## 2019-03-07 DIAGNOSIS — M25512 Pain in left shoulder: Secondary | ICD-10-CM

## 2019-03-07 NOTE — Progress Notes (Signed)
HPI: Nathaniel Jones returns today follow-up of his left shoulder.  He is status post injection 2 weeks.  He states the injection helped for about 4 days.  His pain still not as bad as it was prior to the injection he just feels some discomfort mostly in the anterior aspect of the shoulder.  Is having no numbness tingling down the arm.  Review of systems see HPI otherwise negative or noncontributory.  Physical exam: Bilateral shoulders external/internal rotation reveals 5-5 strength bilaterally.  Empty can test negative bilaterally.  Liftoff test negative bilaterally.  Positive impingement on the left.  Impression: Left shoulder impingement  Plan: We will send him to formal physical therapy for range of motion strengthening home exercise program and modalities.  If he is not improving greatly in the next 2 weeks he will call our office and we will obtain an MRI to rule out cuff tear.  Questions were encouraged and answered at length.  Did discuss with him that his strength had definitely improved since last office visit and his range of motion is also improved.

## 2019-03-13 ENCOUNTER — Other Ambulatory Visit: Payer: Self-pay

## 2019-03-13 ENCOUNTER — Ambulatory Visit (INDEPENDENT_AMBULATORY_CARE_PROVIDER_SITE_OTHER): Payer: 59 | Admitting: Neurology

## 2019-03-13 DIAGNOSIS — R6889 Other general symptoms and signs: Secondary | ICD-10-CM

## 2019-03-13 DIAGNOSIS — G4733 Obstructive sleep apnea (adult) (pediatric): Secondary | ICD-10-CM

## 2019-03-13 DIAGNOSIS — Z72820 Sleep deprivation: Secondary | ICD-10-CM

## 2019-03-13 DIAGNOSIS — G478 Other sleep disorders: Secondary | ICD-10-CM

## 2019-03-13 DIAGNOSIS — R419 Unspecified symptoms and signs involving cognitive functions and awareness: Secondary | ICD-10-CM

## 2019-03-13 DIAGNOSIS — R413 Other amnesia: Secondary | ICD-10-CM

## 2019-03-13 DIAGNOSIS — G4720 Circadian rhythm sleep disorder, unspecified type: Secondary | ICD-10-CM

## 2019-03-13 DIAGNOSIS — R400 Somnolence: Secondary | ICD-10-CM

## 2019-03-19 ENCOUNTER — Telehealth: Payer: Self-pay

## 2019-03-19 NOTE — Telephone Encounter (Signed)
-----   Message from Star Age, MD sent at 03/19/2019  1:48 PM EDT ----- Patient referred by Dr. Alroy Dust for EDS, seen by me on 11/29/18, diagnostic PSG on 03/13/19 (MSLT not done d/t OSA found and TST of less than 6 h).    Please call and notify the patient that the recent sleep study showed obstructive sleep apnea. He did not sleep very well, less than 6 hours and less than 60% of the time tested. OSA is overall mild, but worth treating to see if he feels better after treatment (esp with regards to sleepiness and his cognitive complaints). To that end I recommend treatment for this in the form of autoPAP, which means, that we don't have to bring him back for a second sleep study with CPAP, but will let him try an autoPAP machine at home, through a DME company (of his choice, or as per insurance requirement). The DME representative will educate him on how to use the machine, how to put the mask on, etc. I have placed an order in the chart. Please send referral, talk to patient, send report to referring MD. We will need a FU in sleep clinic for 10 weeks post-PAP set up, please arrange that with me or one of our NPs. Thanks,   Star Age, MD, PhD [facility])

## 2019-03-19 NOTE — Telephone Encounter (Signed)
I called pt to discuss his sleep study results. No answer, left a message asking him to call me back. 

## 2019-03-19 NOTE — Telephone Encounter (Signed)
Pt returned my call. I advised pt that Dr. Rexene Alberts reviewed their sleep study results and found that pt has mild osa. Dr. Rexene Alberts recommends that pt start an auto pap at home. I reviewed PAP compliance expectations with the pt. Pt is agreeable to starting an auto-PAP. I advised pt that an order will be sent to a DME, [facility], and Huey Romans will call the pt within about one week after they file with the pt's insurance. Huey Romans will show the pt how to use the machine, fit for masks, and troubleshoot the auto-PAP if needed. A follow up appt was made for insurance purposes with Dr. Rexene Alberts on 05/30/19 at 9:30am. Pt verbalized understanding to arrive 15 minutes early and bring their auto-PAP. A letter with all of this information in it will be mailed to the pt as a reminder. I verified with the pt that the address we have on file is correct. Pt verbalized understanding of results. Pt had no questions at this time but was encouraged to call back if questions arise. I have sent the order to [facility] and have received confirmation that they have received the order.

## 2019-03-19 NOTE — Progress Notes (Signed)
Patient referred by Dr. Alroy Dust for EDS, seen by me on 11/29/18, diagnostic PSG on 03/13/19 (MSLT not done d/t OSA found and TST of less than 6 h).    Please call and notify the patient that the recent sleep study showed obstructive sleep apnea. He did not sleep very well, less than 6 hours and less than 60% of the time tested. OSA is overall mild, but worth treating to see if he feels better after treatment (esp with regards to sleepiness and his cognitive complaints). To that end I recommend treatment for this in the form of autoPAP, which means, that we don't have to bring him back for a second sleep study with CPAP, but will let him try an autoPAP machine at home, through a DME company (of his choice, or as per insurance requirement). The DME representative will educate him on how to use the machine, how to put the mask on, etc. I have placed an order in the chart. Please send referral, talk to patient, send report to referring MD. We will need a FU in sleep clinic for 10 weeks post-PAP set up, please arrange that with me or one of our NPs. Thanks,   Star Age, MD, PhD [facility])

## 2019-03-19 NOTE — Addendum Note (Signed)
Addended by: Star Age on: 03/19/2019 01:48 PM   Modules accepted: Orders

## 2019-03-19 NOTE — Procedures (Signed)
PATIENT'S NAME:  Nathaniel Jones, Nathaniel Jones DOB:      04/16/1981      MR#:    [id number]     DATE OF RECORDING: 03/13/2019 REFERRING M.D.:  Lupe Carneyean Mitchell MD Study Performed:   Baseline Polysomnogram HISTORY: 38 year old man with a history of shoulder pain, low back pain, constipation, prediabetes, ED, and obesity, who reports feeling very sleepy during the day for the past 2 or 3 years. The patient endorsed the Epworth Sleepiness Scale at 21 points. The patient's weight 318 pounds with a height of 76 (inches), resulting in a BMI of 38.7 kg/m2. The patient's neck circumference measured 18.25 inches. The patient was supposed to have a next day MSLT, but this was canceled due to sleep disordered breathing noted and total sleep time of less than 6 hours for the baseline study.  CURRENT MEDICATIONS: Flexeril, Voltaren, Seroquel   PROCEDURE:  This is a multichannel digital polysomnogram utilizing the Somnostar 11.2 system.  Electrodes and sensors were applied and monitored per AASM Specifications.   EEG, EOG, Chin and Limb EMG, were sampled at 200 Hz.  ECG, Snore and Nasal Pressure, Thermal Airflow, Respiratory Effort, CPAP Flow and Pressure, Oximetry was sampled at 50 Hz. Digital video and audio were recorded.      BASELINE STUDY  Lights Out was at 23:01 and Lights On at 05:01.  Total recording time (TRT) was 360.5 minutes, with a total sleep time (TST) of 212.5 minutes.   The patient's sleep latency was 30 minutes, which is delayed. REM latency was 122 minutes, which is slightly delayed. The sleep efficiency was 58.9%, which is markedly reduced.     SLEEP ARCHITECTURE: WASO (Wake after sleep onset) was 76 minutes with moderate sleep fragmentation noted.  There were 51.5 minutes in Stage N1, 95 minutes Stage N2, 39 minutes Stage N3 and 27 minutes in Stage REM.  The percentage of Stage N1 was 24.2%, which is markedly increased, Stage N2 was 44.7%, which is normal, Stage N3 was 18.4%, which is normal and Stage R (REM  sleep) was 12.7%, which is reduced. The arousals were noted as: 56 were spontaneous, 0 were associated with PLMs, 8 were associated with respiratory events.  RESPIRATORY ANALYSIS:  There were a total of 41 respiratory events:  0 obstructive apneas, 0 central apneas and 0 mixed apneas with a total of 0 apneas and an apnea index (AI) of 0 /hour. There were 41 hypopneas with a hypopnea index of 11.6 /hour. The patient also had 0 respiratory event related arousals (RERAs).      The total APNEA/HYPOPNEA INDEX (AHI) was 11.6 /hour and the total RESPIRATORY DISTURBANCE INDEX was 0. 11.6 /hour.  20 events occurred in REM sleep and 42 events in NREM. The REM AHI was 44.4 /hour, versus a non-REM AHI of 6.8. The patient spent 193 minutes of total sleep time in the supine position and 20 minutes in non-supine.. The supine AHI was 12.1 versus a non-supine AHI of 6.2.  OXYGEN SATURATION & C02:  The Wake baseline 02 saturation was 98%, with the lowest being 84%. Time spent below 89% saturation equaled 3 minutes.  PERIODIC LIMB MOVEMENTS: The patient had a total of 0 Periodic Limb Movements.  The Periodic Limb Movement (PLM) index was 0 and the PLM Arousal index was 0/hour.  Audio and video analysis did not show any abnormal or unusual movements, behaviors, phonations or vocalizations. The patient took 1 bathroom break. Mild intermittent snoring was noted. The EKG was in keeping with normal  sinus rhythm (NSR). Post-study, the patient indicated that sleep was worse than usual.   IMPRESSION: 1. Obstructive Sleep Apnea(OSA) 2. Dysfunctions associated with sleep stages or arousal from sleep  RECOMMENDATIONS: 1. This study shows sleep fragmentation, reduced sleep efficiency with difficulty initiating and maintaining sleep, as well as abnormal sleep stage percentages; these are nonspecific findings and per se do not signify an intrinsic sleep disorder or a cause for the patient's sleep-related symptoms. Causes include  (but are not limited to) the first night effect of the sleep study, circadian rhythm disturbances, medication effect or an underlying mood disorder or medical problem.  2. This study demonstrates overall mild obstructive sleep apnea, severe in REM sleep with a total AHI of 11.6/hour, REM AHI of 44.4/hour, supine AHI of 12.1/hour and O2 nadir of 84%. Given the patient's medical history and sleep related complaints, treatment with positive airway pressure is recommended; this can be achieved in the form of autoPAP. Alternatively, a full-night CPAP titration study would allow optimization of therapy if needed. Other treatment options may include avoidance of supine sleep position along with weight loss, upper airway or jaw surgery in selected patients or the use of an oral appliance in certain patients. ENT evaluation and/or consultation with a maxillofacial surgeon or dentist may be feasible in some instances.    3. Please note that untreated obstructive sleep apnea may carry additional perioperative morbidity. Patients with significant obstructive sleep apnea should receive perioperative PAP therapy and the surgeons and particularly the anesthesiologist should be informed of the diagnosis and the severity of the sleep disordered breathing. 4. The patient should be cautioned not to drive, work at heights, or operate dangerous or heavy equipment when tired or sleepy. Review and reiteration of good sleep hygiene measures should be pursued with any patient. 5. The patient will be seen in follow-up by Dr. Rexene Alberts at [facility] for discussion of the test results and further management strategies. The referring provider will be notified of the test results.  I certify that I have reviewed the entire raw data recording prior to the issuance of this report in accordance with the Standards of Accreditation of the American Academy of Sleep Medicine (AASM)   Star Age, MD, PhD Diplomat, American Board of Neurology and Sleep  Medicine (Neurology and Sleep Medicine)

## 2019-05-29 ENCOUNTER — Ambulatory Visit: Payer: Self-pay

## 2019-05-29 ENCOUNTER — Ambulatory Visit (INDEPENDENT_AMBULATORY_CARE_PROVIDER_SITE_OTHER): Payer: 59 | Admitting: Orthopaedic Surgery

## 2019-05-29 ENCOUNTER — Other Ambulatory Visit: Payer: Self-pay

## 2019-05-29 DIAGNOSIS — G8929 Other chronic pain: Secondary | ICD-10-CM

## 2019-05-29 DIAGNOSIS — M25561 Pain in right knee: Secondary | ICD-10-CM

## 2019-05-29 MED ORDER — LIDOCAINE HCL 1 % IJ SOLN
3.0000 mL | INTRAMUSCULAR | Status: AC | PRN
  Administered 2019-05-29: 16:00:00 3 mL

## 2019-05-29 MED ORDER — HYDROCODONE-ACETAMINOPHEN 5-325 MG PO TABS: 1.0000 | ORAL_TABLET | Freq: Four times a day (QID) | ORAL | 0 refills | Status: DC | PRN

## 2019-05-29 MED ORDER — METHYLPREDNISOLONE ACETATE 40 MG/ML IJ SUSP
40.0000 mg | INTRAMUSCULAR | Status: AC | PRN
  Administered 2019-05-29: 16:00:00 40 mg via INTRA_ARTICULAR

## 2019-05-29 NOTE — Telephone Encounter (Signed)
I called pt, he has not started auto pap yet but plans on doing so. Pt's appt tomorrow with Dr. Rexene Alberts has been cancelled. Moved to 08/27/19 at 3:00pm. Pt verbalized understanding of new appt date and time. I gave him [employer]'s phone number to call to discuss starting the auto pap. Pt verbalized understanding.

## 2019-05-29 NOTE — Progress Notes (Signed)
Office Visit Note   Patient: Nathaniel Jones           Date of Birth: 12/26/1980           MRN: [id number] Visit Date: 05/29/2019              Requested by: Clovis RileyMitchell, L.August Saucerean, MD [address] [place],  [place] [address] PCP: Clovis RileyMitchell, L.August Saucerean, MD   Assessment & Plan: Visit Diagnoses:  1. Acute pain of right knee     Plan: I was able to aspirate 30 cc of bloody fluid from his left knee.  I did place a steroid injection in the knee.  Given his continued locking catching as well as posterior knee pain combined with the bloody effusion, an MRI is warranted to rule out a meniscal tear or another internal derangement that may be causing this type of pain.  I am concerned that he could have a plateau fracture that is not seen on plain films given amount of blood I am seeing in his knee and the pain that he is describing.  For now he will offload his knee and take anti-inflammatories.  I will prescribe a short course of hydrocodone.  We are planning to obtain an MRI of his knee.  He will call the office for follow-up appointment once we have the MRI of his knee.  Follow-Up Instructions: Follow-up will be after an MRI.  Orders:  No orders of the defined types were placed in this encounter.  Meds ordered this encounter  Medications  . HYDROcodone-acetaminophen (NORCO/VICODIN) 5-325 MG tablet    Sig: Take 1-2 tablets by mouth every 6 (six) hours as needed for moderate pain.    Dispense:  30 tablet    Refill:  0      Procedures: Large Joint Inj: R knee on 05/29/2019 39:55 PM Indications: diagnostic evaluation and pain Details: 22 G 1.5 in needle, superolateral approach  Arthrogram: No  Medications: 3 mL lidocaine 1 %; 40 mg methylPREDNISolone acetate 40 MG/ML Outcome: tolerated well, no immediate complications Procedure, treatment alternatives, risks and benefits explained, specific risks discussed. Consent was given by the patient. Immediately prior to procedure a time out was  called to verify the correct patient, procedure, equipment, support staff and site/side marked as required. Patient was prepped and draped in the usual sterile fashion.       Clinical Data: No additional findings.   Subjective: Chief Complaint  Patient presents with  . Right Knee - Pain  The patient is a 38 year old gentleman well-known to me.  We actually performed arthroscopic intervention on his right knee over 4 years ago.  He has been doing well until recently about 6 days ago he stepped down out of a truck landing hard directly on that knee.  He is someone who does weigh 318 pounds but he is also very tall.  He has had severe pain in the knee since then.  Is in the back of his knee where he points to between the hamstring down to his calf.  He says his knee feels full.  He did go to an urgent care recently and x-rays were obtained and he was told he just had a bone spur.  A steroid is placed in his knee but he said that has not helped at all.  He does report some locking and catching as well. HPI  Review of Systems He currently denies any headache, chest pain, shortness of breath, fever, chills, nausea, vomiting  Objective: Vital Signs: There were no vitals taken for this visit.  Physical Exam He is alert and orient x3 and in no acute distress Ortho Exam Examination of his right knee does show fullness in the popliteal area.  There is a moderate joint effusion.  Once I start trying to flex him past 90 degrees he gets severe pain in the posterior aspect of his knee on the right side.  There is some pain of the hamstrings and the gastroc.  His calf is soft. Specialty Comments:  No specialty comments available.  Imaging: No results found.   PMFS History: Patient Active Problem List   Diagnosis Date Noted  . Unilateral primary osteoarthritis, left knee 05/17/2018  . Unilateral primary osteoarthritis, right knee 05/17/2018  . Chronic pain of both knees 05/17/2018  . Biceps  rupture, proximal, right, initial encounter 05/22/2017  . Chronic bilateral low back pain without sciatica 03/08/2017  . Lateral epicondylitis, right elbow 03/08/2017  . It band syndrome, left 03/08/2017   Past Medical History:  Diagnosis Date  . Hypertension     Family History  Problem Relation Age of Onset  . Hypertension Unknown   . Diabetes Unknown   . Hypertension Mother   . Diabetes Mother   . Hypertension Father   . Diabetes Brother   . Stroke Maternal Grandmother   . Diabetes Maternal Grandmother   . High Cholesterol Maternal Grandmother   . Stroke Maternal Grandfather     Past Surgical History:  Procedure Laterality Date  . KNEE SURGERY Right   . none     Social History   Occupational History  . Occupation: recreation Production designer, theatre/television/film: UNEMPLOYED  Tobacco Use  . Smoking status: Never Smoker  . Smokeless tobacco: Never Used  Substance and Sexual Activity  . Alcohol use: No  . Drug use: No  . Sexual activity: Not on file

## 2019-05-30 ENCOUNTER — Ambulatory Visit: Payer: 59 | Admitting: Neurology

## 2019-07-04 ENCOUNTER — Ambulatory Visit
Admission: RE | Admit: 2019-07-04 | Discharge: 2019-07-04 | Disposition: A | Discharge status: Home / Self Care | Payer: 59 | Source: Ambulatory Visit | Attending: Orthopaedic Surgery | Admitting: Orthopaedic Surgery

## 2019-07-04 DIAGNOSIS — G8929 Other chronic pain: Secondary | ICD-10-CM

## 2019-07-08 ENCOUNTER — Ambulatory Visit (INDEPENDENT_AMBULATORY_CARE_PROVIDER_SITE_OTHER): Payer: 59 | Admitting: Orthopaedic Surgery

## 2019-07-08 ENCOUNTER — Encounter: Payer: Self-pay | Admitting: Orthopaedic Surgery

## 2019-07-08 ENCOUNTER — Telehealth: Payer: Self-pay

## 2019-07-08 DIAGNOSIS — M1711 Unilateral primary osteoarthritis, right knee: Secondary | ICD-10-CM

## 2019-07-08 NOTE — Telephone Encounter (Signed)
Right knee gel injection  

## 2019-07-08 NOTE — Progress Notes (Signed)
The patient comes in today to go over an MRI of his right knee.  We have seen him for some time for this knee.  He is only 38 years old.  He is a very tall individual but does weigh 318 pounds.  He does perform kickboxing.  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  We have actually performed arthroscopic surgery on this knee remotely.  He has been having worsening acute pain in the knee especially the medial joint line so we sent him for an MRI to really assess what his cartilage looks like.  He mainly points again to the medial joint line is source of his pain.  On examination of his right knee there is no effusion today but there is significant medial joint line tenderness especially flexion extension and palpation.  His right knee feels ligamentously stable.  MRI of the right knee is reviewed with him.  There is definitely full-thickness cartilage changes in the medial compartment of the knee on both the medial femoral condyle and the medial tibial plateau.  There is no meniscal tear.  The lateral compartment shows mild to moderate arthritic changes but no full-thickness cartilage loss.  There is significant cartilage loss the patellofemoral joint.  Most of the disease knows the medial compartment.  We talked about things such as quad strengthening exercises and taking a supplement such as Tumeric.  He will avoid high impact aerobic activities and will try a hyaluronic acid injection in his knee 4 weeks from now we order this and get it approved through insurance.  I do feel is medically necessary given the amount of arthritis in his knee that we tried this for him.  He is failed injections with steroid.

## 2019-07-09 NOTE — Telephone Encounter (Signed)
Noted  

## 2019-07-11 ENCOUNTER — Telehealth: Payer: Self-pay

## 2019-07-11 NOTE — Telephone Encounter (Signed)
Submitted VOB for Durolane, right knee. 

## 2019-07-19 ENCOUNTER — Telehealth: Payer: Self-pay

## 2019-07-19 NOTE — Telephone Encounter (Signed)
Approved for Durolane, right knee. [place] must be met before coverage applies. Covered at 100% of the allowable amount  Co-pay of $50.00 No PA required  Appt. 08/05/2019 with Dr. Ninfa Linden

## 2019-08-05 ENCOUNTER — Ambulatory Visit: Payer: 59 | Admitting: Orthopaedic Surgery

## 2019-08-05 ENCOUNTER — Encounter: Payer: Self-pay | Admitting: Orthopaedic Surgery

## 2019-08-05 ENCOUNTER — Other Ambulatory Visit: Payer: Self-pay

## 2019-08-05 DIAGNOSIS — M1711 Unilateral primary osteoarthritis, right knee: Secondary | ICD-10-CM

## 2019-08-05 MED ORDER — SODIUM HYALURONATE 60 MG/3ML IX PRSY
60.0000 mg | PREFILLED_SYRINGE | INTRA_ARTICULAR | Status: AC | PRN
  Administered 2019-08-05: 60 mg via INTRA_ARTICULAR

## 2019-08-05 MED ORDER — METHYLPREDNISOLONE ACETATE 40 MG/ML IJ SUSP
40.0000 mg | INTRAMUSCULAR | Status: AC | PRN
  Administered 2019-08-05: 40 mg via INTRA_ARTICULAR

## 2019-08-05 NOTE — Progress Notes (Signed)
   Procedure Note  Patient: Nathaniel Jones             Date of Birth: Dec 01, 1980           MRN: [id number]             Visit Date: 08/05/2019  Procedures: Visit Diagnoses:  1. Unilateral primary osteoarthritis, right knee     Large Joint Inj: R knee on 08/05/2019 1:18 PM Indications: diagnostic evaluation and pain Details: 22 G 1.5 in needle, superolateral approach  Arthrogram: No  Medications: 40 mg methylPREDNISolone acetate 40 MG/ML; 60 mg Sodium Hyaluronate 60 MG/3ML Outcome: tolerated well, no immediate complications Procedure, treatment alternatives, risks and benefits explained, specific risks discussed. Consent was given by the patient. Immediately prior to procedure a time out was called to verify the correct patient, procedure, equipment, support staff and site/side marked as required. Patient was prepped and draped in the usual sterile fashion.    Nathaniel Jones is here for scheduled hyaluronic acid injection into his right knee with Durolane to treat the pain from osteoarthritis.  This is been well-documented and he is tried failed other conservative treatment measures.  He understands fully why we are trying this injection today.  His knee shows no swelling but global tenderness today.  The knee is ligamentously stable.  He tolerated the Durolane injection well.  All question concerns were answered addressed.  We will see him back in about 3 months to see how he is doing overall but no x-rays are needed.  My neck step would be considering an arthroscopic intervention with microfracture surgery given his young age of only being 38 years old.

## 2019-08-07 ENCOUNTER — Telehealth: Payer: Self-pay | Admitting: Orthopaedic Surgery

## 2019-08-07 MED ORDER — HYDROCODONE-ACETAMINOPHEN 5-325 MG PO TABS: 1.0000 | ORAL_TABLET | Freq: Four times a day (QID) | ORAL | 0 refills | Status: DC | PRN

## 2019-08-07 NOTE — Telephone Encounter (Signed)
I sent in some Norco

## 2019-08-07 NOTE — Telephone Encounter (Signed)
Please advise 

## 2019-08-07 NOTE — Telephone Encounter (Signed)
Pt called in said he came in for an appt 11/2 and got a Durolane injection and pt states it made his pain worse and now cant bend his knee, pt is wondering if you can send in some pain medication for the pain in his knee? Please have that sent to [facility] on [address]  [phone]

## 2019-08-12 ENCOUNTER — Other Ambulatory Visit: Payer: Self-pay

## 2019-08-12 ENCOUNTER — Ambulatory Visit: Payer: Self-pay

## 2019-08-12 ENCOUNTER — Encounter: Payer: Self-pay | Admitting: Family Medicine

## 2019-08-12 ENCOUNTER — Ambulatory Visit (INDEPENDENT_AMBULATORY_CARE_PROVIDER_SITE_OTHER): Payer: 59 | Admitting: Family Medicine

## 2019-08-12 DIAGNOSIS — M1711 Unilateral primary osteoarthritis, right knee: Secondary | ICD-10-CM

## 2019-08-12 MED ORDER — METHYLPREDNISOLONE 4 MG PO TBPK: ORAL_TABLET | ORAL | 0 refills | Status: DC

## 2019-08-12 NOTE — Progress Notes (Signed)
Office Visit Note   Patient: Nathaniel Jones           Date of Birth: [birth year]           MRN: [id number] Visit Date: 08/12/2019 Requested by: [name], [name], MD [address] [place],  [place] [address] PCP: [name], [name], MD  Subjective: Chief Complaint  Patient presents with  . Right Knee - Pain    S/p Durolane injection last Monday. The next morning the knee was swollen with burning pain in it. Ice is the only thing that has given relief. Worse with lying down and any compression. Pain starts in posterior knee & into the anterior knee.    HPI: He is here with right knee pain.  He had a Durolane injection last Monday.  He went to work that night seeming to be okay, but the next morning he was in quite a bit of pain.  The injection was given laterally but his pain has been mostly medial and posterior.  Pain was 10/10 initially, but now has improved to 3/10.  He is using Voltaren gel topically.  Denies fevers or chills.  He does note that his knee popped a couple times and seemed to feel better afterward.  The last time he had a hyaluronic acid injection, he was sore in a similar manner but not as severe, it lasted about 2 weeks.  He seems to do better with cortisone.              ROS:   All other systems were reviewed and are negative.  Objective: Vital Signs: There were no vitals taken for this visit.  Physical Exam:  General:  Alert and oriented, in no acute distress. Pulm:  Breathing unlabored. Psy:  Normal mood, congruent affect. Skin: No erythema or rash. Right knee: Trace effusion with no warmth.  Full active extension, flexion of 130 degrees.  Tender on the medial joint line with no palpable click on McMurray's.  Slightly tender posteriorly with no palpable popliteal cyst.   Imaging: None today.  Assessment & Plan: 1.  Worsening right knee pain status post Durolane injection, etiology uncertain.  Could be a reaction to hyaluronic acid or possibly some of  it was extra-articular. -Medrol Dosepak, out of work for 2 days.  If symptoms persist, could inject with cortisone.     Procedures: No procedures performed  No notes on file     PMFS History: Patient Active Problem List   Diagnosis Date Noted  . Unilateral primary osteoarthritis, left knee 05/17/2018  . Unilateral primary osteoarthritis, right knee 05/17/2018  . Chronic pain of both knees 05/17/2018  . Biceps rupture, proximal, right, initial encounter 05/22/2017  . Chronic bilateral low back pain without sciatica 03/08/2017  . Lateral epicondylitis, right elbow 03/08/2017  . It band syndrome, left 03/08/2017   Past Medical History:  Diagnosis Date  . Hypertension     Family History  Problem Relation Age of Onset  . Hypertension Unknown   . Diabetes Unknown   . Hypertension Mother   . Diabetes Mother   . Hypertension Father   . Diabetes Brother   . Stroke Maternal Grandmother   . Diabetes Maternal Grandmother   . High Cholesterol Maternal Grandmother   . Stroke Maternal Grandfather     Past Surgical History:  Procedure Laterality Date  . KNEE SURGERY Right   . none     Social History   Occupational History  . Occupation: recreation Development worker, international aid  Employer: UNEMPLOYED  Tobacco Use  . Smoking status: Never Smoker  . Smokeless tobacco: Never Used  Substance and Sexual Activity  . Alcohol use: No  . Drug use: No  . Sexual activity: Not on file

## 2019-08-27 ENCOUNTER — Ambulatory Visit: Payer: 59 | Admitting: Neurology

## 2019-09-05 ENCOUNTER — Telehealth: Payer: Self-pay | Admitting: *Deleted

## 2019-09-05 NOTE — Telephone Encounter (Signed)
I called pt and he has not picked up machine from [employer].  He states he plans on doing this, but his work schedule is crazy and he has not been able to do this.  I told him to call us to schedule when he gets his machine.  He verbalized understanding.

## 2019-09-05 NOTE — Telephone Encounter (Signed)
Noted, thank you

## 2019-09-09 ENCOUNTER — Ambulatory Visit: Payer: 59 | Admitting: Neurology

## 2019-09-18 ENCOUNTER — Encounter: Payer: Self-pay | Admitting: Family Medicine

## 2019-09-18 ENCOUNTER — Ambulatory Visit: Payer: 59 | Admitting: Family Medicine

## 2019-09-18 ENCOUNTER — Other Ambulatory Visit: Payer: Self-pay

## 2019-09-18 VITALS — Ht 76.0 in | Wt 302.0 lb

## 2019-09-18 DIAGNOSIS — M79645 Pain in left finger(s): Secondary | ICD-10-CM

## 2019-09-18 DIAGNOSIS — M25522 Pain in left elbow: Secondary | ICD-10-CM | POA: Diagnosis not present

## 2019-09-18 MED ORDER — NABUMETONE 750 MG PO TABS: 750.0000 mg | ORAL_TABLET | Freq: Two times a day (BID) | ORAL | 6 refills | Status: DC | PRN

## 2019-09-18 MED ORDER — NITROGLYCERIN 0.1 MG/HR TD PT24: MEDICATED_PATCH | TRANSDERMAL | 3 refills | Status: DC

## 2019-09-18 NOTE — Progress Notes (Signed)
Office Visit Note   Patient: Nathaniel Jones           Date of Birth: 1980/10/14           MRN: [id number] Visit Date: 09/18/2019 Requested by: Alroy Dust, L.Marlou Sa, [place] [address] [place],  [place] [address] PCP: Alroy Dust, L.Marlou Sa, MD  Subjective: Chief Complaint  Patient presents with  . Left Ring Finger - Injury    Seen at [facility]--said it was dislocated---a basketball bounced into finger on 09/16/2019---? If he needs a splint for it  . Left Elbow - Pain    Was seen at [facility] x 2 wks ago given dose pak, and it has not helped him.    HPI: He is here with left elbow and left fourth finger pain.  Elbow hurting about 2 weeks ago on the medial aspect.  He went to urgent care where x-rays were obtained and he was diagnosed with epicondylitis and treated with a Medrol Dosepak.  Unfortunately it has not helped.  He bought an over-the-counter tennis elbow strap but that does not seem to be helping either.  2 days ago playing basketball, a ball bounced off the ground and into his left fourth finger.  He dislocated the PIP joint.  He reduced it himself and went to [facility] urgent care where x-rays were supposedly negative for fracture.  He has been buddy taping but feels like he needs some sort of splint.  He is right-hand dominant.               ROS: No fevers or chills.  All other systems were reviewed and are negative.  Objective: Vital Signs: Ht 6\' 4"  (1.93 m)   Wt (!) 302 lb (137 kg)   BMI 36.76 kg/m   Physical Exam:  General:  Alert and oriented, in no acute distress. Pulm:  Breathing unlabored. Psy:  Normal mood, congruent affect. Skin: There is significant bruising on the palm side of his left fourth finger over the proximal phalanx. Left hand: His fourth finger is swollen and bruised at the PIP joint with tenderness primarily on the volar aspect.  The collateral ligaments feel stable.  Extensor tendon function is intact.  FDP function is intact.  He is unable to  actively flex at the PIP joint possibly due to pain. Left elbow: No effusion.  Full range of motion.  Point tender at the common flexor tendon at the medial epicondyle.  No subluxation of the ulnar nerve.  Imaging: None today.  Assessment & Plan: 1.  2 days status post closed reduction left fourth finger PIP dislocation -Dorsal splint, buddy tape.  Follow-up in about a week and a half for recheck.  If not improving, repeat x-rays and possibly image his tendons with ultrasound.  2.  Left elbow medial epicondylitis -Nitroglycerin patch, Relafen as needed.  Home exercises given. -Occupational therapy referral or cortisone injection if symptoms persist.     Procedures: No procedures performed  No notes on file     PMFS History: Patient Active Problem List   Diagnosis Date Noted  . Unilateral primary osteoarthritis, left knee 05/17/2018  . Unilateral primary osteoarthritis, right knee 05/17/2018  . Chronic pain of both knees 05/17/2018  . Biceps rupture, proximal, right, initial encounter 05/22/2017  . Chronic bilateral low back pain without sciatica 03/08/2017  . Lateral epicondylitis, right elbow 03/08/2017  . It band syndrome, left 03/08/2017   Past Medical History:  Diagnosis Date  . Hypertension  Family History  Problem Relation Age of Onset  . Hypertension Unknown   . Diabetes Unknown   . Hypertension Mother   . Diabetes Mother   . Hypertension Father   . Diabetes Brother   . Stroke Maternal Grandmother   . Diabetes Maternal Grandmother   . High Cholesterol Maternal Grandmother   . Stroke Maternal Grandfather     Past Surgical History:  Procedure Laterality Date  . KNEE SURGERY Right   . none     Social History   Occupational History  . Occupation: recreation Runner, broadcasting/film/video: UNEMPLOYED  Tobacco Use  . Smoking status: Never Smoker  . Smokeless tobacco: Never Used  Substance and Sexual Activity  . Alcohol use: No  . Drug use: No  . Sexual  activity: Not on file

## 2019-10-01 ENCOUNTER — Ambulatory Visit: Payer: 59 | Admitting: Family Medicine

## 2019-10-01 ENCOUNTER — Encounter: Payer: Self-pay | Admitting: Family Medicine

## 2019-10-01 ENCOUNTER — Other Ambulatory Visit: Payer: Self-pay

## 2019-10-01 DIAGNOSIS — M25522 Pain in left elbow: Secondary | ICD-10-CM

## 2019-10-01 DIAGNOSIS — M79645 Pain in left finger(s): Secondary | ICD-10-CM | POA: Diagnosis not present

## 2019-10-01 NOTE — Progress Notes (Signed)
Office Visit Note   Patient: Nathaniel Jones           Date of Birth: October 02, 1981           MRN: [id number] Visit Date: 10/01/2019 Requested by: Alroy Dust, L.Marlou Sa, [place] [address] [place],  [place] [address] PCP: Alroy Dust, L.Marlou Sa, MD  Subjective: Chief Complaint  Patient presents with  . Left Elbow - Pain, Follow-up    Still hurts with lifting and turning the elbow certain ways.  . Left Ring Finger - Pain, Follow-up, Numbness    Finger is some better. Still has some swelling at base of finger, with numbness around the PIP joint.    HPI: He is here for follow-up left elbow and third finger pain.  Since last visit, finger is feeling a little bit better.  He still has some swelling and stiffness and some pain when he tries to flex the PIP joint but overall he is improved.  Is been about 3 weeks since his dislocation.  His medial left elbow continues to hurt when lifting things.  Nitroglycerin patches did not help.  He is interested in trying a cortisone injection.  Denies numbness or tingling.              ROS:   All other systems were reviewed and are negative.  Objective: Vital Signs: There were no vitals taken for this visit.  Physical Exam:  General:  Alert and oriented, in no acute distress. Pulm:  Breathing unlabored. Psy:  Normal mood, congruent affect.  Left elbow: He is point tender at the common flexor tendon at the medial epicondyle.  Negative Tinel's at the ulnar groove.  Pain with wrist flexion and finger flexion against resistance. Left third finger: Still has swelling at the PIP joint.  No laxity of the collateral ligaments.  Tender on the volar aspect of the PIP.  Also tender medially and laterally.  Imaging: None today  Assessment & Plan: 1.  Left elbow medial epicondylitis -Elected to inject with cortisone today.  Occupational therapy if symptoms persist.  2.  3-week status post left third finger PIP dislocation, clinically healing -Reassurance,  continue with range of motion over the next few weeks.  Follow-up as needed.     Procedures: Left elbow injection: After sterile prep with Betadine, injected 2 cc 1% lidocaine without epinephrine and 40 mg methylprednisolone into the area of maximum tenderness at the common flexor tendon.   PMFS History: Patient Active Problem List   Diagnosis Date Noted  . Unilateral primary osteoarthritis, left knee 05/17/2018  . Unilateral primary osteoarthritis, right knee 05/17/2018  . Chronic pain of both knees 05/17/2018  . Biceps rupture, proximal, right, initial encounter 05/22/2017  . Chronic bilateral low back pain without sciatica 03/08/2017  . Lateral epicondylitis, right elbow 03/08/2017  . It band syndrome, left 03/08/2017   Past Medical History:  Diagnosis Date  . Hypertension     Family History  Problem Relation Age of Onset  . Hypertension Unknown   . Diabetes Unknown   . Hypertension Mother   . Diabetes Mother   . Hypertension Father   . Diabetes Brother   . Stroke Maternal Grandmother   . Diabetes Maternal Grandmother   . High Cholesterol Maternal Grandmother   . Stroke Maternal Grandfather     Past Surgical History:  Procedure Laterality Date  . KNEE SURGERY Right   . none     Social History   Occupational History  . Occupation:  recreation Runner, broadcasting/film/video: UNEMPLOYED  Tobacco Use  . Smoking status: Never Smoker  . Smokeless tobacco: Never Used  Substance and Sexual Activity  . Alcohol use: No  . Drug use: No  . Sexual activity: Not on file

## 2019-10-08 ENCOUNTER — Encounter: Payer: Self-pay | Admitting: Family Medicine

## 2019-10-08 ENCOUNTER — Telehealth: Payer: Self-pay | Admitting: Family Medicine

## 2019-10-08 NOTE — Telephone Encounter (Signed)
Sent him a MyChart message asking him to describe what restrictions he needs me to give him.

## 2019-10-08 NOTE — Telephone Encounter (Signed)
Patient called. He needs a note detailing his restrictions.   Call back note: [phone]

## 2019-11-05 ENCOUNTER — Other Ambulatory Visit: Payer: Self-pay

## 2019-11-05 ENCOUNTER — Ambulatory Visit: Payer: 59 | Admitting: Orthopaedic Surgery

## 2019-11-05 ENCOUNTER — Ambulatory Visit: Payer: Self-pay

## 2019-11-05 ENCOUNTER — Encounter: Payer: Self-pay | Admitting: Orthopaedic Surgery

## 2019-11-05 DIAGNOSIS — M25562 Pain in left knee: Secondary | ICD-10-CM | POA: Diagnosis not present

## 2019-11-05 DIAGNOSIS — G8929 Other chronic pain: Secondary | ICD-10-CM | POA: Diagnosis not present

## 2019-11-05 DIAGNOSIS — S63255A Unspecified dislocation of left ring finger, initial encounter: Secondary | ICD-10-CM | POA: Diagnosis not present

## 2019-11-05 DIAGNOSIS — S63259D Unspecified dislocation of unspecified finger, subsequent encounter: Secondary | ICD-10-CM

## 2019-11-05 MED ORDER — METHYLPREDNISOLONE ACETATE 40 MG/ML IJ SUSP
40.0000 mg | INTRAMUSCULAR | Status: AC | PRN
  Administered 2019-11-05: 10:00:00 40 mg via INTRA_ARTICULAR

## 2019-11-05 MED ORDER — LIDOCAINE HCL 1 % IJ SOLN
3.0000 mL | INTRAMUSCULAR | Status: AC | PRN
  Administered 2019-11-05: 3 mL

## 2019-11-05 NOTE — Progress Notes (Signed)
Office Visit Note   Patient: Nathaniel Jones           Date of Birth: Apr 20, 1981           MRN: [id number] Visit Date: 11/05/2019              Requested by: Alroy Dust, L.Marlou Sa, [place] [address] [place],  [place] [address] PCP: Alroy Dust, L.Marlou Sa, MD   Assessment & Plan: Visit Diagnoses:  1. Finger dislocation, subsequent encounter   2. Chronic pain of left knee   3. Left knee pain, unspecified chronicity     Plan: We will send him to hand therapy for range of motion and modalities to the left index finger.  Recommend he use Voltaren gel up to 4 times daily to the left ring finger.  We will see what type of response he has to the left knee injection.  He may benefit from Durolane injection in the future in the left knee.  Questions encouraged and answered.  Follow-up 4 weeks  Follow-Up Instructions: Return in about 4 weeks (around 12/03/2019).   Orders:  Orders Placed This Encounter  Procedures  . Large Joint Inj  . XR Knee 1-2 Views Left  . XR Finger Ring Left   No orders of the defined types were placed in this encounter.     Procedures: Large Joint Inj: L knee on 11/05/2019 10:23 AM Indications: pain Details: 22 G 1.5 in needle, anterolateral approach  Arthrogram: No  Medications: 3 mL lidocaine 1 %; 40 mg methylPREDNISolone acetate 40 MG/ML Outcome: tolerated well, no immediate complications Procedure, treatment alternatives, risks and benefits explained, specific risks discussed. Consent was given by the patient. Immediately prior to procedure a time out was called to verify the correct patient, procedure, equipment, support staff and site/side marked as required. Patient was prepped and draped in the usual sterile fashion.       Clinical Data: No additional findings.   Subjective: Chief Complaint  Patient presents with  . Right Knee - Follow-up    HPI  Mr. Hathorne returns today due to his left ring finger.  Continues to have pain in the ring finger  which she had a dislocation at the PIP joint on 09/16/2019.  He was seen at [facility] urgent care and then followed up with Dr. Junius Roads here.  He states that finger feels stiff and he feels like he has decreased strength and range of motion.  In regards to his right knee he states the Durolane injection definitely helped.  He has known osteoarthritis left knee and the knee is starting to bother him.  Has been sometime since we last x-rayed the knee.  No new injury to the knee.  Review of Systems See HPI.  Objective: Vital Signs: There were no vitals taken for this visit.  Physical Exam General: Well-developed well-nourished male no acute distress mood affect appropriate.  Psych alert and oriented x3. Ortho Exam Left hand full extension of the ring finger.  He does have slight swelling at the PIP joint of the ring finger.  He is unable to bring the ring finger down to his palm.  No gross deformity of the left index finger. Bilateral knees good range of motion.  No instability with valgus varus stressing.  No effusion abnormal warmth erythema of either knee.  Tenderness along the medial joint line of the left knee.  McMurray's is negative left knee. Specialty Comments:  No specialty comments available.  Imaging: XR Knee 1-2  Views Left  Result Date: 11/05/2019 Left knee 2 views: No acute fracture.  Moderate medial joint line narrowing.  Moderate patellofemoral narrowing.  Osteophytes off the lateral aspect lateral compartment.  XR Finger Ring Left  Result Date: 11/05/2019 Left ring finger 3 views: No acute fractures no bony abnormalities.  Finger is well located.  No arthritic changes.    PMFS History: Patient Active Problem List   Diagnosis Date Noted  . Unilateral primary osteoarthritis, left knee 05/17/2018  . Unilateral primary osteoarthritis, right knee 05/17/2018  . Chronic pain of both knees 05/17/2018  . Biceps rupture, proximal, right, initial encounter 05/22/2017  . Chronic  bilateral low back pain without sciatica 03/08/2017  . Lateral epicondylitis, right elbow 03/08/2017  . It band syndrome, left 03/08/2017   Past Medical History:  Diagnosis Date  . Hypertension     Family History  Problem Relation Age of Onset  . Hypertension Unknown   . Diabetes Unknown   . Hypertension Mother   . Diabetes Mother   . Hypertension Father   . Diabetes Brother   . Stroke Maternal Grandmother   . Diabetes Maternal Grandmother   . High Cholesterol Maternal Grandmother   . Stroke Maternal Grandfather     Past Surgical History:  Procedure Laterality Date  . KNEE SURGERY Right   . none     Social History   Occupational History  . Occupation: recreation Runner, broadcasting/film/video: UNEMPLOYED  Tobacco Use  . Smoking status: Never Smoker  . Smokeless tobacco: Never Used  Substance and Sexual Activity  . Alcohol use: No  . Drug use: No  . Sexual activity: Not on file

## 2019-12-03 ENCOUNTER — Ambulatory Visit: Payer: 59 | Admitting: Orthopaedic Surgery

## 2019-12-24 ENCOUNTER — Ambulatory Visit: Payer: 59 | Admitting: Orthopaedic Surgery

## 2019-12-24 DIAGNOSIS — Z20822 Contact with and (suspected) exposure to covid-19: Secondary | ICD-10-CM | POA: Insufficient documentation

## 2020-01-08 ENCOUNTER — Other Ambulatory Visit: Payer: Self-pay

## 2020-01-08 ENCOUNTER — Ambulatory Visit: Payer: 59 | Admitting: Orthopaedic Surgery

## 2020-01-08 ENCOUNTER — Encounter: Payer: Self-pay | Admitting: Orthopaedic Surgery

## 2020-01-08 DIAGNOSIS — M25562 Pain in left knee: Secondary | ICD-10-CM

## 2020-01-08 DIAGNOSIS — G8929 Other chronic pain: Secondary | ICD-10-CM | POA: Diagnosis not present

## 2020-01-08 DIAGNOSIS — S63259D Unspecified dislocation of unspecified finger, subsequent encounter: Secondary | ICD-10-CM | POA: Diagnosis not present

## 2020-01-08 NOTE — Progress Notes (Signed)
Nathaniel Jones is well-known to me.  He has been recovering from a dislocation of his right ring finger at the PIP joint.  He also has chronic knee issues and recently throughout his back.  He is someone who is very tall and a stout individual.  He is also an Radio producer as well.  The last time he had hyaluronic acid injections in his knees he had a bad reaction to the right knee and had to have fluid drained off the knee.  He is reluctant to try anything else for his knees.  He does have known osteoarthritis in both his knees.  On examination today his left ring finger today shows that it is ligamentously stable.  He has full range of motion of it.  There is residual swelling to be expected that will decrease with time.  I gave him reassurance that the finger should do well.  From a knee and lumbar spine standpoint there is nothing else I would recommend other than core strengthening exercises, quad strengthening exercises and weight loss.  If his back becomes an issue for him he will let us know because I would obtain an MRI if he still continues to have symptoms of the spine giving out on him.  All question concerns were answered and addressed.  Follow-up as otherwise as needed.

## 2020-01-14 ENCOUNTER — Telehealth: Payer: Self-pay | Admitting: Orthopaedic Surgery

## 2020-01-14 NOTE — Telephone Encounter (Signed)
Patient called. Says he is still in a lot of pain. Would like someone to call him and let him know what other options he has. His call back number is [phone]

## 2020-01-14 NOTE — Telephone Encounter (Signed)
Please advise 

## 2020-01-15 ENCOUNTER — Other Ambulatory Visit: Payer: Self-pay

## 2020-01-15 DIAGNOSIS — M5442 Lumbago with sciatica, left side: Secondary | ICD-10-CM

## 2020-01-15 DIAGNOSIS — M4807 Spinal stenosis, lumbosacral region: Secondary | ICD-10-CM

## 2020-01-15 MED ORDER — HYDROCODONE-ACETAMINOPHEN 5-325 MG PO TABS: 1.0000 | ORAL_TABLET | Freq: Four times a day (QID) | ORAL | 0 refills | Status: DC | PRN

## 2020-01-15 NOTE — Telephone Encounter (Signed)
I did speak to him and let him know that I would send in some pain medication for him.  He is having issues with his back that is having pain now radiating into both thighs.  I would like him to be set up for outpatient physical therapy for his back for traction and any other modalities that can help with his back pain and radicular symptoms.  Also, I do feel it is important for essential to obtain an MRI of the lumbar spine to rule out herniated disc.

## 2020-01-15 NOTE — Telephone Encounter (Signed)
Ordered

## 2020-01-21 ENCOUNTER — Telehealth: Payer: Self-pay | Admitting: Orthopaedic Surgery

## 2020-01-21 ENCOUNTER — Other Ambulatory Visit: Payer: Self-pay | Admitting: Physician Assistant

## 2020-01-21 DIAGNOSIS — R109 Unspecified abdominal pain: Secondary | ICD-10-CM

## 2020-01-21 NOTE — Telephone Encounter (Signed)
LM on voicemail for patient to call the office to schedule their MRI review with Dr. Magnus Ivan after April 26.  Thank you.

## 2020-01-23 ENCOUNTER — Encounter: Payer: Self-pay | Admitting: Physical Therapy

## 2020-01-23 ENCOUNTER — Other Ambulatory Visit: Payer: Self-pay

## 2020-01-23 ENCOUNTER — Ambulatory Visit: Payer: 59 | Admitting: Physical Therapy

## 2020-01-23 DIAGNOSIS — M6281 Muscle weakness (generalized): Secondary | ICD-10-CM

## 2020-01-23 DIAGNOSIS — M5442 Lumbago with sciatica, left side: Secondary | ICD-10-CM | POA: Diagnosis not present

## 2020-01-23 DIAGNOSIS — G8929 Other chronic pain: Secondary | ICD-10-CM | POA: Diagnosis not present

## 2020-01-23 DIAGNOSIS — R262 Difficulty in walking, not elsewhere classified: Secondary | ICD-10-CM | POA: Diagnosis not present

## 2020-01-23 NOTE — Therapy (Signed)
[facility] Physical Therapy [address] [place], [place], [address] Phone: [phone]   Fax:  [phone]  Physical Therapy Evaluation  Patient Details  Name: Nathaniel Jones MRN: [id number] Date of Birth: 04-30-1981 Referring Provider (PT): Doneen Poisson, MD   Encounter Date: 01/23/2020  PT End of Session - 01/23/20 1621    Visit Number  1    Number of Visits  13    Date for PT Re-Evaluation  03/06/20    PT Start Time  1530    PT Stop Time  1615    PT Time Calculation (min)  45 min    Activity Tolerance  Patient tolerated treatment well    Behavior During Therapy  [facility] for tasks assessed/performed       Past Medical History:  Diagnosis Date  . Hypertension     Past Surgical History:  Procedure Laterality Date  . KNEE SURGERY Right   . none      There were no vitals filed for this visit.   Subjective Assessment - 01/23/20 1536    Subjective  Pt arriving to therapy reporting low back pain of 5-6/10 at rest. Pt reporting last month his pain incressed to 9/10. Pt reporting pain down L side. Pt reporting CT scan was negative and pt scheduled for MRI on Monday afternoon 01/27/2020.    Pertinent History  HTN, R knee scope about 2016-2017, partial tears in rotator cuffs bilaterally    How long can you sit comfortably?  10-15 minutes    How long can you stand comfortably?  5-10 minutes    How long can you walk comfortably?  pt reports he is able to walk through the pain    Diagnostic tests  CT scan was negative, MRI scheudled on Monday 01/28/2020    Patient Stated Goals  play with my son    Currently in Pain?  Yes    Pain Score  6     Pain Location  Back    Pain Orientation  Left    Pain Descriptors / Indicators  Burning;Other (Comment);Sharp    Pain Type  Chronic pain    Pain Radiating Towards  down L LE, pt reporting numbness down L lateral thigh    Pain Onset  More than a month ago    Pain Frequency  Constant    Aggravating Factors   sitting  long periods    Pain Relieving Factors  pain meds, massage    Effect of Pain on Daily Activities  unable to sit in bed, bending over is very difficulty,         [facility] PT Assessment - 01/23/20 0001      Assessment   Medical Diagnosis  low back pain left sided sciatica M54.42    Referring Provider (PT)  Doneen Poisson, MD    Onset Date/Surgical Date  --   years ago   Next MD Visit  f/u after therapy    Prior Therapy  no      Precautions   Precautions  None      Restrictions   Weight Bearing Restrictions  No      Balance Screen   Has the patient fallen in the past 6 months  No    Is the patient reluctant to leave their home because of a fear of falling?   No      Home Environment   Living Environment  Private residence    Type of Home  House    Home  Access  Level entry      Prior Function   Level of Independence  Independent    Vocation  Full time employment    Vocation Requirements  works for the city of [place]    Leisure  play football with my son      Cognition   Overall Cognitive Status  Within Functional Limits for tasks assessed      Posture/Postural Control   Posture/Postural Control  Postural limitations    Postural Limitations  Rounded Shoulders;Forward head      ROM / Strength   AROM / PROM / Strength  AROM;Strength      AROM   AROM Assessment Site  Lumbar    Lumbar Flexion  70    Lumbar Extension  30    Lumbar - Right Side Bend  32    Lumbar - Left Side Bend  38    Lumbar - Right Rotation  limited 25%    sharp pain down left low back   Lumbar - Left Rotation  [facility]      Strength   Strength Assessment Site  Hip;Knee    Right/Left Hip  Right;Left    Right Hip Flexion  5/5    Right Hip Extension  5/5    Right Hip ABduction  5/5    Right Hip ADduction  5/5    Left Hip Flexion  4+/5    Left Hip Extension  4+/5    Left Hip ABduction  4+/5    Left Hip ADduction  4+/5    Right/Left Knee  Right;Left    Right Knee Flexion  5/5    Right Knee  Extension  5/5    Left Knee Flexion  4+/5    Left Knee Extension  5/5      Flexibility   Soft Tissue Assessment /Muscle Length  yes    Hamstrings  R: 75 degrees, L: 70 degrees,    opposite leg straight   ITB  tightness noted      Palpation   Palpation comment  TTP: L2-L5 lumbar paraspinals on L side      Special Tests   Other special tests  + Thomas test bilaterally      Ambulation/Gait   Gait Comments  wide BOS, step through gait pattern                Objective measurements completed on examination: See above findings.              PT Education - 01/23/20 1620    Education Details  PT POC, HEP    Person(s) Educated  Patient    Methods  Explanation;Handout;Demonstration    Comprehension  Verbalized understanding;Returned demonstration          PT Long Term Goals - 01/23/20 1623      PT LONG TERM GOAL #1   Title  Pt will be independent in his HEP and progression.    Time  6    Period  Weeks    Status  New    Target Date  03/06/20      PT LONG TERM GOAL #2   Title  Pt will be able to tolerate sitting for >/= 30 minutes with pain </=2/10.    Baseline  6-7/10, only tolerating 5-10 minutes    Time  6    Period  Weeks    Status  New      PT LONG TERM GOAL #3   Title  Pt  will improve right trunk rotation (25% limitation) to improve functional mobillity.    Baseline  25% limitation compared to left rotation    Time  6    Period  Weeks    Status  New      PT LONG TERM GOAL #4   Title  Pt will be able to walk 1 mile with pain </=2/10 in less 14 minutes.    Baseline  pain varies    Time  6    Period  Weeks    Status  New      PT LONG TERM GOAL #5   Title  Pt will be able to lift 25# from floor to counter height with no pain.    Baseline  pain with bending    Time  6    Period  Weeks    Status  New             Plan - 01/23/20 1641    Clinical Impression Statement  Landry arriving to therpay for evaluation of chronic low back pain  with left sided sciaica. Pt reporting his CT scan was clear, but he is scheduled for MRI on Monday. Pt reporting 6/10 pain today upon arrival but pain can increase to 9/10 at times. Pt reporting unable to sit up in bed without pain, prolonged standing increases pain and lifting/bending increases pain. Pt with mild weakness noted in L LE compared to R LE. Pt responding well to prone on elbows and long axis L LE distraction. Pt reporting relief at end of session. Skilled PT needed to address pt's impairments with the below interventions.    Examination-Activity Limitations  Squat;Stand;Lift    Examination-Participation Restrictions  Tour manager    Stability/Clinical Decision Making  Stable/Uncomplicated    Clinical Decision Making  Low    Rehab Potential  Good    PT Frequency  2x / week    PT Duration  6 weeks    PT Treatment/Interventions  Electrical Stimulation;Iontophoresis 4mg /ml Dexamethasone;Moist Heat;Traction;Ultrasound;Cryotherapy;Stair training;Functional mobility training;Therapeutic activities;Therapeutic exercise;Balance training;Neuromuscular re-education;Patient/family education;Manual techniques;Passive range of motion;Dry needling;Taping    PT Next Visit Plan  warm up on bike, Long axis distraction, prone on elbows, mechanical traction, Hamstring stretches, IT band stretches, Hip flexor stretches, core strengtheing    PT Home Exercise Plan  [email or web address]    Consulted and Agree with Plan of Care  Patient       Patient will benefit from skilled therapeutic intervention in order to improve the following deficits and impairments:  Pain, Impaired flexibility, Decreased strength, Decreased activity tolerance, Decreased range of motion, Difficulty walking  Visit Diagnosis: Chronic left-sided low back pain with left-sided sciatica  Muscle weakness (generalized)  Difficulty in walking, not elsewhere classified     Problem List Patient Active Problem List   Diagnosis Date Noted  .  Unilateral primary osteoarthritis, left knee 05/17/2018  . Unilateral primary osteoarthritis, right knee 05/17/2018  . Chronic pain of both knees 05/17/2018  . Biceps rupture, proximal, right, initial encounter 05/22/2017  . Chronic bilateral low back pain without sciatica 03/08/2017  . Lateral epicondylitis, right elbow 03/08/2017  . It band syndrome, left 03/08/2017    Oretha Caprice, MPT 01/23/2020, 4:52 PM  [facility] Physical Therapy [address] [place], [place], [address] Phone: [phone]   Fax:  [phone]  Name: STONEWALL DOSS MRN: [id number] Date of Birth: Mar 22, 1981

## 2020-01-23 NOTE — Patient Instructions (Signed)
Access Code: [email or web address] URL: [email or web address] Date: 01/23/2020 Prepared by: Narda Amber  Exercises Supine Single Knee to Chest Stretch - 2 x daily - 7 x weekly - 3 reps - 30 seconds hold Hooklying Hamstring Stretch with Strap - 2 x daily - 7 x weekly - 3-5 reps - 30 seconds hold Supine ITB Stretch with Strap - 2 x daily - 7 x weekly - 3-5 reps - 30 seconds hold Prone Press Up on Elbows - 2 x daily - 7 x weekly - 2 minutes hold Hip Flexor Stretch at Edge of Bed - 2 x daily - 7 x weekly - 3-5 reps - 30 seconds hold

## 2020-01-27 ENCOUNTER — Ambulatory Visit: Payer: 59 | Admitting: Rehabilitative and Restorative Service Providers"

## 2020-01-27 ENCOUNTER — Other Ambulatory Visit: Payer: Self-pay

## 2020-01-27 ENCOUNTER — Encounter: Payer: Self-pay | Admitting: Rehabilitative and Restorative Service Providers"

## 2020-01-27 DIAGNOSIS — M6281 Muscle weakness (generalized): Secondary | ICD-10-CM

## 2020-01-27 DIAGNOSIS — M5442 Lumbago with sciatica, left side: Secondary | ICD-10-CM

## 2020-01-27 DIAGNOSIS — R262 Difficulty in walking, not elsewhere classified: Secondary | ICD-10-CM

## 2020-01-27 DIAGNOSIS — R293 Abnormal posture: Secondary | ICD-10-CM | POA: Diagnosis not present

## 2020-01-27 DIAGNOSIS — G8929 Other chronic pain: Secondary | ICD-10-CM

## 2020-01-27 NOTE — Therapy (Signed)
[facility] Physical Therapy [address] [place], [place], [address] Phone: [phone]   Fax:  [phone]  Physical Therapy Treatment  Patient Details  Name: Nathaniel Jones MRN: [id number] Date of Birth: 1981-08-07 Referring Provider (PT): Doneen Poisson, MD   Encounter Date: 01/27/2020    Past Medical History:  Diagnosis Date  . Hypertension     Past Surgical History:  Procedure Laterality Date  . KNEE SURGERY Right   . none      There were no vitals filed for this visit.  Subjective Assessment - 01/27/20 0844    Subjective  Thornton notes consistent LBP and L leg pain as distal as the knee.  He reports compliance with his HEP over the weekend.    Pertinent History  HTN, R knee scope about 2016-2017, partial tears in rotator cuffs bilaterally    How long can you sit comfortably?  10-15 minutes    How long can you stand comfortably?  5-10 minutes    How long can you walk comfortably?  pt reports he is able to walk through the pain    Diagnostic tests  CT scan was negative, MRI scheudled on Monday 01/28/2020    Patient Stated Goals  play with my son    Pain Score  6     Pain Orientation  Left    Pain Descriptors / Indicators  Burning    Pain Onset  More than a month ago                       [facility] Adult PT Treatment/Exercise - 01/27/20 0001      Therapeutic Activites    Therapeutic Activities  ADL's   Extensive spine anatomy/postural education on treadmill    ADL's  --   Treadmill with education 2-3 MPH 8 minutes     Exercises   Exercises  --   Standing trunk extension AROM (emphasis on hips forward)     Lumbar Exercises: Stretches   Active Hamstring Stretch  4 reps;20 seconds    Single Knee to Chest Stretch  4 reps;20 seconds   Other leg straight     Lumbar Exercises: Aerobic   Tread Mill  With spine/postural education 2-3 MPH 8 minutes      Lumbar Exercises: Standing   Other Standing Lumbar Exercises  Standing trunk  extension (hips forward) 4 sets of 5  3 seconds    Other Standing Lumbar Exercises  Scapular retraction 4 sets of 5  5 seconds      Lumbar Exercises: Prone   Other Prone Lumbar Exercises  Prone alternating hip extensions (forehead on forearms) 2 sets of 10  3 second hold             PT Education - 01/27/20 0945    Education Details  Extensive spine education and postural education during and after the treadmill.  Discussed the importance of frequent changes of position, correct use of lumbar roll and importance of walking along with spine anatomy and pathology of the disc and sciatica.    Person(s) Educated  Patient    Methods  Explanation;Demonstration;Verbal cues;Handout    Comprehension  Verbalized understanding;Returned demonstration;Verbal cues required;Need further instruction          PT Long Term Goals - 01/23/20 1623      PT LONG TERM GOAL #1   Title  Pt will be independent in his HEP and progression.    Time  6    Period  Weeks    Status  New    Target Date  03/06/20      PT LONG TERM GOAL #2   Title  Pt will be able to tolerate sitting for >/= 30 minutes with pain </=2/10.    Baseline  6-7/10, only tolerating 5-10 minutes    Time  6    Period  Weeks    Status  New      PT LONG TERM GOAL #3   Title  Pt will improve right trunk rotation (25% limitation) to improve functional mobillity.    Baseline  25% limitation compared to left rotation    Time  6    Period  Weeks    Status  New      PT LONG TERM GOAL #4   Title  Pt will be able to walk 1 mile with pain </=2/10 in less 14 minutes.    Baseline  pain varies    Time  6    Period  Weeks    Status  New      PT LONG TERM GOAL #5   Title  Pt will be able to lift 25# from floor to counter height with no pain.    Baseline  pain with bending    Time  6    Period  Weeks    Status  New              Patient will benefit from skilled therapeutic intervention in order to improve the following  deficits and impairments:     Visit Diagnosis: Chronic left-sided low back pain with left-sided sciatica  Muscle weakness (generalized)  Difficulty in walking, not elsewhere classified  Posture abnormality     Problem List Patient Active Problem List   Diagnosis Date Noted  . Unilateral primary osteoarthritis, left knee 05/17/2018  . Unilateral primary osteoarthritis, right knee 05/17/2018  . Chronic pain of both knees 05/17/2018  . Biceps rupture, proximal, right, initial encounter 05/22/2017  . Chronic bilateral low back pain without sciatica 03/08/2017  . Lateral epicondylitis, right elbow 03/08/2017  . It band syndrome, left 03/08/2017    Farley Ly MPT 01/27/2020, 9:47 AM  [facility] Physical Therapy [address] [place], [place], [address] Phone: [phone]   Fax:  [phone]  Name: Nathaniel Jones MRN: [id number] Date of Birth: May 06, 1981

## 2020-01-27 NOTE — Patient Instructions (Signed)
Access Code: [email or web address]: [email or web address]: 04/26/2021Prepared by: Arlys [name] NelsonExercises  Supine Hamstring Stretch - 2 x daily - 6 x weekly - 1 sets - 4 reps - 20 hold  Supine Single Knee to Chest Stretch - 2 x daily - 6 x weekly - 1 sets - 4 reps - 20 hold  Standing Scapular Retraction - 5 x daily - 6 x weekly - 1 sets - 5 reps - 5 hold  Standing Lumbar Extension at Wall - Forearms - 5 x daily - 6 x weekly - 1 sets - 5 reps - 3 hold  Prone Hip Extension - 2 x daily - 6 x weekly - 2 sets - 10 reps

## 2020-01-29 ENCOUNTER — Ambulatory Visit: Payer: 59 | Admitting: Orthopaedic Surgery

## 2020-01-31 ENCOUNTER — Encounter: Payer: 59 | Admitting: Physical Therapy

## 2020-02-03 ENCOUNTER — Other Ambulatory Visit: Payer: Self-pay

## 2020-02-03 ENCOUNTER — Ambulatory Visit: Payer: 59 | Admitting: Rehabilitative and Restorative Service Providers"

## 2020-02-03 ENCOUNTER — Encounter: Payer: Self-pay | Admitting: Rehabilitative and Restorative Service Providers"

## 2020-02-03 DIAGNOSIS — R293 Abnormal posture: Secondary | ICD-10-CM | POA: Diagnosis not present

## 2020-02-03 DIAGNOSIS — R262 Difficulty in walking, not elsewhere classified: Secondary | ICD-10-CM

## 2020-02-03 DIAGNOSIS — G8929 Other chronic pain: Secondary | ICD-10-CM

## 2020-02-03 DIAGNOSIS — M6281 Muscle weakness (generalized): Secondary | ICD-10-CM | POA: Diagnosis not present

## 2020-02-03 DIAGNOSIS — M5442 Lumbago with sciatica, left side: Secondary | ICD-10-CM | POA: Diagnosis not present

## 2020-02-03 NOTE — Patient Instructions (Signed)
Access Code: [email or web address] URL: [email or web address] Date: 02/03/2020 Prepared by: Pauletta Browns  Exercises Prone Alternating Arm and Leg Lifts - 1 x daily - 7 x weekly - 2 sets - 10 reps - 5-8 seconds hold Prone Bilateral Arm and Leg Lift - 1 x daily - 7 x weekly - 1-2 sets - 10 reps - 3-8 hold

## 2020-02-03 NOTE — Therapy (Signed)
[facility] Physical Therapy [address] [place], [place], [address] Phone: [phone]   Fax:  [phone]  Physical Therapy Treatment  Patient Details  Name: Nathaniel Jones MRN: [id number] Date of Birth: 08/22/1981 Referring Provider (PT): Doneen Poisson, MD   Encounter Date: 02/03/2020  PT End of Session - 02/03/20 0859    Visit Number  3    Number of Visits  13    Date for PT Re-Evaluation  03/06/20    PT Start Time  0801    PT Stop Time  0848    PT Time Calculation (min)  47 min    Activity Tolerance  Patient tolerated treatment well    Behavior During Therapy  [facility] for tasks assessed/performed       Past Medical History:  Diagnosis Date  . Hypertension     Past Surgical History:  Procedure Laterality Date  . KNEE SURGERY Right   . none      There were no vitals filed for this visit.  Subjective Assessment - 02/03/20 0852    Subjective  [name] notes 1 episode of brief sciatica since his last visit.  He was sitting on a soft bed with no support playing video games with his son.    Pertinent History  HTN, R knee scope about 2016-2017, partial tears in rotator cuffs bilaterally    How long can you sit comfortably?  10-15 minutes    How long can you stand comfortably?  5-10 minutes    How long can you walk comfortably?  pt reports he is able to walk through the pain    Diagnostic tests  CT scan was negative, MRI scheudled on Monday 01/28/2020    Patient Stated Goals  play with my son    Currently in Pain?  No/denies    Pain Onset  More than a month ago    Aggravating Factors   Prolonged postures    Pain Relieving Factors  Change of position and exercise    Effect of Pain on Daily Activities  Needs to carefully watch posture/body mechanics and change position frequently                       [facility] Adult PT Treatment/Exercise - 02/03/20 0001      Therapeutic Activites    Therapeutic Activities  ADL's    ADL's  Reviewed log  roll in bed, lumbar roll use, frequent change of position and importance of walking program      Exercises   Exercises  Lumbar      Lumbar Exercises: Stretches   Active Hamstring Stretch  5 reps;20 seconds    Single Knee to Chest Stretch  5 reps;20 seconds   Keep opposite leg straight     Lumbar Exercises: Standing   Side Lunge  2 seconds;5 reps   Hip abduction with pelvic stabilization (quadratus lumborum)   Row  --   PRX pull to chest 20X   Other Standing Lumbar Exercises  Standing trunk extension (hips forward) 4 sets of 5  3 seconds    Other Standing Lumbar Exercises  Scapular retraction 4 sets of 5  5 seconds      Lumbar Exercises: Prone   Opposite Arm/Leg Raise Limitations  Probe B arm and leg raise (superman) 10X 3 seconds (increase time and sets next visit)    Other Prone Lumbar Exercises  Prone alternating hip extensions (forehead on forearms) 1 set of 10  8 second hold  Other Prone Lumbar Exercises  Prone alternating arm and leg extensions 2 sets of 10 (5 and 8 second holds)             PT Education - 02/03/20 0858    Education Details  Reviewed log and lumbar roll, walking program and the importance of avoiding bending and twisting.    Person(s) Educated  Patient    Methods  Explanation;Demonstration    Comprehension  Verbalized understanding;Returned demonstration;Need further instruction          PT Long Term Goals - 01/23/20 1623      PT LONG TERM GOAL #1   Title  Pt will be independent in his HEP and progression.    Time  6    Period  Weeks    Status  New    Target Date  03/06/20      PT LONG TERM GOAL #2   Title  Pt will be able to tolerate sitting for >/= 30 minutes with pain </=2/10.    Baseline  6-7/10, only tolerating 5-10 minutes    Time  6    Period  Weeks    Status  New      PT LONG TERM GOAL #3   Title  Pt will improve right trunk rotation (25% limitation) to improve functional mobillity.    Baseline  25% limitation compared to  left rotation    Time  6    Period  Weeks    Status  New      PT LONG TERM GOAL #4   Title  Pt will be able to walk 1 mile with pain </=2/10 in less 14 minutes.    Baseline  pain varies    Time  6    Period  Weeks    Status  New      PT LONG TERM GOAL #5   Title  Pt will be able to lift 25# from floor to counter height with no pain.    Baseline  pain with bending    Time  6    Period  Weeks    Status  New            Plan - 02/03/20 0859    Clinical Impression Statement  Continue to progress prone and standing strengthening programs to reduce frequeny and intensity of sciatica which limits Bernardino with his sitting and prolonged ADLs.    Examination-Activity Limitations  Squat;Stand;Lift    Examination-Participation Restrictions  Contractor    PT Next Visit Plan  Continue education and core strengthening.  Constantly update and correct HEP.    PT Home Exercise Plan  Access Code: [email or web address]: [email or web address]: 05/03/2021Prepared by: Molly Maduro LovellExercisesProne Alternating Arm and Leg Lifts - 1 x daily - 7 x weekly - 2 sets - 10 reps - 5-8 seconds holdProne Bilateral Arm and Leg Lift - 1 x daily - 7 x weekly - 1-2 sets - 10 reps - 3-8 hold       Patient will benefit from skilled therapeutic intervention in order to improve the following deficits and impairments:  Pain, Impaired flexibility, Decreased strength, Decreased activity tolerance, Decreased range of motion, Difficulty walking  Visit Diagnosis: Chronic left-sided low back pain with left-sided sciatica  Muscle weakness (generalized)  Difficulty in walking, not elsewhere classified  Posture abnormality     Problem List Patient Active Problem List   Diagnosis Date Noted  . Unilateral primary osteoarthritis, left knee 05/17/2018  . Unilateral primary osteoarthritis, right knee 05/17/2018  .  Chronic pain of both knees 05/17/2018  . Biceps rupture, proximal, right, initial encounter  05/22/2017  . Chronic bilateral low back pain without sciatica 03/08/2017  . Lateral epicondylitis, right elbow 03/08/2017  . It band syndrome, left 03/08/2017    Farley Ly PT, MPT 02/03/2020, 9:04 AM  [facility] Physical Therapy [address] [place], [place], [address] Phone: [phone]   Fax:  [phone]  Name: MORTY ORTWEIN MRN: [id number] Date of Birth: 1981/07/25

## 2020-02-05 ENCOUNTER — Ambulatory Visit (INDEPENDENT_AMBULATORY_CARE_PROVIDER_SITE_OTHER): Payer: 59 | Admitting: Physical Therapy

## 2020-02-05 ENCOUNTER — Encounter: Payer: 59 | Admitting: Rehabilitative and Restorative Service Providers"

## 2020-02-05 ENCOUNTER — Other Ambulatory Visit: Payer: Self-pay

## 2020-02-05 ENCOUNTER — Encounter: Payer: Self-pay | Admitting: Physical Therapy

## 2020-02-05 DIAGNOSIS — M6281 Muscle weakness (generalized): Secondary | ICD-10-CM | POA: Diagnosis not present

## 2020-02-05 DIAGNOSIS — R293 Abnormal posture: Secondary | ICD-10-CM | POA: Diagnosis not present

## 2020-02-05 DIAGNOSIS — R262 Difficulty in walking, not elsewhere classified: Secondary | ICD-10-CM | POA: Diagnosis not present

## 2020-02-05 DIAGNOSIS — M5442 Lumbago with sciatica, left side: Secondary | ICD-10-CM

## 2020-02-05 DIAGNOSIS — G8929 Other chronic pain: Secondary | ICD-10-CM

## 2020-02-05 NOTE — Therapy (Signed)
[facility] Therapy [address] [place], [place], [address] Phone: [phone]   Fax:  [phone]  Physical Therapy Treatment  Patient Details  Name: Nathaniel Jones MRN: [id number] Date of Birth: 12/01/1980 Referring Provider (PT): Doneen Poisson, MD   Encounter Date: 02/05/2020  PT End of Session - 02/05/20 1245    Visit Number  4    Number of Visits  13    Date for PT Re-Evaluation  03/06/20    PT Start Time  1147    PT Stop Time  1225    PT Time Calculation (min)  38 min    Activity Tolerance  Patient tolerated treatment well    Behavior During Therapy  [facility] for tasks assessed/performed       Past Medical History:  Diagnosis Date  . Hypertension     Past Surgical History:  Procedure Laterality Date  . KNEE SURGERY Right   . none      There were no vitals filed for this visit.  Subjective Assessment - 02/05/20 1150    Subjective  increased tightness today when he woke up, not radiating down the leg though.    Pertinent History  HTN, R knee scope about 2016-2017, partial tears in rotator cuffs bilaterally    How long can you sit comfortably?  10-15 minutes    How long can you stand comfortably?  5-10 minutes    How long can you walk comfortably?  pt reports he is able to walk through the pain    Diagnostic tests  CT scan was negative, MRI scheudled on Monday 01/28/2020    Patient Stated Goals  play with my son    Currently in Pain?  No/denies    Pain Onset  More than a month ago                       [facility] Adult PT Treatment/Exercise - 02/05/20 1151      Lumbar Exercises: Stretches   Standing Extension  10 reps;10 seconds    Other Lumbar Stretch Exercise  standing and seated Lt QL stretch 3x30 sec each      Lumbar Exercises: Aerobic   Tread Mill  2.3 mph x 5 min      Lumbar Exercises: Standing   Other Standing Lumbar Exercises  Scapular retraction 4 sets of 5  5 seconds      Manual Therapy   Manual Therapy   Soft tissue mobilization    Manual therapy comments  skilled palpation and monitoring of soft tissue during DN    Soft tissue mobilization  Lt QL       Trigger Point Dry Needling - 02/05/20 1211    Consent Given?  Yes    Education Handout Provided  Yes    Muscles Treated Back/Hip  Quadratus lumborum    Quadratus Lumborum Response  Twitch response elicited;Palpable increased muscle length           PT Education - 02/05/20 1245    Education Details  DN    Person(s) Educated  Patient    Methods  Explanation;Handout    Comprehension  Verbalized understanding          PT Long Term Goals - 01/23/20 1623      PT LONG TERM GOAL #1   Title  Pt will be independent in his HEP and progression.    Time  6    Period  Weeks    Status  New  Target Date  03/06/20      PT LONG TERM GOAL #2   Title  Pt will be able to tolerate sitting for >/= 30 minutes with pain </=2/10.    Baseline  6-7/10, only tolerating 5-10 minutes    Time  6    Period  Weeks    Status  New      PT LONG TERM GOAL #3   Title  Pt will improve right trunk rotation (25% limitation) to improve functional mobillity.    Baseline  25% limitation compared to left rotation    Time  6    Period  Weeks    Status  New      PT LONG TERM GOAL #4   Title  Pt will be able to walk 1 mile with pain </=2/10 in less 14 minutes.    Baseline  pain varies    Time  6    Period  Weeks    Status  New      PT LONG TERM GOAL #5   Title  Pt will be able to lift 25# from floor to counter height with no pain.    Baseline  pain with bending    Time  6    Period  Weeks    Status  New            Plan - 02/05/20 1245    Clinical Impression Statement  Pt arrived today with c/o Lt QL tightness, different that initial pain he has had.  Pt with active trigger points so treated today with manual/DN and stretches with reduction in pain and tightness following.  Will continue to benefit from PT to maximize function.     Examination-Activity Limitations  Squat;Stand;Lift    Examination-Participation Restrictions  Tour manager    PT Next Visit Plan  Continue education and core strengthening.  Constantly update and correct HEP, assess response to DN    PT Home Exercise Plan  Access Code: [email or web address]: [email or web address]: 05/03/2021Prepared by: Herbie Baltimore LovellExercisesProne Alternating Arm and Leg Lifts - 1 x daily - 7 x weekly - 2 sets - 10 reps - 5-8 seconds holdProne Bilateral Arm and Leg Lift - 1 x daily - 7 x weekly - 1-2 sets - 10 reps - 3-8 hold       Patient will benefit from skilled therapeutic intervention in order to improve the following deficits and impairments:  Pain, Impaired flexibility, Decreased strength, Decreased activity tolerance, Decreased range of motion, Difficulty walking  Visit Diagnosis: Chronic left-sided low back pain with left-sided sciatica  Muscle weakness (generalized)  Difficulty in walking, not elsewhere classified  Posture abnormality     Problem List Patient Active Problem List   Diagnosis Date Noted  . Unilateral primary osteoarthritis, left knee 05/17/2018  . Unilateral primary osteoarthritis, right knee 05/17/2018  . Chronic pain of both knees 05/17/2018  . Biceps rupture, proximal, right, initial encounter 05/22/2017  . Chronic bilateral low back pain without sciatica 03/08/2017  . Lateral epicondylitis, right elbow 03/08/2017  . It band syndrome, left 03/08/2017      Laureen Abrahams, PT, DPT 02/05/20 12:47 PM     [facility] Physical Therapy [address] [place], [place], [address] Phone: [phone]   Fax:  [phone]  Name: Nathaniel Jones MRN: [id number] Date of Birth: [birth year]

## 2020-02-10 ENCOUNTER — Encounter: Payer: 59 | Admitting: Physical Therapy

## 2020-02-12 ENCOUNTER — Encounter: Payer: 59 | Admitting: Physical Therapy

## 2020-02-17 ENCOUNTER — Ambulatory Visit (INDEPENDENT_AMBULATORY_CARE_PROVIDER_SITE_OTHER): Payer: 59 | Admitting: Physical Therapy

## 2020-02-17 ENCOUNTER — Other Ambulatory Visit: Payer: Self-pay

## 2020-02-17 ENCOUNTER — Encounter: Payer: Self-pay | Admitting: Physical Therapy

## 2020-02-17 DIAGNOSIS — R262 Difficulty in walking, not elsewhere classified: Secondary | ICD-10-CM

## 2020-02-17 DIAGNOSIS — R293 Abnormal posture: Secondary | ICD-10-CM

## 2020-02-17 DIAGNOSIS — M6281 Muscle weakness (generalized): Secondary | ICD-10-CM | POA: Diagnosis not present

## 2020-02-17 DIAGNOSIS — M5442 Lumbago with sciatica, left side: Secondary | ICD-10-CM

## 2020-02-17 DIAGNOSIS — G8929 Other chronic pain: Secondary | ICD-10-CM

## 2020-02-17 NOTE — Therapy (Signed)
[facility] Physical Therapy [address] [place], [place], [address] Phone: [phone]   Fax:  [phone]  Physical Therapy Treatment  Patient Details  Name: Nathaniel Jones MRN: [id number] Date of Birth: Nov 30, 1980 Referring Provider (PT): Doneen Poisson, MD   Encounter Date: 02/17/2020  PT End of Session - 02/17/20 0809    Visit Number  5    Number of Visits  13    Date for PT Re-Evaluation  03/06/20    PT Start Time  0802    PT Stop Time  0845    PT Time Calculation (min)  43 min    Activity Tolerance  Patient tolerated treatment well    Behavior During Therapy  [facility] for tasks assessed/performed       Past Medical History:  Diagnosis Date  . Hypertension     Past Surgical History:  Procedure Laterality Date  . KNEE SURGERY Right   . none      There were no vitals filed for this visit.  Subjective Assessment - 02/17/20 0808    Subjective  Pt reporting more stiffness over the weekend. Pt with no reports of radiating pain down his left LE today.    Pertinent History  HTN, R knee scope about 2016-2017, partial tears in rotator cuffs bilaterally    Patient Stated Goals  play with my son    Currently in Pain?  Yes    Pain Score  3     Pain Location  Back    Pain Orientation  Lower    Pain Descriptors / Indicators  Sore;Tightness    Pain Type  Chronic pain    Pain Onset  More than a month ago                        [facility] Adult PT Treatment/Exercise - 02/17/20 0001      Lumbar Exercises: Stretches   Active Hamstring Stretch Limitations  3 reps x 30 second holds    Standing Extension  10 reps;10 seconds    Piriformis Stretch  3 reps;20 seconds      Lumbar Exercises: Aerobic   Tread Mill  2.3 mph x 5 min      Lumbar Exercises: Standing   Other Standing Lumbar Exercises  Scapular retraction 4 sets of 5  5 seconds      Modalities   Modalities  Traction      Traction   Type of Traction  Lumbar    Min (lbs)  100    Max (lbs)  150    Hold Time  60 seconds    Rest Time  20 seconds    Time  20 minutes total time                  PT Long Term Goals - 02/17/20 0809      PT LONG TERM GOAL #1   Title  Pt will be independent in his HEP and progression.    Time  6    Period  Weeks    Status  On-going      PT LONG TERM GOAL #2   Title  Pt will be able to tolerate sitting for >/= 30 minutes with pain </=2/10.    Baseline  6-7/10, only tolerating 5-10 minutes    Status  On-going      PT LONG TERM GOAL #3   Title  Pt will improve right trunk rotation (25% limitation) to improve functional mobillity.  Status  On-going      PT LONG TERM GOAL #4   Title  Pt will be able to walk 1 mile with pain </=2/10 in less 14 minutes.    Status  On-going      PT LONG TERM GOAL #5   Title  Pt will be able to lift 25# from floor to counter height with no pain.    Status  On-going            Plan - 02/17/20 4193    Clinical Impression Statement  Pt arriving to therapy reproting good response to DN and no pain radiating down his L LE. Mechanical Traction performed today with good results with maximum pull at 140 pounds. Progress to 50% of pt's body weight at next visit. Continue to assess need for further DN at future visit. Continue with lumbar and LE stretching and core strengthening.    Examination-Activity Limitations  Squat;Stand;Lift    Examination-Participation Restrictions  Tour manager    Stability/Clinical Decision Making  Stable/Uncomplicated    Rehab Potential  Good    PT Frequency  2x / week    PT Duration  6 weeks    PT Treatment/Interventions  Electrical Stimulation;Iontophoresis 4mg /ml Dexamethasone;Moist Heat;Traction;Ultrasound;Cryotherapy;Stair training;Functional mobility training;Therapeutic activities;Therapeutic exercise;Balance training;Neuromuscular re-education;Patient/family education;Manual techniques;Passive range of motion;Dry needling;Taping    PT Next Visit Plan   Continue education and core strengthening.  Constantly update and correct HEP, assess response to DN    PT Home Exercise Plan  Access Code: [email or web address]: [email or web address]: 05/03/2021Prepared by: Herbie Baltimore LovellExercisesProne Alternating Arm and Leg Lifts - 1 x daily - 7 x weekly - 2 sets - 10 reps - 5-8 seconds holdProne Bilateral Arm and Leg Lift - 1 x daily - 7 x weekly - 1-2 sets - 10 reps - 3-8 hold    Consulted and Agree with Plan of Care  Patient       Patient will benefit from skilled therapeutic intervention in order to improve the following deficits and impairments:  Pain, Impaired flexibility, Decreased strength, Decreased activity tolerance, Decreased range of motion, Difficulty walking  Visit Diagnosis: Chronic left-sided low back pain with left-sided sciatica  Muscle weakness (generalized)  Difficulty in walking, not elsewhere classified  Posture abnormality     Problem List Patient Active Problem List   Diagnosis Date Noted  . Unilateral primary osteoarthritis, left knee 05/17/2018  . Unilateral primary osteoarthritis, right knee 05/17/2018  . Chronic pain of both knees 05/17/2018  . Biceps rupture, proximal, right, initial encounter 05/22/2017  . Chronic bilateral low back pain without sciatica 03/08/2017  . Lateral epicondylitis, right elbow 03/08/2017  . It band syndrome, left 03/08/2017    Oretha Caprice PT, MPT 02/17/2020, 8:44 AM  [facility] Physical Therapy [address] [place], [place], [address] Phone: [phone]   Fax:  [phone]  Name: Nathaniel Jones MRN: [id number] Date of Birth: 1981-07-09

## 2020-02-19 ENCOUNTER — Ambulatory Visit (INDEPENDENT_AMBULATORY_CARE_PROVIDER_SITE_OTHER): Payer: 59 | Admitting: Physical Therapy

## 2020-02-19 ENCOUNTER — Encounter: Payer: Self-pay | Admitting: Physical Therapy

## 2020-02-19 ENCOUNTER — Other Ambulatory Visit: Payer: Self-pay

## 2020-02-19 DIAGNOSIS — R262 Difficulty in walking, not elsewhere classified: Secondary | ICD-10-CM

## 2020-02-19 DIAGNOSIS — M5442 Lumbago with sciatica, left side: Secondary | ICD-10-CM

## 2020-02-19 DIAGNOSIS — R293 Abnormal posture: Secondary | ICD-10-CM | POA: Diagnosis not present

## 2020-02-19 DIAGNOSIS — M6281 Muscle weakness (generalized): Secondary | ICD-10-CM | POA: Diagnosis not present

## 2020-02-19 DIAGNOSIS — G8929 Other chronic pain: Secondary | ICD-10-CM

## 2020-02-19 NOTE — Therapy (Signed)
[facility] Physical Therapy [address] [place], [place], [address] Phone: [phone]   Fax:  [phone]  Physical Therapy Treatment  Patient Details  Name: Nathaniel Jones MRN: [id number] Date of Birth: 1981-09-02 Referring Provider (PT): Jean Rosenthal, MD   Encounter Date: 02/19/2020  PT End of Session - 02/19/20 0821    Visit Number  6    Number of Visits  13    Date for PT Re-Evaluation  03/06/20    PT Start Time  0803    PT Stop Time  0838    PT Time Calculation (min)  35 min    Activity Tolerance  Patient tolerated treatment well    Behavior During Therapy  [facility] for tasks assessed/performed       Past Medical History:  Diagnosis Date  . Hypertension     Past Surgical History:  Procedure Laterality Date  . KNEE SURGERY Right   . none      There were no vitals filed for this visit.  Subjective Assessment - 02/19/20 0820    Subjective  Pt arriving reporting feeling great after yesterday's session. Pt requesting to just do the traction today. Pt said his relief lasted until he decided to go for a run.    Pertinent History  HTN, R knee scope about 2016-2017, partial tears in rotator cuffs bilaterally    How long can you sit comfortably?  10-15 minutes    How long can you stand comfortably?  5-10 minutes    How long can you walk comfortably?  pt reports he is able to walk through the pain    Diagnostic tests  CT scan was negative, MRI scheudled on Monday 01/28/2020    Patient Stated Goals  play with my son    Currently in Pain?  No/denies                        [facility] Adult PT Treatment/Exercise - 02/19/20 0001      Lumbar Exercises: Stretches   Active Hamstring Stretch Limitations  2 reps x 30 second holds    Standing Extension  3 reps;10 seconds      Traction   Type of Traction  Lumbar    Min (lbs)  120    Max (lbs)  155    Hold Time  60    Rest Time  20    Time  25 minutes total                   PT Long Term Goals - 02/19/20 0824      PT LONG TERM GOAL #1   Title  Pt will be independent in his HEP and progression.    Time  6    Period  Weeks    Status  On-going      PT LONG TERM GOAL #2   Title  Pt will be able to tolerate sitting for >/= 30 minutes with pain </=2/10.    Baseline  6-7/10, only tolerating 5-10 minutes    Time  6    Period  Weeks    Status  On-going      PT LONG TERM GOAL #3   Title  Pt will improve right trunk rotation (25% limitation) to improve functional mobillity.    Baseline  25% limitation compared to left rotation      PT LONG TERM GOAL #4   Title  Pt will be able to walk 1 mile  with pain </=2/10 in less 14 minutes.    Baseline  pain varies    Period  Weeks    Status  On-going      PT LONG TERM GOAL #5   Title  Pt will be able to lift 25# from floor to counter height with no pain.    Baseline  pain with bending    Time  6    Period  Weeks    Status  On-going            Plan - 02/19/20 7741    Clinical Impression Statement  Pt arriving reporting no pain at rest. Only traction performed and LE stretching during todays session. We progressed max traction to 155 pounds and min traction to 120 pounds. Pt reporting relief at end of session.  Continue with skilled PT to progress as pt tolerates.    Examination-Activity Limitations  Squat;Stand;Lift    Examination-Participation Restrictions  Contractor    Stability/Clinical Decision Making  Stable/Uncomplicated    Rehab Potential  Good    PT Frequency  2x / week    PT Duration  6 weeks    PT Treatment/Interventions  Electrical Stimulation;Iontophoresis 4mg /ml Dexamethasone;Moist Heat;Traction;Ultrasound;Cryotherapy;Stair training;Functional mobility training;Therapeutic activities;Therapeutic exercise;Balance training;Neuromuscular re-education;Patient/family education;Manual techniques;Passive range of motion;Dry needling;Taping    PT Next Visit Plan  Continue  education and core strengthening.  Constantly update and correct HEP, assess response to DN    PT Home Exercise Plan  Access Code: [email or web address]: [email or web address]: 05/03/2021Prepared by: 04/04/2020 LovellExercisesProne Alternating Arm and Leg Lifts - 1 x daily - 7 x weekly - 2 sets - 10 reps - 5-8 seconds holdProne Bilateral Arm and Leg Lift - 1 x daily - 7 x weekly - 1-2 sets - 10 reps - 3-8 hold    Consulted and Agree with Plan of Care  Patient       Patient will benefit from skilled therapeutic intervention in order to improve the following deficits and impairments:  Pain, Impaired flexibility, Decreased strength, Decreased activity tolerance, Decreased range of motion, Difficulty walking  Visit Diagnosis: Chronic left-sided low back pain with left-sided sciatica  Muscle weakness (generalized)  Difficulty in walking, not elsewhere classified  Posture abnormality     Problem List Patient Active Problem List   Diagnosis Date Noted  . Unilateral primary osteoarthritis, left knee 05/17/2018  . Unilateral primary osteoarthritis, right knee 05/17/2018  . Chronic pain of both knees 05/17/2018  . Biceps rupture, proximal, right, initial encounter 05/22/2017  . Chronic bilateral low back pain without sciatica 03/08/2017  . Lateral epicondylitis, right elbow 03/08/2017  . It band syndrome, left 03/08/2017    05/08/2017, PT, MPT 02/19/2020, 8:28 AM  [facility] Physical Therapy [address] [place], [place], [place] Phone: [phone]   Fax:  [phone]  Name: Nathaniel Jones MRN: [name] Date of Birth: 1980/11/19

## 2020-02-24 ENCOUNTER — Encounter: Payer: 59 | Admitting: Physical Therapy

## 2020-02-26 ENCOUNTER — Telehealth: Payer: Self-pay | Admitting: Physical Therapy

## 2020-02-26 ENCOUNTER — Other Ambulatory Visit: Payer: Self-pay

## 2020-02-26 ENCOUNTER — Ambulatory Visit (INDEPENDENT_AMBULATORY_CARE_PROVIDER_SITE_OTHER): Payer: 59 | Admitting: Orthopaedic Surgery

## 2020-02-26 ENCOUNTER — Encounter: Payer: Self-pay | Admitting: Orthopaedic Surgery

## 2020-02-26 ENCOUNTER — Encounter: Payer: 59 | Admitting: Physical Therapy

## 2020-02-26 DIAGNOSIS — M4807 Spinal stenosis, lumbosacral region: Secondary | ICD-10-CM

## 2020-02-26 NOTE — Telephone Encounter (Signed)
Pt no show for PT appointment today. They were contacted and informed of this and he states he did not know about this appointment. They were provided the date and time of their next appointment. They were instructed to call us to let us know if they cannot make their appointment.   Ivery Quale, PT, DPT 02/26/20 8:28 AM

## 2020-02-26 NOTE — Progress Notes (Signed)
Nathaniel Jones is well-known to me.  He is a very active 39 year old gentleman and is coming in today after having several physical therapy courses as it relates to his low back pain and left-sided radicular symptoms.  He also has known bilateral knee pain which is chronic.  He has moderate arthritis in his knees.  He tries to be as active as he can.  He would like to get back to kickboxing as well.  He is someone who is very tall and does weigh 300 pounds so he does have some stress on the strain from his weight.  He is also an Radio producer.  He does report improvement with physical therapy in terms of decreased pain in his back and decreased radicular symptoms.  He has a few more therapy sessions to go.  The MRI is reviewed with him of his lumbar spine.  He does show some lateral recess stenosis and impingement of nerves to the left-sided L4-L5 from a small disc.  There is some central stenosis at this area as well.  The L5-S1 disc space is normal and there is no foraminal stenosis at that level.  Given the fact that he is making progress in therapy we will still have that route and continue the current course of treatment.  If his symptoms worsen in any way he will need to call us because I would then set him up for an appointment with Dr. Alvester Morin for an epidural steroid injection to the left-side at L4-L5.

## 2020-02-28 ENCOUNTER — Other Ambulatory Visit: Payer: Self-pay

## 2020-02-28 ENCOUNTER — Ambulatory Visit: Payer: 59 | Admitting: Cardiovascular Disease

## 2020-02-28 ENCOUNTER — Encounter: Payer: Self-pay | Admitting: Cardiovascular Disease

## 2020-02-28 VITALS — BP 126/77 | HR 69 | Ht 76.0 in | Wt 321.0 lb

## 2020-02-28 DIAGNOSIS — G4719 Other hypersomnia: Secondary | ICD-10-CM | POA: Diagnosis not present

## 2020-02-28 DIAGNOSIS — E6609 Other obesity due to excess calories: Secondary | ICD-10-CM

## 2020-02-28 DIAGNOSIS — M5442 Lumbago with sciatica, left side: Secondary | ICD-10-CM | POA: Diagnosis not present

## 2020-02-28 DIAGNOSIS — E66812 Obesity, class 2: Secondary | ICD-10-CM

## 2020-02-28 DIAGNOSIS — G4733 Obstructive sleep apnea (adult) (pediatric): Secondary | ICD-10-CM

## 2020-02-28 DIAGNOSIS — G8929 Other chronic pain: Secondary | ICD-10-CM

## 2020-02-28 DIAGNOSIS — Z6839 Body mass index (BMI) 39.0-39.9, adult: Secondary | ICD-10-CM

## 2020-02-28 NOTE — Progress Notes (Signed)
Cardiology Office Note    Date:  02/29/2020   ID:  Nathaniel Jones, DOB 1981/01/20, MRN [id number]  PCP:  Asencion Gowda.August Saucer, MD  Cardiologist:  Nicki Guadalajara, MD    28 month F/U cardiology/sleep evaluation; initially referred by  Dr. Lupe Carney for evaluation of chest burning sensation  History of Present Illness:  Nathaniel Jones is a 39 y.o. male who was initiallyreferred through the courtesy of Dr. Lupe Carney for evaluation of chest pain with atypical features as well as sleepiness.  I saw him for initial evaluation on 07/27/2017.   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  Since his playing days when his weight was 255 pounds, he has gained weight to his weight when I initially saw him at 314 pounds. He continues to exercise at least 4 days per week.  He also plays basketball and recently has noticed more shortness of breath.   He notes a burning in his stomach which sometimes occurs with exercise.  He sits down and in 5-10 minutes the sensation abates.  He had undergone a CT scan of his abdomen remotely. He denies any definitive chest tightness or palpitations.  At times has noticed rare episode of dizziness.  Recently, he is felt to be drained of energy.  He had seen Dr. Lupe Carney.  During his evaluation, there was concern of sleepiness, and it was recommended that he undergo a sleep evaluation.  Apparently he never followed up with this.  Becuse of continued symptomatology, he is was referred for evaluation.  I calculated an Epworth Sleepiness Scale score which endorsed at 15 and is consistent with excessive daytime sleepiness as shown below:  Epworth Sleepiness Scale: Situation   Chance of Dozing/Sleeping (0 = never , 1 = slight chance , 2 = moderate chance , 3 = high chance )   sitting and reading 0   watching TV 0   sitting inactive in a public place 2   being a passenger in a motor vehicle for an hour or more 3   lying down in the  afternoon 3   sitting and talking to someone 1   sitting quietly after lunch (no alcohol) 3   while stopped for a few minutes in traffic as the driver 3   Total Score  15   With his episodic exertional symptomatology  I recommended that he undergo an exercise Myoview study to make certain his symptoms were not ischemia mediated.  This was performed on 08/22/2017 and was normal.  Estimated EF was 51%.  There was normal perfusion.  He exercised for 12 minutes, had a estimated workload of 13.7 METs, and did not develop chest pain or ECG changes.  Perfusion imaging was normal.  An echo Doppler study revealed an ejection fraction of 55% without wall motion abnormality.  He had mild aortic sclerosis with trivial AR.  There was mild LA dilation.  There was mild TR.  Normal pulmonary pressures.  Due to concerns for sleep apnea, he underwent a diagnostic polysomnogram on 09/08/2017.  This suggested increased upper airway resistance syndrome (UARS) with an AHI of 2.3 per hour and RDI of 2.3 per hour.  He did not have significant obstructive sleep apnea.  There was minimal or no oxygen desaturation to a nadir of 91%.  There was soft snoring.  I saw him in follow-up in January 2019.  At that time he continued to have significant daytime sleepiness.  However,  some of this was related to his poor sleep duration and he was only averaging approximately 5 hours of sleep per night.  He was typically staying up to between 1 AM and 2 AM AM and getting up between 5:30 or 6 AM.  He and his wife are separated and during the week and at times on weekends he cares for his son.  He continues to admit to fatigue and daytime sleepiness.  When he presented for his sleep study, his Epworth Sleepiness Scale score endorsed at 16.  During that evaluation, I had a long discussion with him concerning adverse consequences regarding inadequate sleep duration and particularly with increased mortality and decreased cognitive function associated  with sleep duration less than 5 hours.  I recommended he sleep at least 7 to ideally 8 hours per night.  If he continues to experience hypersomnolence and daytime sleepiness with good sleep duration I recommended subsequent sleep evaluation with multiple latency sleep test to evaluate for idiopathic hypersomnia or narcolepsy.  I have not seen Nathaniel Jones since his January 2019 evaluation.  Apparently, he was sent to Dr. Frances Furbish and was seen in February 2020 for evaluation.  At that time, he continued to have orthopedic issues with shoulder pain, low back pain, in addition to prediabetes, obesity, and had significant daytime sleepiness.  An Epworth Sleepiness Scale score calculated at 21.  He had previously undergone a head CT which was negative and apparently had a brain MRI to further evaluate potential memory issues to make certain there was no structural cause of his memory complaints.  Apparently several months later on March 13, 2019 he underwent a subsequent sleep study with plans for possible MLS T evaluation.  However sleep apnea was diagnosed on the diagnostic polysomnogram and as result multiple latency sleep testing was not performed.  The patient states he never really was informed of the sleep evaluation but was just told by the technician that he did have his sleep apnea therefore did not need a nap studies.  He subsequently never had any follow-up with Dr. Frances Furbish and CPAP therapy was never pursued.  The patient has continued to experience symptoms of daytime sleepiness.  A new Epworth Sleepiness Scale score was calculated in the office today by me and this endorsed at 17 with a high chance of dozing while sitting and reading, sitting inactive in a public place, lying down to rest in the afternoon when circumstances persist, sitting quietly after lunch without alcohol.  There is a moderate chance of dozing while watching TV and in the car while stopped for a few minutes.  There is a slight chance of  dozing sitting and talking with someone.  Last year he was working long hours.  He has had difficulty with back injuries with herniated disc at L4 and L5.  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  He typically was going to bed between 1 and 2 AM after getting home at 11 PM and will get up around 6 AM.  Often has to care for his son and get up early.  He believes he has been sleepy.  He is not sleeping well.  There is nocturia approximately 1-2 times per night.  Sleep is nonrestorative.  He denies waking up gasping for breath.  He denies recurrent chest tightness or pressure.  He is unaware of palpitations or nocturnal arrhythmias.  He made this appointment for reevaluation.  Past Medical History:  Diagnosis Date  .  Hypertension     Past Surgical History:  Procedure Laterality Date  . KNEE SURGERY Right   . none      Current Medications: Outpatient Medications Prior to Visit  Medication Sig Dispense Refill  . HYDROcodone-acetaminophen (NORCO/VICODIN) 5-325 MG tablet Take 1 tablet by mouth every 6 (six) hours as needed for moderate pain. 30 tablet 0  . nitroGLYCERIN (NITRO-DUR) 0.1 mg/hr patch Apply 1/4 patch to affected area 12 hours daily 30 patch 3  . nabumetone (RELAFEN) 750 MG tablet Take 1 tablet (750 mg total) by mouth 2 (two) times daily as needed. (Patient not taking: Reported on 02/28/2020) 60 tablet 6  . QUEtiapine (SEROQUEL) 25 MG tablet Take 25 mg by mouth daily.     No facility-administered medications prior to visit.     Allergies:   Ibuprofen   Social History   Socioeconomic History  . Marital status: Single    Spouse name: Not on file  . Number of children: 0  . Years of education: Not on file  . Highest education level: Not on file  Occupational History  . Occupation: recreation Runner, broadcasting/film/video: UNEMPLOYED  Tobacco Use  . Smoking status: Never Smoker  . Smokeless tobacco: Never Used  Substance and Sexual Activity  . Alcohol  use: No  . Drug use: No  . Sexual activity: Not on file  Other Topics Concern  . Not on file  Social History Narrative  . Not on file   Social Determinants of Health   Financial Resource Strain:   . Difficulty of Paying Living Expenses:   Food Insecurity:   . Worried About Programme researcher, broadcasting/film/video in the Last Year:   . Barista in the Last Year:   Transportation Needs:   . Freight forwarder (Medical):   Marland Kitchen Lack of Transportation (Non-Medical):   Physical Activity:   . Days of Exercise per Week:   . Minutes of Exercise per Session:   Stress:   . Feeling of Stress :   Social Connections:   . Frequency of Communication with Friends and Family:   . Frequency of Social Gatherings with Friends and Family:   . Attends Religious Services:   . Active Member of Clubs or Organizations:   . Attends Banker Meetings:   Marland Kitchen Marital Status:     Socially he is single.  He was born in [place] and went to [employer] high school..  At 6 feet 4 inch, 255 pounds, he played tight end for [employer].  He has a BS degree.  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  Family History:  The patient's family history includes Diabetes in his brother, maternal grandmother, mother, and unknown relative; High Cholesterol in his maternal grandmother; Hypertension in his father, mother, and unknown relative; Stroke in his maternal grandfather and maternal grandmother.  His mother is age 22 and has diabetes and hypertension.  Father is 60 and has hypertension.  Brother has diabetes.  He has one child who is 33 years old.  There is a family history of stroke.  ROS General: Negative; No fevers, chills, or night sweats;  HEENT: Negative; No changes in vision or hearing, sinus congestion, difficulty swallowing Pulmonary: Negative; No cough, wheezing, shortness of breath,  hemoptysis Cardiovascular: Negative; No chest pain, presyncope, syncope, palpitations GI: Negative; No nausea, vomiting, diarrhea, or abdominal pain  GU: Negative; No dysuria, hematuria, or difficulty voiding Musculoskeletal: Negative; no myalgias, joint pain, or weakness Hematologic/Oncology: Negative; no easy bruising, bleeding Endocrine: Negative; no heat/cold intolerance; no diabetes Neuro: Negative; no changes in balance, headaches Skin: Negative; No rashes or skin lesions Psychiatric: Negative; No behavioral problems, depression Sleep: Positive for sleepiness, daytime fatigue, nonrestorative sleep,  hypersomnolence, bruxism, restless legs, hypnogognic hallucinations, no cataplexy Other comprehensive 14 point system review is negative.   PHYSICAL EXAM:   VS:  BP 126/77   Pulse 69   Ht 6\' 4"  (1.93 m)   Wt (!) 321 lb (145.6 kg)   SpO2 99%   BMI 39.07 kg/m     Repeat blood pressure was 126/74 when taken by me  Wt Readings from Last 3 Encounters:  02/28/20 (!) 321 lb (145.6 kg)  09/18/19 (!) 302 lb (137 kg)  11/29/18 (!) 318 lb (144.2 kg)    General: Alert, oriented, no distress.  Skin: normal turgor, no rashes, warm and dry HEENT: Normocephalic, atraumatic. Pupils equal round and reactive to light; sclera anicteric; extraocular muscles intact;  Nose without nasal septal hypertrophy Mouth/Parynx benign; Mallinpatti scale 3 Neck: No JVD, no carotid bruits; normal carotid upstroke Lungs: clear to ausculatation and percussion; no wheezing or rales Chest wall: No residual  chest wall tenderness  Heart: PMI not displaced, RRR, s1 s2 normal, 1/6 systolic murmur, no diastolic murmur, no rubs, gallops, thrills, or heaves Abdomen: soft, nontender; no hepatosplenomehaly, BS+; abdominal aorta nontender and not dilated by palpation. Back: no CVA tenderness Pulses 2+ Musculoskeletal: Low back discomfort, herniated disc L4-L5 with sciatica symptoms. Extremities: no clubbing cyanosis or  edema, Homan's sign negative  Neurologic: grossly nonfocal; Cranial nerves grossly wnl Psychologic: Normal mood and affect   Studies/Labs Reviewed:   EKG:  EKG is ordered today.  Normal sinus rhythm at 74 bpm.  No ectopy.  Normal intervals.  07/27/2017 ECG (independently read by me): Sinus bradycardia at 56 bpm.  No ectopy.  Normal intervals.  Recent Labs: BMP Latest Ref Rng & Units 09/06/2009  Glucose 70 - 99 mg/dL 90  BUN 6 - 23 mg/dL 8  Creatinine 0.4 - 1.5 mg/dL 0.8  Sodium 409135 - 811145 mEq/L 141  Potassium 3.5 - 5.1 mEq/L 3.9  Chloride 96 - 112 mEq/L 103     No flowsheet data found.  CBC Latest Ref Rng & Units 09/06/2009 09/06/2009  WBC 4.0 - 10.5 K/uL - 5.6  Hemoglobin 13.0 - 17.0 g/dL 91.414.6 78.213.9  Hematocrit 95.639.0 - 52.0 % 43.0 42.0  Platelets 150 - 400 K/uL - 218   Lab Results  Component Value Date   MCV 89.5 09/06/2009   No results found for: TSH No results found for: HGBA1C   BNP No results found for: BNP  ProBNP No results found for: PROBNP   Lipid Panel  No results found for: CHOL, TRIG, HDL, CHOLHDL, VLDL, LDLCALC, LDLDIRECT   RADIOLOGY: No results found.   Additional studies/ records that were reviewed today include:  I reviewed the office records from Dr. Lupe Carneyean Mitchell.  I also reviewed the laboratory [facility], which showed a hemoglobin A1c of 6.0, glucose 87, creatinine 1.04.  BUN 11.   Testosterone 290.2 I reviewed his nuclear stress test, 2-D echo Doppler study, and sleep study from 2018.  [facility]. I obtained a result of the sleep study performed [facility]: At that time Epworth scale 21.  AHI 11.6/h.  RAM are AHI  at 44.4/h versus non-REM AHI at 6.8/h.  Supine AHI 12.1 versus nonsupine AHI at 6.2/h.  Oxygen nadir at 84%.   ASSESSMENT:    1. Excessive daytime sleepiness   2. OSA (obstructive sleep apnea)   3. Chronic bilateral low back pain with left-sided sciatica   4. Class 2  obesity due to excess calories without serious comorbidity with body mass index (BMI) of 39.0 to 39.9 in adult     PLAN:  Mr. Kishawn Pickar is a 39 year old African-American male who experienced symptoms of  atypical chest pain and also stomach burning following exercise. He is athletic appearing, but since his football days his weight has increased from 255 pounds to most recently today weighing at 321 pounds.  When seen several years ago, a nuclear perfusion study demonstrated good exercise tolerance with a workload of 13.7 mets, and he had no ST segment deviation during exercise or exercise induced ectopy, and had entirely normal perfusion.  His echo Doppler study showed normal systolic function with normal diastolic parameters.  At that time, his weight was 314 pounds.  Sleep study demonstrated increased upper airway resistance syndrome but was not definitive for obstructive sleep apnea.  He has significant residual daytime sleepiness.  At the time he was averaging only 5 hours of sleep per night which undoubtedly was contributing to his continued daytime sleepiness.  Over the last several years he has continued to have orthopedic issues with low back discomfort, herniated disc at L4-L5 with intermittent sciatica symptoms, as well as a dislocation, and shoulder discomfort.  I have not seen him since January 2019.  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  I thoroughly reviewed the evaluation of Dr. Rexene Alberts.  Several months thereafter the patient underwent a follow-up sleep study on March 13, 2019.  I thoroughly reviewed this study which was now positive for obstructive sleep apnea with AHI of 11.6.  However during REM sleep, sleep apnea was severe with an AHI of 44.4/h.  He also had significant oxygen desaturation to a nadir of 84%.   Currently there was no follow-up to the sleep study.  The tentatively scheduled MSLT test the next day was canceled due to the patient testing positive for his OSA.  Patient presented to my office today with complaints of continued sleepiness.  I reviewed the sleep study and discussed with him that he did test positive for sleep apnea and I believe would benefit from CPAP initiation.  I discussed with the patient my recommendations and gave him the option that this can be followed up with Dr. Rexene Alberts or we can do here as well in our office if he selected.  He has not had any follow-up with Dr. Rexene Alberts since his initial evaluation and none since the sleep study and he  ppreferred for me to take over if at all possible.  We discussed the current wait list for a potential CPAP titration I will contact the DME company to see if in fact we can initiate AutoPap due to his significant symptomatology to expedite initiation of treatment.  However, I am concerned that it is over 6 months since his March 13, 2019 evaluation and it may be required that he have a another sleep evaluation.  He states [place] [facility] is his insurance and hopefully they will approve initiation of treatment without another  diagnostic study.  If another evaluation is necessary I will schedule this as a split-night protocol and CPAP titration hopefully can be performed at that time.  I again discussed optimal sleep duration ideally 7 to 8 hours per night and reviewed with him data regarding untreated sleep apnea on his cardiovascular health, blood pressure, potential for nocturnal arrhythmias, potential for ischemia associated with nocturnal oxygen desaturation, as well as its effects on glucose, inflammation and GERD.  His recent sleep study confirmed fragmented sleep.  He will benefit from CPAP initiation.  I discussed the importance of weight loss and increased exercise if at all possible.  I will see him in several months for follow-up evaluation  further recommendations will be made at that time.    Medication Adjustments/Labs and Tests Ordered: Current medicines are reviewed at length with the patient today.  Concerns regarding medicines are outlined above.  Medication changes, Labs and Tests ordered today are listed in the Patient Instructions below. Patient Instructions  Medication Instructions:  CONTINUE WITH CURRENT MEDICATIONS. NO CHANGES.  *If you need a refill on your cardiac medications before your next appointment, please call your pharmacy*  Follow-Up: BASED ON CPAP   OTHER INSTRUCTIONS:' WILL SEND INFORMATION TO DME- CHOICE TO DETERMINE IF AUTO PAP IS POSSIBLE.    Signed, Nicki Guadalajara, MD  02/29/2020 4:18 PM    [facility] [address], [address], [place], [place]  [address] Phone: [phone]

## 2020-02-28 NOTE — Patient Instructions (Signed)
Medication Instructions:  CONTINUE WITH CURRENT MEDICATIONS. NO CHANGES.  *If you need a refill on your cardiac medications before your next appointment, please call your pharmacy*  Follow-Up: BASED ON CPAP   OTHER INSTRUCTIONS:' WILL SEND INFORMATION TO DME- CHOICE TO DETERMINE IF AUTO PAP IS POSSIBLE.

## 2020-02-29 ENCOUNTER — Encounter: Payer: Self-pay | Admitting: Cardiovascular Disease

## 2020-03-03 ENCOUNTER — Encounter: Payer: 59 | Admitting: Physical Therapy

## 2020-03-06 ENCOUNTER — Encounter: Payer: 59 | Admitting: Physical Therapy

## 2020-03-09 ENCOUNTER — Encounter: Payer: Self-pay | Admitting: Physical Therapy

## 2020-03-09 ENCOUNTER — Other Ambulatory Visit: Payer: Self-pay

## 2020-03-09 ENCOUNTER — Ambulatory Visit (INDEPENDENT_AMBULATORY_CARE_PROVIDER_SITE_OTHER): Payer: 59 | Admitting: Physical Therapy

## 2020-03-09 ENCOUNTER — Other Ambulatory Visit: Payer: Self-pay | Admitting: Cardiovascular Disease

## 2020-03-09 DIAGNOSIS — M6281 Muscle weakness (generalized): Secondary | ICD-10-CM | POA: Diagnosis not present

## 2020-03-09 DIAGNOSIS — M5442 Lumbago with sciatica, left side: Secondary | ICD-10-CM | POA: Diagnosis not present

## 2020-03-09 DIAGNOSIS — R293 Abnormal posture: Secondary | ICD-10-CM | POA: Diagnosis not present

## 2020-03-09 DIAGNOSIS — R262 Difficulty in walking, not elsewhere classified: Secondary | ICD-10-CM | POA: Diagnosis not present

## 2020-03-09 DIAGNOSIS — G4733 Obstructive sleep apnea (adult) (pediatric): Secondary | ICD-10-CM

## 2020-03-09 DIAGNOSIS — G8929 Other chronic pain: Secondary | ICD-10-CM

## 2020-03-09 NOTE — Therapy (Signed)
[facility] Physical Therapy [address] [place], [place], [address] Phone: [phone]   Fax:  [phone]  Physical Therapy Treatment/Recert  Patient Details  Name: Nathaniel Jones MRN: [id number] Date of Birth: [birth year] Referring Provider (PT): Jean Rosenthal, MD   Encounter Date: 03/09/2020  PT End of Session - 03/09/20 1550    Visit Number  7    Number of Visits  13    Date for PT Re-Evaluation  04/06/20    PT Start Time  6063    PT Stop Time  1608    PT Time Calculation (min)  53 min    Activity Tolerance  Patient tolerated treatment well    Behavior During Therapy  [facility] for tasks assessed/performed       Past Medical History:  Diagnosis Date  . Hypertension     Past Surgical History:  Procedure Laterality Date  . KNEE SURGERY Right   . none      There were no vitals filed for this visit.  Subjective Assessment - 03/09/20 1532    Subjective  Relays not that much back pain today 2/10 but he still gets it especally when sitting on bed. He had MRI on his back and follow up with MD who recommended to continue with PT, see below for MRI results    Pertinent History  HTN, R knee scope about 2016-2017, partial tears in rotator cuffs bilaterally    How long can you sit comfortably?  10-15 minutes    How long can you stand comfortably?  5-10 minutes    How long can you walk comfortably?  pt reports he is able to walk through the pain    Diagnostic tests  Lumbar MRI: Mild lumbar spondylosis. 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 At L4-5 there is resultant mild central stenosis and asymmetric left subarticular recess stenosis with potential impingement of descending left L5 nerve root. Assessment of nerve root detail is suboptimal due to motion artifact. 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    Patient Stated Goals  play with my son    Pain Onset  More than  a month ago                        [facility] Adult PT Treatment/Exercise - 03/09/20 0001      Lumbar Exercises: Stretches   Active Hamstring Stretch Limitations  2 reps x 30 second holds, seated with cues for neutral spine    Standing Extension  10 reps    Standing Extension Limitations  10 sec holds    Piriformis Stretch  Right;Left;2 reps;30 seconds    Piriformis Stretch Limitations  seated      Lumbar Exercises: Aerobic   Recumbent Bike  L3 X 6 min      Lumbar Exercises: Supine   AB Set Limitations  isometric hip/knee flexion holds with alt heel slide above mat, X 10 reps bilat. Then with pball and hips at 90 performing isometic shoulder extension with hip flexion bilat 10 sec holds X 10 reps      Lumbar Exercises: Quadruped   Opposite Arm/Leg Raise  10 reps;5 seconds;Right arm/Left leg;Left arm/Right leg      Traction   Type of Traction  Lumbar    Min (lbs)  120    Max (lbs)  155    Hold Time  60    Rest Time  20  Time  20 min total             PT Education - 03/09/20 1550    Education Details  core stabilization exercises that do not involve spinal flexion    Person(s) Educated  Patient    Methods  Explanation;Demonstration;Verbal cues    Comprehension  Verbalized understanding;Returned demonstration          PT Long Term Goals - 03/09/20 1559      PT LONG TERM GOAL #1   Title  Pt will be independent in his HEP and progression.    Baseline  progressed 03/09/20    Time  6    Period  Weeks    Status  On-going      PT LONG TERM GOAL #2   Title  Pt will be able to tolerate sitting for >/= 30 minutes with pain </=2/10.    Baseline  6-7/10, only tolerating 5-10 minutes    Time  6    Period  Weeks    Status  On-going      PT LONG TERM GOAL #3   Title  Pt will improve right trunk rotation (25% limitation) to improve functional mobillity.    Baseline  25% limitation compared to left rotation    Status  On-going      PT LONG TERM GOAL #4    Title  Pt will be able to walk 1 mile with pain </=2/10 in less 14 minutes.    Baseline  pain varies    Period  Weeks    Status  On-going      PT LONG TERM GOAL #5   Title  Pt will be able to lift 25# from floor to counter height with no pain.    Baseline  pain with bending    Time  6    Period  Weeks    Status  On-going            Plan - 03/09/20 1554    Clinical Impression Statement  Recert today as POC is up. However he has not attended PT as frequently as prescribed as he was waiting for MRI. His MRI results are in, see subjective results or chart for findings but he does have spondylosis, small disc herniation, and potential impingment at Lt L5 nerve root. Session today focused on more core stabilization exercises that do not involve flexion as well well as lumbar stretching into extension and mechanical traction. He still has deficits in lumbar ROM, core strength, and will continue to benefit from PT.    Examination-Activity Limitations  Squat;Stand;Lift    Examination-Participation Restrictions  Contractor    Stability/Clinical Decision Making  Stable/Uncomplicated    Rehab Potential  Good    PT Frequency  2x / week    PT Duration  6 weeks    PT Treatment/Interventions  Electrical Stimulation;Iontophoresis 4mg /ml Dexamethasone;Moist Heat;Traction;Ultrasound;Cryotherapy;Stair training;Functional mobility training;Therapeutic activities;Therapeutic exercise;Balance training;Neuromuscular re-education;Patient/family education;Manual techniques;Passive range of motion;Dry needling;Taping    PT Next Visit Plan  Continue education and core strengthening.  Constantly update and correct HEP, assess response traction. Begin light deadlift with focus on form and body mechanics    PT Home Exercise Plan  Access Code: [email or web address]: [email or web address]: 05/03/2021Prepared by: 04/04/2020 LovellExercisesProne Alternating Arm and Leg Lifts - 1 x daily - 7 x weekly - 2 sets - 10  reps - 5-8 seconds holdProne Bilateral Arm and Leg Lift - 1 x daily - 7 x weekly - 1-2 sets - 10 reps -  3-8 hold. bird dog, isometric hip flexion in supine for core    Consulted and Agree with Plan of Care  Patient       Patient will benefit from skilled therapeutic intervention in order to improve the following deficits and impairments:  Pain, Impaired flexibility, Decreased strength, Decreased activity tolerance, Decreased range of motion, Difficulty walking  Visit Diagnosis: Chronic left-sided low back pain with left-sided sciatica  Muscle weakness (generalized)  Difficulty in walking, not elsewhere classified  Posture abnormality     Problem List Patient Active Problem List   Diagnosis Date Noted  . Unilateral primary osteoarthritis, left knee 05/17/2018  . Unilateral primary osteoarthritis, right knee 05/17/2018  . Chronic pain of both knees 05/17/2018  . Biceps rupture, proximal, right, initial encounter 05/22/2017  . Chronic bilateral low back pain without sciatica 03/08/2017  . Lateral epicondylitis, right elbow 03/08/2017  . It band syndrome, left 03/08/2017    April Manson, PT,DPT 03/09/2020, 4:01 PM  [facility] Physical Therapy [address] [place], [place], [address] Phone: [phone]   Fax:  [phone]  Name: Nathaniel Jones MRN: [id number] Date of Birth: 26-Mar-1981

## 2020-03-13 ENCOUNTER — Other Ambulatory Visit: Payer: Self-pay

## 2020-03-13 ENCOUNTER — Encounter: Payer: Self-pay | Admitting: Rehabilitative and Restorative Service Providers"

## 2020-03-13 ENCOUNTER — Ambulatory Visit (INDEPENDENT_AMBULATORY_CARE_PROVIDER_SITE_OTHER): Payer: 59 | Admitting: Rehabilitative and Restorative Service Providers"

## 2020-03-13 DIAGNOSIS — M5442 Lumbago with sciatica, left side: Secondary | ICD-10-CM | POA: Diagnosis not present

## 2020-03-13 DIAGNOSIS — R293 Abnormal posture: Secondary | ICD-10-CM | POA: Diagnosis not present

## 2020-03-13 DIAGNOSIS — R262 Difficulty in walking, not elsewhere classified: Secondary | ICD-10-CM

## 2020-03-13 DIAGNOSIS — G8929 Other chronic pain: Secondary | ICD-10-CM

## 2020-03-13 DIAGNOSIS — M6281 Muscle weakness (generalized): Secondary | ICD-10-CM

## 2020-03-13 NOTE — Therapy (Signed)
[facility] Physical Therapy [address] [place], [place], [address] Phone: [phone]   Fax:  [phone]  Physical Therapy Treatment  Patient Details  Name: SHADE KALEY MRN: [id number] Date of Birth: Nov 09, 1980 Referring Provider (PT): Jean Rosenthal, MD   Encounter Date: 03/13/2020   PT End of Session - 03/13/20 1603    Visit Number 8    Number of Visits 13    Date for PT Re-Evaluation 04/06/20    PT Start Time 0355    PT Stop Time 1600    PT Time Calculation (min) 46 min    Activity Tolerance Patient tolerated treatment well;No increased pain    Behavior During Therapy WFL for tasks assessed/performed           Past Medical History:  Diagnosis Date  . Hypertension     Past Surgical History:  Procedure Laterality Date  . KNEE SURGERY Right   . none      There were no vitals filed for this visit.   Subjective Assessment - 03/13/20 1600    Subjective Bowe is very happy with his progress with his rehabilitation.    Pertinent History HTN, R knee scope about 2016-2017, partial tears in rotator cuffs bilaterally    How long can you sit comfortably? 40 minutes (was 10-15 minutes)    How long can you stand comfortably? 40 minutes (was 5-10 minutes)    How long can you walk comfortably? As long as he likes    Diagnostic tests Lumbar MRI: Mild lumbar spondylosis. 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 At L4-5 there is resultant mild central stenosis and asymmetric left subarticular recess stenosis with potential impingement of descending left L5 nerve root. Assessment of nerve root detail is suboptimal due to motion artifact. 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    Patient Stated Goals play with my son    Currently in Pain? No/denies    Pain Onset More than a month ago                             [facility] Adult PT Treatment/Exercise  - 03/13/20 0001      Therapeutic Activites    Therapeutic Activities ADL's;Lifting    ADL's Bed mobility    Lifting Golfer's and diagonal lifting      Exercises   Exercises Lumbar;Knee/Hip      Lumbar Exercises: Stretches   Active Hamstring Stretch 4 reps;20 seconds;Left;Right    Single Knee to Chest Stretch 4 reps;20 seconds;Left;Right    Standing Extension 10 reps    Standing Extension Limitations 3 seconds    Piriformis Stretch 4 reps;20 seconds;Left;Right      Lumbar Exercises: Prone   Opposite Arm/Leg Raise Right arm/Left leg;Left arm/Right leg;10 reps   10 seconds 2 sets   Other Prone Lumbar Exercises Prone alternating hip extensions (forehead on forearms) 1 set of 10  10 second hold    Other Prone Lumbar Exercises Prone bilateral arm & leg extensions (Superman) 10X 10 seconds and 5X 20 seconds                  PT Education - 03/13/20 1612    Education Details Log roll, golfer's and diagonal squat lift education.  Reviewed HEP.    Person(s) Educated Patient    Methods Explanation;Verbal cues    Comprehension Verbalized understanding;Returned demonstration;Verbal cues required;Need further instruction  PT Long Term Goals - 03/09/20 1559      PT LONG TERM GOAL #1   Title Pt will be independent in his HEP and progression.    Baseline progressed 03/09/20    Time 6    Period Weeks    Status On-going      PT LONG TERM GOAL #2   Title Pt will be able to tolerate sitting for >/= 30 minutes with pain </=2/10.    Baseline 6-7/10, only tolerating 5-10 minutes    Time 6    Period Weeks    Status On-going      PT LONG TERM GOAL #3   Title Pt will improve right trunk rotation (25% limitation) to improve functional mobillity.    Baseline 25% limitation compared to left rotation    Status On-going      PT LONG TERM GOAL #4   Title Pt will be able to walk 1 mile with pain </=2/10 in less 14 minutes.    Baseline pain varies    Period Weeks     Status On-going      PT LONG TERM GOAL #5   Title Pt will be able to lift 25# from floor to counter height with no pain.    Baseline pain with bending    Time 6    Period Weeks    Status On-going                 Plan - 03/13/20 1603    Clinical Impression Statement Teyon is doing much better than the last time I saw him in May.  He has no peripheral pain and only pain with sitting on a soft surface without support (bed).  He looks good with his HEP and will be offered the opportunity to transfer into independent rehabilitation on his next visit.    Examination-Activity Limitations Squat;Stand;Lift    Examination-Participation Restrictions Contractor    Stability/Clinical Decision Making Stable/Uncomplicated    Rehab Potential Good    PT Frequency 2x / week    PT Duration 6 weeks    PT Treatment/Interventions Electrical Stimulation;Iontophoresis 4mg /ml Dexamethasone;Moist Heat;Traction;Ultrasound;Cryotherapy;Stair training;Functional mobility training;Therapeutic activities;Therapeutic exercise;Balance training;Neuromuscular re-education;Patient/family education;Manual techniques;Passive range of motion;Dry needling;Taping    PT Next Visit Plan Continue education and core strengthening.    PT Home Exercise Plan Access Code: [email or web address]: [email or web address]: 05/03/2021Prepared by: 04/04/2020 LovellExercisesProne Alternating Arm and Leg Lifts - 1 x daily - 7 x weekly - 2 sets - 10 reps - 5-8 seconds holdProne Bilateral Arm and Leg Lift - 1 x daily - 7 x weekly - 1-2 sets - 10 reps - 3-8 hold. bird dog, isometric hip flexion in supine for core    Consulted and Agree with Plan of Care Patient           Patient will benefit from skilled therapeutic intervention in order to improve the following deficits and impairments:  Pain, Impaired flexibility, Decreased strength, Decreased activity tolerance, Decreased range of motion, Difficulty walking  Visit  Diagnosis: Chronic left-sided low back pain with left-sided sciatica  Muscle weakness (generalized)  Difficulty in walking, not elsewhere classified  Posture abnormality     Problem List Patient Active Problem List   Diagnosis Date Noted  . Unilateral primary osteoarthritis, left knee 05/17/2018  . Unilateral primary osteoarthritis, right knee 05/17/2018  . Chronic pain of both knees 05/17/2018  . Biceps rupture, proximal, right, initial encounter 05/22/2017  . Chronic bilateral low back pain without sciatica 03/08/2017  . Lateral  epicondylitis, right elbow 03/08/2017  . It band syndrome, left 03/08/2017    Cherlyn Cushing PT, MPT 03/13/2020, 4:16 PM  [facility] Physical Therapy [address] [place], [place], [address] Phone: [phone]   Fax:  [phone]  Name: MARKEIS ALLMAN MRN: [id number] Date of Birth: April 03, 1981

## 2020-03-17 ENCOUNTER — Other Ambulatory Visit: Payer: Self-pay

## 2020-03-17 ENCOUNTER — Ambulatory Visit (INDEPENDENT_AMBULATORY_CARE_PROVIDER_SITE_OTHER): Payer: 59 | Admitting: Physical Therapy

## 2020-03-17 DIAGNOSIS — R293 Abnormal posture: Secondary | ICD-10-CM | POA: Diagnosis not present

## 2020-03-17 DIAGNOSIS — M5442 Lumbago with sciatica, left side: Secondary | ICD-10-CM | POA: Diagnosis not present

## 2020-03-17 DIAGNOSIS — R262 Difficulty in walking, not elsewhere classified: Secondary | ICD-10-CM | POA: Diagnosis not present

## 2020-03-17 DIAGNOSIS — M6281 Muscle weakness (generalized): Secondary | ICD-10-CM | POA: Diagnosis not present

## 2020-03-17 DIAGNOSIS — G8929 Other chronic pain: Secondary | ICD-10-CM

## 2020-03-17 NOTE — Therapy (Signed)
[facility] Physical Therapy [address] [place], [place], [address] Phone: [phone]   Fax:  [phone]  Physical Therapy Treatment Discharge Summary  Patient Details  Name: Nathaniel Jones MRN: [id number] Date of Birth: August 10, 1981 Referring Provider (Jones): Jean Rosenthal, MD   Encounter Date: 03/17/2020   Jones End of Session - 03/17/20 1520    Visit Number 9    Number of Visits 13    Date for Jones Re-Evaluation 04/06/20    Jones Start Time 1430    Jones Stop Time 1520    Jones Time Calculation (min) 50 min    Activity Tolerance Patient tolerated treatment well;No increased pain    Behavior During Therapy WFL for tasks assessed/performed           Past Medical History:  Diagnosis Date  . Hypertension     Past Surgical History:  Procedure Laterality Date  . KNEE SURGERY Right   . none      There were no vitals filed for this visit.   Subjective Assessment - 03/17/20 1519    Subjective Jones arriving to therapy today reporting no pain. Jones is pleased with progress in therapy and is ready to continue his HEP on his own. Although Jones reporting he wishes he had access to a traction machine.    Pertinent History HTN, R knee scope about 2016-2017, partial tears in rotator cuffs bilaterally    Diagnostic tests Lumbar MRI: Mild lumbar spondylosis. 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 At L4-5 there is resultant mild central stenosis and asymmetric left subarticular recess stenosis with potential impingement of descending left L5 nerve root. Assessment of nerve root detail is suboptimal due to motion artifact. 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    Patient Stated Goals play with my son    Currently in Pain? No/denies    Pain Onset More than a month ago                                          Jones Long Term Goals - 03/17/20 1440      Jones  LONG TERM GOAL #1   Title Jones will be independent in his HEP and progression.    Baseline progressed 03/09/20    Status Achieved      Jones LONG TERM GOAL #2   Title Jones will be able to tolerate sitting for >/= 30 minutes with pain </=2/10.    Baseline Jones able to tolerate sititng in chair for 30 minutes with pain </=2/10.    Status Achieved      Jones LONG TERM GOAL #3   Title Jones will improve right trunk rotation (25% limitation) to improve functional mobillity.    Baseline Jones with normal rotation no limitations noted today    Status Achieved      Jones LONG TERM GOAL #4   Title Jones will be able to walk 1 mile with pain </=2/10 in less 14 minutes.    Baseline speed varies    Status Achieved      Jones LONG TERM GOAL #5   Title Jones will be able to lift 25# from floor to counter height with no pain.    Baseline no pain reported, lifting both golfter's lift and squat    Period Weeks    Status Achieved  Plan - 03/17/20 1514    Clinical Impression Statement Jones arriving to therpay reporting no pain at rest. Jones reporting improvement since beginning therapy. Jones's HEP has been reviewed and updated. Jones able to demonstrate correct lifting techniques. Jones has met all of his goals set at initial evaluation. We discussed holding high impact activites such as kick boxing for the next few months until further strengthening is done with Jones's home program progression. Jones tolerated traction well. Jones to be discharged from Jones services. .    Examination-Activity Limitations Squat;Stand;Lift    Examination-Participation Restrictions Tour manager    Stability/Clinical Decision Making Stable/Uncomplicated    Rehab Potential Good    Jones Frequency 2x / week    Jones Duration 6 weeks    Jones Treatment/Interventions Electrical Stimulation;Iontophoresis 52m/ml Dexamethasone;Moist Heat;Traction;Ultrasound;Cryotherapy;Stair training;Functional mobility training;Therapeutic activities;Therapeutic exercise;Balance  training;Neuromuscular re-education;Patient/family education;Manual techniques;Passive range of motion;Dry needling;Taping    Jones Next Visit Plan discharge from Jones    Jones Home Exercise Plan Access Code: [email or web address]: [email or web address]: 05/03/2021Prepared by: RHerbie BaltimoreLovellExercisesProne Alternating Arm and Leg Lifts - 1 x daily - 7 x weekly - 2 sets - 10 reps - 5-8 seconds holdProne Bilateral Arm and Leg Lift - 1 x daily - 7 x weekly - 1-2 sets - 10 reps - 3-8 hold. bird dog, isometric hip flexion in supine for core    Consulted and Agree with Plan of Care Patient           Patient will benefit from skilled therapeutic intervention in order to improve the following deficits and impairments:  Pain, Impaired flexibility, Decreased strength, Decreased activity tolerance, Decreased range of motion, Difficulty walking  Visit Diagnosis: Chronic left-sided low back pain with left-sided sciatica  Muscle weakness (generalized)  Difficulty in walking, not elsewhere classified  Posture abnormality    PHYSICAL THERAPY DISCHARGE SUMMARY  Visits from Start of Care: 9   Current functional level related to goals / functional outcomes: See above   Remaining deficits: See above   Education / Equipment: HEP and progression Plan: Patient agrees to discharge.  Patient goals were met. Patient is being discharged due to meeting the stated rehab goals.  ?????       Problem List Patient Active Problem List   Diagnosis Date Noted  . Unilateral primary osteoarthritis, left knee 05/17/2018  . Unilateral primary osteoarthritis, right knee 05/17/2018  . Chronic pain of both knees 05/17/2018  . Biceps rupture, proximal, right, initial encounter 05/22/2017  . Chronic bilateral low back pain without sciatica 03/08/2017  . Lateral epicondylitis, right elbow 03/08/2017  . It band syndrome, left 03/08/2017    Nathaniel Jones, MPT 03/17/2020, 3:23 PM  [facility] [address] [address]Phone: [phone]  Fax:  [phone] Name: Nathaniel STRICKLINGMRN: [id number] of Birth: 1December 07, 1982

## 2020-04-20 ENCOUNTER — Encounter: Payer: Self-pay | Admitting: Physician Assistant

## 2020-04-20 ENCOUNTER — Ambulatory Visit: Payer: 59 | Admitting: Physician Assistant

## 2020-04-20 ENCOUNTER — Other Ambulatory Visit: Payer: Self-pay

## 2020-04-20 DIAGNOSIS — M25561 Pain in right knee: Secondary | ICD-10-CM

## 2020-04-20 NOTE — Progress Notes (Signed)
Office Visit Note   Patient: Nathaniel Jones           Date of Birth: 05/21/1981           MRN: [id number] Visit Date: 04/20/2020              Requested by: [name], [name], MD [address] [place],  [place] [address] PCP: [name], [name], MD   Assessment & Plan: Visit Diagnoses:  1. Acute pain of right knee     Plan: Due to patient's recurrent effusion of the knee painful popping and giving way sensation in the knee recommend MRI to rule out internal derangement.  Having follow-up after the MRI to go over results.  He is given a hinged knee brace.  He will follow-up after the MRI to go over results and discuss further treatment.  Follow-Up Instructions: Return After MRI.   Orders:  No orders of the defined types were placed in this encounter.  No orders of the defined types were placed in this encounter.     Procedures: No procedures performed   Clinical Data: No additional findings.   Subjective: Chief Complaint  Patient presents with  . Right Knee - Injury    HPI Nathaniel Jones is well-known to Dr. Magnus Jones service comes in today due to right knee pain.  He reports on 04/09/2020 he was at the basketball court where his son was participating in basketball again.  Nathaniel Jones reports that he slipped on a wet spot on the floor and hyperextended his right knee.  He states he had an audible pop in the knee.  He has had pain and swelling in the knee since then.  He was seen at [facility] after-hours clinic on 04/09/2020 knee was aspirated and injected.  65 cc of bloody tinged fluid was obtained from the knee.  He states he is having difficulty bending the knee and cannot bring the knee out straight at this point time.  Most of the pain is laterally.  He notes the knee does give way on him and he has painful popping in the knee also.  He brings with him radiographs on a CD these are reviewed and showed advanced tricompartmental arthritis of the right knee for patient's age but  no bone-on-bone arthritis.  No acute fractures.  Review of Systems See HPI otherwise negative or noncontributory.  Objective: Vital Signs: There were no vitals taken for this visit.  Physical Exam Constitutional:      Appearance: He is not ill-appearing or diaphoretic.  Pulmonary:     Effort: Pulmonary effort is normal.  Neurological:     Mental Status: He is alert and oriented to person, place, and time.  Psychiatric:        Mood and Affect: Mood normal.     Ortho Exam Right knee tenderness along medial lateral joint line no instability valgus varus stressing.  Anterior drawer is negative.  McMurray's negative but.  Positive effusion right knee.  No abnormal warmth or erythema. Specialty Comments:  No specialty comments available.  Imaging: No results found.   PMFS History: Patient Active Problem List   Diagnosis Date Noted  . Unilateral primary osteoarthritis, left knee 05/17/2018  . Unilateral primary osteoarthritis, right knee 05/17/2018  . Chronic pain of both knees 05/17/2018  . Biceps rupture, proximal, right, initial encounter 05/22/2017  . Chronic bilateral low back pain without sciatica 03/08/2017  . Lateral epicondylitis, right elbow 03/08/2017  . It band syndrome, left 03/08/2017  Past Medical History:  Diagnosis Date  . Hypertension     Family History  Problem Relation Age of Onset  . Hypertension Unknown   . Diabetes Unknown   . Hypertension Mother   . Diabetes Mother   . Hypertension Father   . Diabetes Brother   . Stroke Maternal Grandmother   . Diabetes Maternal Grandmother   . High Cholesterol Maternal Grandmother   . Stroke Maternal Grandfather     Past Surgical History:  Procedure Laterality Date  . KNEE SURGERY Right   . none     Social History   Occupational History  . Occupation: recreation Runner, broadcasting/film/video: UNEMPLOYED  Tobacco Use  . Smoking status: Never Smoker  . Smokeless tobacco: Never Used  Substance and Sexual  Activity  . Alcohol use: No  . Drug use: No  . Sexual activity: Not on file

## 2020-04-21 ENCOUNTER — Telehealth: Payer: Self-pay | Admitting: Orthopaedic Surgery

## 2020-04-21 ENCOUNTER — Other Ambulatory Visit: Payer: Self-pay | Admitting: Radiology

## 2020-04-21 DIAGNOSIS — M25561 Pain in right knee: Secondary | ICD-10-CM

## 2020-04-21 NOTE — Telephone Encounter (Signed)
Patient called asking for Dr. Magnus Ivan to send referral to different MRI facility for sooner appt. [facility] stated to the patient appt for not until mid Aug. Please call patient about this matter at [phone].

## 2020-04-21 NOTE — Telephone Encounter (Signed)
Do you know of a facility that could get him in sooner for MRI?

## 2020-04-22 NOTE — Telephone Encounter (Signed)
Pt called stating he would like to go elsewhere to have his MRI since he can not get in before aug 11 at [facility] imaging,  I gave pt and option of either [facility] or [facility] and he stated he was closer to [facility] and would like to go there. I teams chatted Eber Jones with [facility] and she will contact pt to schedule appt

## 2020-04-28 ENCOUNTER — Telehealth: Payer: Self-pay | Admitting: Cardiovascular Disease

## 2020-04-28 NOTE — Telephone Encounter (Signed)
Returned the call to the patient. He was calling to check up on his CPAP machine and to find out if he was going to need another sleep study as discussed at his 5/28 office visit.

## 2020-04-28 NOTE — Telephone Encounter (Signed)
Patient is calling to f/u on the CPAP Machine and Mask that Dr. Tresa Endo told him he needed at his last visit, 5/28. Patient states he has not heard anything about it. Please advise.

## 2020-04-29 NOTE — Telephone Encounter (Signed)
Has insurance determined another sleep study is required prior to re-institution of CPAP?  If he needs a study, the order for a split night study was placed on 6/7  but has he been scheduled yet?  If not, please schedule.

## 2020-04-30 ENCOUNTER — Telehealth: Payer: Self-pay | Admitting: Physician Assistant

## 2020-04-30 NOTE — Telephone Encounter (Signed)
Patient wanted to know if he could be seen sooner.

## 2020-05-02 ENCOUNTER — Ambulatory Visit (HOSPITAL_BASED_OUTPATIENT_CLINIC_OR_DEPARTMENT_OTHER)
Admission: RE | Admit: 2020-05-02 | Discharge: 2020-05-02 | Disposition: A | Discharge status: Home / Self Care | Payer: 59 | Source: Ambulatory Visit | Attending: Physician Assistant | Admitting: Physician Assistant

## 2020-05-02 ENCOUNTER — Other Ambulatory Visit: Payer: Self-pay

## 2020-05-02 DIAGNOSIS — M25561 Pain in right knee: Secondary | ICD-10-CM

## 2020-05-04 ENCOUNTER — Other Ambulatory Visit: Payer: Self-pay

## 2020-05-04 DIAGNOSIS — G4733 Obstructive sleep apnea (adult) (pediatric): Secondary | ICD-10-CM

## 2020-05-11 ENCOUNTER — Telehealth: Payer: Self-pay

## 2020-05-11 ENCOUNTER — Ambulatory Visit: Payer: 59 | Admitting: Physician Assistant

## 2020-05-11 NOTE — Telephone Encounter (Signed)
°  Patient was to see Bronson Curb this afternoon to review MRI scan. Would like to be called with the results if possible since there are no available appointments until next week.  Best contact for patient is [phone]

## 2020-05-13 ENCOUNTER — Telehealth: Payer: Self-pay | Admitting: *Deleted

## 2020-05-13 ENCOUNTER — Other Ambulatory Visit: Payer: 59

## 2020-05-13 NOTE — Telephone Encounter (Signed)
Original request never received. Previous sleep study sleep study greater than one year old. New sleep study required. PA submitted to [facility].

## 2020-05-13 NOTE — Telephone Encounter (Signed)
PA submitted to [employer] for in lab sleep study. 

## 2020-05-15 NOTE — Telephone Encounter (Signed)
Nash Dimmer with[employer] is calling to follow up in regards to prior authorization.

## 2020-05-15 NOTE — Telephone Encounter (Signed)
Routed to Wanda CMA 

## 2020-05-20 ENCOUNTER — Other Ambulatory Visit: Payer: Self-pay | Admitting: Cardiovascular Disease

## 2020-05-21 ENCOUNTER — Other Ambulatory Visit: Payer: Self-pay | Admitting: Cardiovascular Disease

## 2020-05-21 ENCOUNTER — Telehealth: Payer: Self-pay | Admitting: *Deleted

## 2020-05-21 NOTE — Telephone Encounter (Signed)
Patient notified in lab sleep study denied by [facility] . HST ordered. Appointment details were given to patient.

## 2020-05-22 ENCOUNTER — Telehealth: Payer: Self-pay | Admitting: Orthopaedic Surgery

## 2020-05-22 NOTE — Telephone Encounter (Signed)
I called pt and advised that he had not been seen here in the office for this and that I am not sure if CB would see him in the office for gout or defer to PCP advised would hold till Monday but to continue with steroid from [facility] and will address on Monday.

## 2020-05-22 NOTE — Telephone Encounter (Signed)
Pt states he went to an urgent care on 05/11/20 and they diagnosed him with having gout in his toe and gave him some steroids and told him it should knock it out; however he is still having some pain and would like to know if there's anything we can prescribe?  [phone]

## 2020-05-25 NOTE — Telephone Encounter (Signed)
Yes would need to see him

## 2020-05-25 NOTE — Telephone Encounter (Signed)
Yes, he needs appt with Gil/Blackman

## 2020-05-28 ENCOUNTER — Ambulatory Visit: Payer: 59 | Admitting: Physician Assistant

## 2020-05-28 ENCOUNTER — Encounter: Payer: Self-pay | Admitting: Physician Assistant

## 2020-05-28 DIAGNOSIS — M10072 Idiopathic gout, left ankle and foot: Secondary | ICD-10-CM | POA: Diagnosis not present

## 2020-05-28 MED ORDER — COLCHICINE 0.6 MG PO CAPS: 0.6000 mg | ORAL_CAPSULE | Freq: Every day | ORAL | 0 refills | Status: DC

## 2020-05-28 NOTE — Progress Notes (Signed)
HPI: Is diagnosed with gout.  He has never had a gout attack before but does state that his mother has gout.  He had eaten a steak the day before which is something he usually does not do.  They placed him on a Dosepak of prednisone which was somewhat helpful but he still has pain in the left great toe.  He states that the toe was significantly more swollen prior to the Medrol Dosepak.  He states he is just began wearing new shoes 3 days ago due to the pain in the toe.   Physical exam: General: Well-developed well-nourished male no acute distress mood and affect appropriate. Left foot he has tenderness along the MP joint medial aspect with palpation there is slight warmth and slight erythema over the area.  Good range of motion to the IP joint and MP joint without significant pain.  Remainder left foot is without rashes skin lesions ulcerations.  Remainder the foot is nontender.  Impression: Left great toe gout  Plan: Place him on colchicine 0.6 mg 1 tablet every hour for total of 3 doses and then once daily thereafter until symptoms resolved.  He is to see his primary care physician to discuss possible preventative medicines.  Questions were encouraged and answered.  Follow-up with [place] as needed.

## 2020-07-14 ENCOUNTER — Other Ambulatory Visit: Payer: Self-pay

## 2020-07-14 ENCOUNTER — Ambulatory Visit (HOSPITAL_BASED_OUTPATIENT_CLINIC_OR_DEPARTMENT_OTHER): Payer: 59 | Attending: Cardiovascular Disease | Admitting: Cardiovascular Disease

## 2020-07-14 DIAGNOSIS — G4719 Other hypersomnia: Secondary | ICD-10-CM

## 2020-07-14 DIAGNOSIS — Z79899 Other long term (current) drug therapy: Secondary | ICD-10-CM | POA: Diagnosis not present

## 2020-07-14 DIAGNOSIS — G4733 Obstructive sleep apnea (adult) (pediatric): Secondary | ICD-10-CM | POA: Diagnosis not present

## 2020-07-14 DIAGNOSIS — R0683 Snoring: Secondary | ICD-10-CM

## 2020-07-21 ENCOUNTER — Telehealth: Payer: Self-pay | Admitting: Cardiovascular Disease

## 2020-07-21 NOTE — Telephone Encounter (Signed)
Patient calling for sleep study results. °

## 2020-07-21 NOTE — Telephone Encounter (Signed)
Reached out to patient to inform him that his sleep study has not been read by dr Tresa Endo and as soon as he reads it someone will be calling him with his results.  left message on vm to call back.

## 2020-07-21 NOTE — Telephone Encounter (Signed)
Spoke to Manly, routing to her.

## 2020-08-04 ENCOUNTER — Telehealth: Payer: Self-pay | Admitting: *Deleted

## 2020-08-04 ENCOUNTER — Encounter (HOSPITAL_BASED_OUTPATIENT_CLINIC_OR_DEPARTMENT_OTHER): Payer: Self-pay | Admitting: Cardiovascular Disease

## 2020-08-04 DIAGNOSIS — G4733 Obstructive sleep apnea (adult) (pediatric): Secondary | ICD-10-CM

## 2020-08-04 NOTE — Addendum Note (Signed)
Addended by: Reesa Chew on: 08/04/2020 03:53 PM   Modules accepted: Orders

## 2020-08-04 NOTE — Procedures (Signed)
     Patient Name: Nathaniel Jones, Nathaniel Jones Date: 07/14/2020 Gender: Male D.O.B: 02-01-1981 Age (years): 39 Referring Provider: Nicki Guadalajara MD, ABSM Height (inches): 76 Interpreting Physician: Nicki Guadalajara MD, ABSM Weight (lbs): 321 RPSGT: [place] BMI: 39 MRN: [id number] Neck Size: 19.50  CLINICAL INFORMATION Sleep Study Type: HST  Indication for sleep study: excessive daytime sleepiness, snoring, frequent awakenings  Epworth Sleepiness Score: 20  Initial sleep evauation: 09/08/2017: AHI 2.3/h: O2 nadir 91%.  Subsequent evaluation (Dr. Frances Furbish):  03/13/2019:  AHI 11.6/h; REM AHI 44.4/h; O2 nadir 84%. Never started on therapy.  SLEEP STUDY TECHNIQUE A multi-channel overnight portable sleep study was performed. The channels recorded were: nasal airflow, thoracic respiratory movement, and oxygen saturation with a pulse oximetry. Snoring was also monitored.  MEDICATIONS Colchicine (MITIGARE) 0.6 MG CAPS doxycycline (MONODOX) 100 MG capsule HYDROcodone-acetaminophen (NORCO/VICODIN) 5-325 MG tablet methylPREDNISolone (MEDROL DOSEPAK) 4 MG TBPK tablet nabumetone (RELAFEN) 750 MG tablet naproxen (NAPROSYN) 500 MG tablet QUEtiapine (SEROQUEL) 25 MG tablet sildenafil (VIAGRA) 100 MG tablet  Patient self administered medications include: TRAZODONE.  SLEEP ARCHITECTURE Patient was studied for 443 minutes. The sleep efficiency was 100.0 % and the patient was supine for 98.1%. The arousal index was 0.0 per hour.  RESPIRATORY PARAMETERS The overall AHI was 10.0 per hour, with a central apnea index of 0.0 per hour. The severity of sleep apnea during REM sleep cannot be assessed on this home study.  The oxygen nadir was 84% during sleep.  CARDIAC DATA Mean heart rate during sleep was 66.7 bpm.  IMPRESSIONS - Mild obstructive sleep apnea overall (AHI 10.0/h);  however, the patient had previously documented severe OSA during REM sleep on a prior study which cannot be assessed  on this home study.  - No significant central sleep apnea occurred during this study (CAI = 0.0/h). - Moderate oxygen desaturation to a nadir of 84%. - Patient snored 3.8% during the sleep.  DIAGNOSIS - Obstructive Sleep Apnea (G47.33)  RECOMMENDATIONS - In this symptomatic patient with significant daytime sleepiness (ESS 20) recommend therapeutic CPAP titration to determine optimal pressure required to alleviate sleep disordered breathing. Consider Auto-PAP initiation with EPR at 6-20 cm of water to expedite initiation of therapy if unable to have expeditious in lab-CPAP titration. - Effort should be made to optimize nasal and oropharyngeal patency. - Avoid alcohol, sedatives and other CNS depressants that may worsen sleep apnea and disrupt normal sleep architecture. - Sleep hygiene should be reviewed to assess factors that may improve sleep quality. - Weight management and regular exercise should be initiated or continued. - Return to Sleep Center to discuss the results of this study   [Electronically signed] 08/04/2020 10:20 AM  Nicki Guadalajara MD, [facility], ABSM Diplomate, American Board of Sleep Medicine   NPI: [id number] [facility] PH: [phone]   FX: [phone] ACCREDITED BY THE AMERICAN ACADEMY OF SLEEP MEDICINE

## 2020-08-04 NOTE — Telephone Encounter (Addendum)
Informed patient of sleep study results and patient understanding was verbalized. Patient understands her sleep study showed: IMPRESSIONS - Mild obstructive sleep apnea overall (AHI 10.0/h);  however, the patient had previously documented severe OSA during REM sleep on a prior study which cannot be assessed on this home study.                                     Moderate oxygen desaturation to a nadir of 84%. - Patient snored 3.8% during the sleep.  DIAGNOSIS - Obstructive Sleep Apnea (G47.33)  RECOMMENDATIONS - In this symptomatic patient with significant daytime sleepiness (ESS 20) recommend therapeutic CPAP titration to determine optimal pressure required to alleviate sleep disordered breathing. Consider Auto-PAP initiation with EPR at 6-20 cm of water to expedite initiation of therapy if unable to have expeditious in lab-CPAP titration.  Titration sent to sleep pool.

## 2020-08-04 NOTE — Telephone Encounter (Signed)
-----   Message from [name], MD sent at 08/04/2020 10:30 AM EDT ----- Coralee [place], please notify pt and set up for Auto-PAP unless able to get approved an expeditious CPAP titration study

## 2020-08-11 ENCOUNTER — Telehealth: Payer: Self-pay | Admitting: Cardiovascular Disease

## 2020-08-11 NOTE — Telephone Encounter (Signed)
Cpap titration study is pending review with [facility] - Tracking [phone]

## 2020-08-12 ENCOUNTER — Telehealth: Payer: Self-pay | Admitting: *Deleted

## 2020-08-12 DIAGNOSIS — G4733 Obstructive sleep apnea (adult) (pediatric): Secondary | ICD-10-CM

## 2020-08-12 NOTE — Telephone Encounter (Signed)
Nathaniel Jones, CMA [employer] has denied Cpap titration

## 2020-08-12 NOTE — Telephone Encounter (Signed)
-----   Message from Pollyann Kennedy sent at 08/11/2020  9:44 AM EST ----- Regarding: RE: precert Cpap titration study is pending review with [facility] - Tracking #[id number] ----- Message ----- From: Reesa Chew, CMA Sent: 08/04/2020   2:25 PM EST To: [facility] Div Sleep Studies Subject: precert                                        CPAP titration

## 2020-08-12 NOTE — Addendum Note (Signed)
Addended by: Reesa Chew on: 08/12/2020 05:56 PM   Modules accepted: Orders

## 2020-08-12 NOTE — Telephone Encounter (Signed)
Consider Auto-PAP initiation with EPR at 6-20 cm of water . Will order APAP.  Informed patient of sleep study results and patient understanding was verbalized. Patient understands his sleep study showed   IMPRESSIONS - Mild obstructive sleep apnea overall (AHI 10.0/h);  however, the patient had previously documented severe OSA during REM sleep on a prior study which cannot be assessed on this home study.  - Moderate oxygen desaturation to a nadir of 84%. - Patient snored 3.8% during the sleep.  DIAGNOSIS - Obstructive Sleep Apnea (G47.33)  RECOMMENDATIONS - In this symptomatic patient with significant daytime sleepiness (ESS 20) recommend therapeutic CPAP titration to determine optimal pressure required to alleviate sleep disordered breathing. Consider Auto-PAP initiation with EPR at 6-20 cm of water to expedite initiation of therapy if unable to have expeditious in lab-CPAP titration.   Upon patient request DME selection is [facility]. Patient understands she/he will be contacted by [facility] to set up her/he cpap. Patient understands to call if [facility] does not contact her/he with new setup in a timely manner. Patient understands they will be called once confirmation has been received from [facility] that they have received their new machine to schedule 10 week follow up appointment.   [facility] notified of new cpap order  Please add to airview Patient was grateful for the call and thanked me.

## 2020-08-12 NOTE — Telephone Encounter (Signed)
[employer] denied cpap titration

## 2020-08-12 NOTE — Telephone Encounter (Signed)
Per dr Tresa Endo RECOMMENDATIONS  Consider Auto-PAP initiation with EPR at 6-20 cm of water to expedite initiation of therapy if unable to have expeditious in lab-CPAP titration.

## 2020-08-12 NOTE — Addendum Note (Signed)
Addended by: Reesa Chew on: 08/12/2020 06:00 PM   Modules accepted: Orders

## 2020-08-13 NOTE — Telephone Encounter (Signed)
Informed patient of sleep study results and patient understanding was verbalized.  

## 2020-08-21 NOTE — Telephone Encounter (Signed)
Buddy Duty, CMA The patients split night was denied.         ----- Message -----  From: Reesa Chew, CMA  Sent: 08/04/2020  2:25 PM EST  To: [facility] Div Sleep Studies  Subject: precert                      CPAP titration

## 2020-09-10 ENCOUNTER — Ambulatory Visit (INDEPENDENT_AMBULATORY_CARE_PROVIDER_SITE_OTHER): Payer: 59 | Admitting: Physician Assistant

## 2020-09-10 ENCOUNTER — Ambulatory Visit: Payer: Self-pay

## 2020-09-10 ENCOUNTER — Encounter: Payer: Self-pay | Admitting: Physician Assistant

## 2020-09-10 DIAGNOSIS — M5442 Lumbago with sciatica, left side: Secondary | ICD-10-CM

## 2020-09-10 DIAGNOSIS — M542 Cervicalgia: Secondary | ICD-10-CM

## 2020-09-10 MED ORDER — NABUMETONE 750 MG PO TABS
750.0000 mg | ORAL_TABLET | Freq: Two times a day (BID) | ORAL | 6 refills | Status: DC | PRN
Start: 2020-09-10 — End: 2021-07-30

## 2020-09-10 MED ORDER — CYCLOBENZAPRINE HCL 5 MG PO TABS
5.0000 mg | ORAL_TABLET | Freq: Three times a day (TID) | ORAL | 1 refills | Status: DC | PRN
Start: 2020-09-10 — End: 2022-08-22

## 2020-09-10 NOTE — Progress Notes (Signed)
Office Visit Note   Patient: Nathaniel Jones           Date of Birth: 05/26/1981           MRN: [id number] Visit Date: 09/10/2020              Requested by: [name], [name], MD [address] [place],  [place] [address] PCP: [name], [name], MD   Assessment & Plan: Visit Diagnoses:  1. Low back pain with left-sided sciatica, unspecified back pain laterality, unspecified chronicity   2. Neck pain     Plan: We will send him for an epidural steroid injection with Dr. Alvester Morin lumbar left L5 transforaminal.  In regards to the numbness tingling in the hand his description of the numbness tingling in the hand fits more of a carpal tunnel with his clinical exam fits possible cervical impingement.  We will send him for EMG nerve conduction studies to rule out carpal tunnel versus cervical radiculopathy.  Have him follow-up after these studies and treatments for reevaluation.  Questions were encouraged and answered.  Refill on his epitenon and Flexeril.  Follow-Up Instructions: No follow-ups on file.   Orders:  Orders Placed This Encounter  Procedures  . XR Cervical Spine 2 or 3 views  . XR Lumbar Spine 2-3 Views   Meds ordered this encounter  Medications  . nabumetone (RELAFEN) 750 MG tablet    Sig: Take 1 tablet (750 mg total) by mouth 2 (two) times daily as needed.    Dispense:  60 tablet    Refill:  6  . cyclobenzaprine (FLEXERIL) 5 MG tablet    Sig: Take 1 tablet (5 mg total) by mouth 3 (three) times daily as needed.    Dispense:  40 tablet    Refill:  1      Procedures: No procedures performed   Clinical Data: No additional findings.   Subjective: Chief Complaint  Patient presents with  . Lower Back - Pain  . Neck - Pain    HPI Nathaniel Jones is 39 year old male comes in today for 2 complaints.  7 bilateral hand numbness and left leg pain and numbness.  Both hands are falling asleep particularly at the tips of his fingers he states all of fingers.  Occluded  numbness been ongoing for some time now.  Is worse whenever he is first awakens in the morning when he is driving or whenever he is on elliptical machine.  He states he has to shake his hands due to the numbness.  He has had some neck discomfort and stiffness.  But no real radicular symptoms down either arm.  He states that his hands due to the numbness tingling does awaken him at times. Regards to his back he has had periodic low back pain.  He is actually underwent an epidural steroid injection with Dr. Alvester Morin in 2017 for a left L5 transforaminal injection at that time he is having similar symptoms to what he is having now.  He denies any bowel or bladder dysfunction no waking pain due to the back and no saddle anesthesia like symptoms.  He underwent an MRI of his lumbar spine May of this year which showed degenerative disc disease at L3-4 and L4-5.  At L4-5 there was mild central's canal stenosis and asymmetric left subarticular recess stenosis with potential impingement on the left L5 nerve root.  He has been taking Flexeril he is ran out of his NSAID that he was taking.  He states  that these have helped in the past.  He is try to formal therapy for his back states this is helped but he is somewhat discouraged by the fact that it did not fix the problem permanently. Review of Systems   Objective: Vital Signs: There were no vitals taken for this visit.  Physical Exam Constitutional:      Appearance: He is not ill-appearing or diaphoretic.  Pulmonary:     Effort: Pulmonary effort is normal.  Neurological:     Mental Status: He is alert and oriented to person, place, and time.  Psychiatric:        Mood and Affect: Mood normal.     Ortho Exam Bilateral hands full sensation except for the left thumb which he has subjective decreased sensation.  Full motor bilateral hands.  Negative Tinel's at the wrist bilaterally.  Negative compression test over the median nerve bilaterally.  Negative Phalen's  test bilaterally.  5 5 strength throughout the upper extremities against resistance.  Good range of motion bilateral shoulders without pain.  He has 5 out of 5 strength with external and internal rotation against resistance bilateral shoulders.  Nontender over the medial borders of the scapulas bilaterally.  He has pain with flexion extension of the cervical spine and a positive Spurling's. Lower extremities 5 out of 5 strength throughout against resistance except for the left great toe with extension against resistance 4 out of 5.  He has decreased sensation of his left great toe to light touch.  Positive straight leg raise on the left and weak. Specialty Comments:  No specialty comments available.  Imaging: No results found.   PMFS History: Patient Active Problem List   Diagnosis Date Noted  . Suspected COVID-19 virus infection 12/24/2019  . Unilateral primary osteoarthritis, left knee 05/17/2018  . Unilateral primary osteoarthritis, right knee 05/17/2018  . Chronic pain of both knees 05/17/2018  . Biceps rupture, proximal, right, initial encounter 05/22/2017  . Chronic bilateral low back pain without sciatica 03/08/2017  . Lateral epicondylitis, right elbow 03/08/2017  . It band syndrome, left 03/08/2017   Past Medical History:  Diagnosis Date  . Hypertension     Family History  Problem Relation Age of Onset  . Hypertension Unknown   . Diabetes Unknown   . Hypertension Mother   . Diabetes Mother   . Hypertension Father   . Diabetes Brother   . Stroke Maternal Grandmother   . Diabetes Maternal Grandmother   . High Cholesterol Maternal Grandmother   . Stroke Maternal Grandfather     Past Surgical History:  Procedure Laterality Date  . KNEE SURGERY Right   . none     Social History   Occupational History  . Occupation: recreation Runner, broadcasting/film/video: UNEMPLOYED  Tobacco Use  . Smoking status: Never Smoker  . Smokeless tobacco: Never Used  Substance and Sexual Activity   . Alcohol use: No  . Drug use: No  . Sexual activity: Not on file

## 2020-09-11 NOTE — Addendum Note (Signed)
Addended by: Barbette Or on: 09/11/2020 08:17 AM   Modules accepted: Orders

## 2020-10-15 ENCOUNTER — Ambulatory Visit (INDEPENDENT_AMBULATORY_CARE_PROVIDER_SITE_OTHER): Payer: 59 | Admitting: Physical Medicine and Rehabilitation

## 2020-10-15 ENCOUNTER — Other Ambulatory Visit: Payer: Self-pay

## 2020-10-15 ENCOUNTER — Ambulatory Visit: Payer: Self-pay

## 2020-10-15 ENCOUNTER — Ambulatory Visit: Payer: 59 | Admitting: Physical Medicine and Rehabilitation

## 2020-10-15 ENCOUNTER — Encounter: Payer: Self-pay | Admitting: Physical Medicine and Rehabilitation

## 2020-10-15 VITALS — BP 104/67 | HR 62

## 2020-10-15 DIAGNOSIS — G479 Sleep disorder, unspecified: Secondary | ICD-10-CM | POA: Insufficient documentation

## 2020-10-15 DIAGNOSIS — R202 Paresthesia of skin: Secondary | ICD-10-CM

## 2020-10-15 DIAGNOSIS — G44209 Tension-type headache, unspecified, not intractable: Secondary | ICD-10-CM | POA: Insufficient documentation

## 2020-10-15 DIAGNOSIS — M5416 Radiculopathy, lumbar region: Secondary | ICD-10-CM

## 2020-10-15 DIAGNOSIS — R7989 Other specified abnormal findings of blood chemistry: Secondary | ICD-10-CM | POA: Insufficient documentation

## 2020-10-15 DIAGNOSIS — K625 Hemorrhage of anus and rectum: Secondary | ICD-10-CM | POA: Insufficient documentation

## 2020-10-15 DIAGNOSIS — E669 Obesity, unspecified: Secondary | ICD-10-CM | POA: Insufficient documentation

## 2020-10-15 DIAGNOSIS — R7303 Prediabetes: Secondary | ICD-10-CM | POA: Insufficient documentation

## 2020-10-15 DIAGNOSIS — M150 Primary generalized (osteo)arthritis: Secondary | ICD-10-CM | POA: Insufficient documentation

## 2020-10-15 DIAGNOSIS — R200 Anesthesia of skin: Secondary | ICD-10-CM

## 2020-10-15 DIAGNOSIS — M542 Cervicalgia: Secondary | ICD-10-CM | POA: Insufficient documentation

## 2020-10-15 MED ORDER — METHYLPREDNISOLONE ACETATE 80 MG/ML IJ SUSP
80.0000 mg | Freq: Once | INTRAMUSCULAR | Status: AC
  Administered 2020-10-15: 80 mg

## 2020-10-15 NOTE — Progress Notes (Unsigned)
Numbness on outside of left leg. Low back pain worse on left. Better with exercise and PT. Numeric Pain Rating Scale and Functional Assessment Average Pain 7   In the last MONTH (on 0-10 scale) has pain interfered with the following?  1. General activity like being  able to carry out your everyday physical activities such as walking, climbing stairs, carrying groceries, or moving a chair?  Rating(10)   +Driver, -BT, -Dye Allergies.

## 2020-10-15 NOTE — Progress Notes (Signed)
Fingertips both hands go numb. Worse with driving, sleeping.  Right hand dominant lotion Numeric Pain Rating Scale and Functional Assessment Average Pain 4   In the last MONTH (on 0-10 scale) has pain interfered with the following?  1. General activity like being  able to carry out your everyday physical activities such as walking, climbing stairs, carrying groceries, or moving a chair?  Rating(8)

## 2020-10-15 NOTE — Patient Instructions (Signed)

## 2020-10-16 NOTE — Procedures (Signed)
Lumbosacral Transforaminal Epidural Steroid Injection - Sub-Pedicular Approach with Fluoroscopic Guidance  Patient: Nathaniel Jones      Date of Birth: [birth year] MRN: [id number] PCP: [name], [name], MD      Visit Date: 10/15/2020   Universal Protocol:    Date/Time: 10/15/2020  Consent Given By: the patient  Position: PRONE  Additional Comments: Vital signs were monitored before and after the procedure. Patient was prepped and draped in the usual sterile fashion. The correct patient, procedure, and site was verified.   Injection Procedure Details:   Procedure diagnoses: Lumbar radiculopathy [M54.16]    Meds Administered:  Meds ordered this encounter  Medications  . methylPREDNISolone acetate (DEPO-MEDROL) injection 80 mg    Laterality: Left  Location/Site:  L5-S1  Needle:5.0 in., 22 ga.  Short bevel or Quincke spinal needle  Needle Placement: Transforaminal  Findings:    -Comments: Excellent flow of contrast along the nerve, nerve root and into the epidural space.  Procedure Details: After squaring off the end-plates to get a true AP view, the C-arm was positioned so that an oblique view of the foramen as noted above was visualized. The target area is just inferior to the "nose of the scotty dog" or sub pedicular. The soft tissues overlying this structure were infiltrated with 2-3 ml. of 1% Lidocaine without Epinephrine.  The spinal needle was inserted toward the target using a "trajectory" view along the fluoroscope beam.  Under AP and lateral visualization, the needle was advanced so it did not puncture dura and was located close the 6 O'Clock position of the pedical in AP tracterory. Biplanar projections were used to confirm position. Aspiration was confirmed to be negative for CSF and/or blood. A 1-2 ml. volume of Isovue-250 was injected and flow of contrast was noted at each level. Radiographs were obtained for documentation purposes.   After attaining the desired  flow of contrast documented above, a 0.5 to 1.0 ml test dose of 0.25% Marcaine was injected into each respective transforaminal space.  The patient was observed for 90 seconds post injection.  After no sensory deficits were reported, and normal lower extremity motor function was noted,   the above injectate was administered so that equal amounts of the injectate were placed at each foramen (level) into the transforaminal epidural space.   Additional Comments:  The patient tolerated the procedure well Dressing: 2 x 2 sterile gauze and Band-Aid    Post-procedure details: Patient was observed during the procedure. Post-procedure instructions were reviewed.  Patient left the clinic in stable condition.

## 2020-10-16 NOTE — Progress Notes (Signed)
Nathaniel Jones - 40 y.o. male MRN [id number]  Date of birth: 02-09-1981  Office Visit Note: Visit Date: 10/15/2020 PCP: [name], [name], MD Referred by: [name], [name], MD  Subjective: Chief Complaint  Patient presents with  . Lower Back - Pain   HPI:  Nathaniel Jones is a 40 y.o. male who comes in today at the request of Rexene Edison, PA-C for planned Left L5-S1 Lumbar epidural steroid injection with fluoroscopic guidance.  The patient has failed conservative care including home exercise, medications, time and activity modification.  This injection will be diagnostic and hopefully therapeutic.  Please see requesting physician notes for further details and justification.  Patient had prior left L5 transforaminal dural steroid injection in 2018.  MRI at that time did show disc herniation at L4-5 and consistent L5 entrapment.  He has had updated MRI which was reviewed today and reviewed below.  Shows continued issues in that area.  Symptoms are more of an L5 and S1 type distribution on the left.  No right-sided pain.  He is failed conservative care as noted.  ROS Otherwise per HPI.  Assessment & Plan: Visit Diagnoses:    ICD-10-CM   1. Lumbar radiculopathy  M54.16 XR C-ARM NO REPORT    Epidural Steroid injection    methylPREDNISolone acetate (DEPO-MEDROL) injection 80 mg    Plan: No additional findings.   Meds & Orders:  Meds ordered this encounter  Medications  . methylPREDNISolone acetate (DEPO-MEDROL) injection 80 mg    Orders Placed This Encounter  Procedures  . XR C-ARM NO REPORT  . Epidural Steroid injection    Follow-up: Return for visit to requesting physician as needed.   Procedures: No procedures performed  Lumbosacral Transforaminal Epidural Steroid Injection - Sub-Pedicular Approach with Fluoroscopic Guidance  Patient: Nathaniel Jones      Date of Birth: [birth year] MRN: [id number] PCP: [name], [name], MD      Visit Date: 10/15/2020   Universal Protocol:     Date/Time: 10/15/2020  Consent Given By: the patient  Position: PRONE  Additional Comments: Vital signs were monitored before and after the procedure. Patient was prepped and draped in the usual sterile fashion. The correct patient, procedure, and site was verified.   Injection Procedure Details:   Procedure diagnoses: Lumbar radiculopathy [M54.16]    Meds Administered:  Meds ordered this encounter  Medications  . methylPREDNISolone acetate (DEPO-MEDROL) injection 80 mg    Laterality: Left  Location/Site:  L5-S1  Needle:5.0 in., 22 ga.  Short bevel or Quincke spinal needle  Needle Placement: Transforaminal  Findings:    -Comments: Excellent flow of contrast along the nerve, nerve root and into the epidural space.  Procedure Details: After squaring off the end-plates to get a true AP view, the C-arm was positioned so that an oblique view of the foramen as noted above was visualized. The target area is just inferior to the "nose of the scotty dog" or sub pedicular. The soft tissues overlying this structure were infiltrated with 2-3 ml. of 1% Lidocaine without Epinephrine.  The spinal needle was inserted toward the target using a "trajectory" view along the fluoroscope beam.  Under AP and lateral visualization, the needle was advanced so it did not puncture dura and was located close the 6 O'Clock position of the pedical in AP tracterory. Biplanar projections were used to confirm position. Aspiration was confirmed to be negative for CSF and/or blood. A 1-2 ml. volume of Isovue-250 was injected and flow of contrast was noted  at each level. Radiographs were obtained for documentation purposes.   After attaining the desired flow of contrast documented above, a 0.5 to 1.0 ml test dose of 0.25% Marcaine was injected into each respective transforaminal space.  The patient was observed for 90 seconds post injection.  After no sensory deficits were reported, and normal lower extremity  motor function was noted,   the above injectate was administered so that equal amounts of the injectate were placed at each foramen (level) into the transforaminal epidural space.   Additional Comments:  The patient tolerated the procedure well Dressing: 2 x 2 sterile gauze and Band-Aid    Post-procedure details: Patient was observed during the procedure. Post-procedure instructions were reviewed.  Patient left the clinic in stable condition.      Clinical History: Acute Interface, Incoming Rad Results - 02/18/2020 4:13 PM EDT  INDICATION: Spinal stenosis, lumbosacral region   COMPARISON: Correlation made with CT abdomen/pelvis 01/22/2020, no prior lumbar spine MRIs available.   TECHNIQUE: Multiplanar, multi-sequence surface-coil MR imaging of the lumbar spine was performed without contrast.   FINDINGS:  The most caudal well-formed disc is considered L5-S1. Images were labeled.   . Alignment: Normal.   . Vertebrae: No suspicious marrow signal abnormalities to suggest malignancy. No compression fractures or marrow edema.   Marland Kitchen Spinal canal: No abnormal collection or neoplastic lesion is evident by non-contrast technique. Any stenosis discussed below.   . Conus: Normal signal and position. No compressive lesion.   . Degenerative disease:   By level   . T11-T12: No significant focal abnormality.   . T12-L1: Mild disc space narrowing and small anterior vertebral osteophytes. No other abnormality. No stenosis.   Marland Kitchen L1-L2: Small anterior vertebral osteophytes. No other significant abnormality. No stenosis.   Marland Kitchen L2-L3: Trace broad disc bulge. No herniation or other abnormality. No stenosis.   Marland Kitchen L3-L4: Disc desiccation with mild disc space narrowing, minimal endplate osteophyte formation. Mild broad disc bulge. Small superimposed left subarticular region disc protrusion with abutment of the descending left L4 nerve root. Nerve root detail assessment is suboptimal due to  motion artifact in this region. Small disc osteophyte complex projecting into the inferior aspect of right neural foramen without significant foraminal stenosis or nerve impingement. Developmentally mild narrowing of the spinal canal due to an element of short pedicles. Otherwise no significant central stenosis.   Marland Kitchen L4-L5: Disc desiccation with mild disc space narrowing. Concentric disc bulge with superimposed broad posterior protrusion with mild indentation of the thecal sac, mild central stenosis in combination with somewhat narrow spinal canal developmentally. Left greater than right subarticular recess stenosis with abutment and possible impingement of the descending left L5 nerve root. Nerve root detail evaluation again is suboptimal due to motion artifact in this region. Mild bilateral facet hypertrophy, trace facet joint effusions. Minimal bilateral foraminal stenosis without exiting L4 nerve compression.   Marland Kitchen L5-S1: Normal disc height and signal. Minimal posterior disc bulge otherwise normal disc. No significant stenosis. Facet joints are unremarkable..   . Included sacrum/Ilium: No fracture or mass is evident.   . Soft tissues: No mass or fluid collection. Psoas muscles are symmetric without significant atrophy.   . Incidental findings: None of significance identified.    IMPRESSION:  Mild lumbar spondylosis. 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 At L4-5 there is resultant mild central stenosis and asymmetric left subarticular recess stenosis with potential impingement of descending left L5 nerve root. Assessment of  nerve root detail is suboptimal due to motion artifact. 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   No impingement of exiting nerves in the foramina.   Electronically Signed by: Silvano Rusk     Objective:  VS:  HT:    WT:   BMI:     BP:104/67  HR:62bpm  TEMP: ( )  RESP:   Physical Exam Vitals and nursing note reviewed.  Constitutional:      General: He is not in acute distress.    Appearance: Normal appearance. He is not ill-appearing.  HENT:     Head: Normocephalic and atraumatic.     Right Ear: External ear normal.     Left Ear: External ear normal.     Nose: No congestion.  Eyes:     Extraocular Movements: Extraocular movements intact.  Cardiovascular:     Rate and Rhythm: Normal rate.     Pulses: Normal pulses.  Pulmonary:     Effort: Pulmonary effort is normal. No respiratory distress.  Abdominal:     General: There is no distension.     Palpations: Abdomen is soft.  Musculoskeletal:        General: No tenderness or signs of injury.     Cervical back: Neck supple.     Right lower leg: No edema.     Left lower leg: No edema.     Comments: Patient has good distal strength without clonus.  Positive left slump test.  Skin:    Findings: No erythema or rash.  Neurological:     General: No focal deficit present.     Mental Status: He is alert and oriented to person, place, and time.     Sensory: No sensory deficit.     Motor: No weakness or abnormal muscle tone.     Coordination: Coordination normal.  Psychiatric:        Mood and Affect: Mood normal.        Behavior: Behavior normal.      Imaging: XR C-ARM NO REPORT  Result Date: 10/15/2020 Please see Notes tab for imaging impression.

## 2020-10-16 NOTE — Progress Notes (Signed)
Nathaniel Jones - 40 y.o. male MRN [id number]  Date of birth: 11/21/1980  Office Visit Note: Visit Date: 10/15/2020 PCP: Clovis RileyMitchell, L.August Saucerean, MD Referred by: Clovis RileyMitchell, L.August Saucerean, MD  Subjective: Chief Complaint  Patient presents with  . Right Hand - Numbness  . Left Hand - Numbness   HPI:  Nathaniel Jones is a 40 y.o. male who comes in today at the request of Rexene EdisonGil Clark, PA-C for electrodiagnostic study of the bilateral upper extremities.  Patient is Right hand dominant.  He reports chronic worsening severe at times pain numbness and tingling particularly in the palms of both hands and in predominantly all the fingers may be more radial than ulnar but essentially globally.  This is worse at night with nocturnal complaints and worse with driving and using his hands.  No frank radicular symptoms.  No history of diabetes or prior electrodiagnostic studies.  He does have a history of epicondylitis on the right.   ROS Otherwise per HPI.  Assessment & Plan: Visit Diagnoses:    ICD-10-CM   1. Paresthesia of skin  R20.2 NCV with EMG (electromyography)  2. Bilateral hand numbness  R20.0     Plan: Impression: The above electrodiagnostic study is ABNORMAL and reveals evidence of a moderate bilateral median nerve entrapment at the wrist (carpal tunnel syndrome) affecting sensory and motor components. There is no significant electrodiagnostic evidence of any other focal nerve entrapment, brachial plexopathy or radiculopathy.   Recommendations: 1.  Follow-up with referring physician. 2.  Continue current management of symptoms. 3.  Continue use of resting splint at night-time and as needed during the day. 4.  Suggest surgical evaluation.   Meds & Orders: No orders of the defined types were placed in this encounter.   Orders Placed This Encounter  Procedures  . NCV with EMG (electromyography)    Follow-up: Return in about 2 weeks (around 10/29/2020) for Rexene EdisonGil Clark, P.A.-C.   Procedures: No procedures  performed  EMG & NCV Findings: Evaluation of the left median motor nerve showed prolonged distal onset latency (4.9 ms).  The right median motor nerve showed prolonged distal onset latency (5.7 ms) and decreased conduction velocity (Elbow-Wrist, 46 m/s).  The left ulnar motor nerve showed decreased conduction velocity (A Elbow-B Elbow, 52 m/s).  The left median (across palm) sensory and the right median (across palm) sensory nerves showed no response (Palm) and prolonged distal peak latency (L5.1, R6.2 ms).  All remaining nerves (as indicated in the following tables) were within normal limits.  Left vs. Right side comparison data for the median motor nerve indicates abnormal L-R latency difference (0.8 ms).  The ulnar motor nerve indicates abnormal L-R velocity difference (B Elbow-Wrist, 12 m/s).  All remaining left vs. right side differences were within normal limits.    All examined muscles (as indicated in the following table) showed no evidence of electrical instability.    Impression: The above electrodiagnostic study is ABNORMAL and reveals evidence of a moderate bilateral median nerve entrapment at the wrist (carpal tunnel syndrome) affecting sensory and motor components. There is no significant electrodiagnostic evidence of any other focal nerve entrapment, brachial plexopathy or radiculopathy.   Recommendations: 1.  Follow-up with referring physician. 2.  Continue current management of symptoms. 3.  Continue use of resting splint at night-time and as needed during the day. 4.  Suggest surgical evaluation.  ___________________________ Elease HashimotoFred Alivea Gladson FAAPMR Board Certified, American Board of Physical Medicine and Rehabilitation    Nerve Conduction Studies Anti Sensory Summary  Table   Stim Site NR Peak (ms) Norm Peak (ms) P-T Amp (V) Norm P-T Amp Site1 Site2 Delta-P (ms) Dist (cm) Vel (m/s) Norm Vel (m/s)  Left Median Acr Palm Anti Sensory (2nd Digit)  32.2C  Wrist    *5.1 <3.6 13.1  >10 Wrist Palm  0.0    Palm *NR  <2.0          Right Median Acr Palm Anti Sensory (2nd Digit)  31.6C  Wrist    *6.2 <3.6 12.8 >10 Wrist Palm  0.0    Palm *NR  <2.0          Left Radial Anti Sensory (Base 1st Digit)  32.2C  Wrist    2.2 <3.1 25.9  Wrist Base 1st Digit 2.2 0.0    Right Radial Anti Sensory (Base 1st Digit)  31.7C  Wrist    2.4 <3.1 12.4  Wrist Base 1st Digit 2.4 0.0    Left Ulnar Anti Sensory (5th Digit)  32.4C  Wrist    3.7 <3.7 18.7 >15.0 Wrist 5th Digit 3.7 14.0 38 >38  Right Ulnar Anti Sensory (5th Digit)  31.8C  Wrist    3.7 <3.7 20.3 >15.0 Wrist 5th Digit 3.7 14.0 38 >38   Motor Summary Table   Stim Site NR Onset (ms) Norm Onset (ms) O-P Amp (mV) Norm O-P Amp Site1 Site2 Delta-0 (ms) Dist (cm) Vel (m/s) Norm Vel (m/s)  Left Median Motor (Abd Poll Brev)  32.1C  Wrist    *4.9 <4.2 6.5 >5 Elbow Wrist 6.0 30.0 50 >50  Elbow    10.9  6.0         Right Median Motor (Abd Poll Brev)  31.7C  Wrist    *5.7 <4.2 5.7 >5 Elbow Wrist 6.5 30.0 *46 >50  Elbow    12.2  3.9         Left Ulnar Motor (Abd Dig Min)  32C  Wrist    3.1 <4.2 10.5 >3 B Elbow Wrist 4.1 27.0 66 >53  B Elbow    7.2  10.2  A Elbow B Elbow 2.1 11.0 *52 >53  A Elbow    9.3  10.6         Right Ulnar Motor (Abd Dig Min)  31.5C  Wrist    3.1 <4.2 10.4 >3 B Elbow Wrist 5.1 27.5 54 >53  B Elbow    8.2  8.5  A Elbow B Elbow 1.7 11.0 65 >53  A Elbow    9.9  10.0          EMG   Side Muscle Nerve Root Ins Act Fibs Psw Amp Dur Poly Recrt Int Dennie Bible Comment  Right Abd Poll Brev Median C8-T1 Nml Nml Nml Nml Nml 0 Nml Nml   Right 1stDorInt Ulnar C8-T1 Nml Nml Nml Nml Nml 0 Nml Nml   Right PronatorTeres Median C6-7 Nml Nml Nml Nml Nml 0 Nml Nml   Right Biceps Musculocut C5-6 Nml Nml Nml Nml Nml 0 Nml Nml   Right Deltoid Axillary C5-6 Nml Nml Nml Nml Nml 0 Nml Nml     Nerve Conduction Studies Anti Sensory Left/Right Comparison   Stim Site L Lat (ms) R Lat (ms) L-R Lat (ms) L Amp (V) R Amp (V) L-R Amp (%)  Site1 Site2 L Vel (m/s) R Vel (m/s) L-R Vel (m/s)  Median Acr Palm Anti Sensory (2nd Digit)  32.2C  Wrist *5.1 *6.2 1.1 13.1 12.8 2.3 Wrist [employer]  Radial Anti Sensory (Base 1st Digit)  32.2C  Wrist 2.2 2.4 0.2 25.9 12.4 52.1 Wrist Base 1st Digit     Ulnar Anti Sensory (5th Digit)  32.4C  Wrist 3.7 3.7 0.0 18.7 20.3 7.9 Wrist 5th Digit 38 38 0   Motor Left/Right Comparison   Stim Site L Lat (ms) R Lat (ms) L-R Lat (ms) L Amp (mV) R Amp (mV) L-R Amp (%) Site1 Site2 L Vel (m/s) R Vel (m/s) L-R Vel (m/s)  Median Motor (Abd Poll Brev)  32.1C  Wrist *4.9 *5.7 *0.8 6.5 5.7 12.3 Elbow Wrist 50 *46 4  Elbow 10.9 12.2 1.3 6.0 3.9 35.0       Ulnar Motor (Abd Dig Min)  32C  Wrist 3.1 3.1 0.0 10.5 10.4 1.0 B Elbow Wrist 66 54 *12  B Elbow 7.2 8.2 1.0 10.2 8.5 16.7 A Elbow B Elbow *52 65 13  A Elbow 9.3 9.9 0.6 10.6 10.0 5.7          Waveforms:                      Clinical History: Acute Interface, Incoming Rad Results - 02/18/2020 4:13 PM EDT  INDICATION: Spinal stenosis, lumbosacral region   COMPARISON: Correlation made with CT abdomen/pelvis 01/22/2020, no prior lumbar spine MRIs available.   TECHNIQUE: Multiplanar, multi-sequence surface-coil MR imaging of the lumbar spine was performed without contrast.   FINDINGS:  The most caudal well-formed disc is considered L5-S1. Images were labeled.   . Alignment: Normal.   . Vertebrae: No suspicious marrow signal abnormalities to suggest malignancy. No compression fractures or marrow edema.   Marland Kitchen Spinal canal: No abnormal collection or neoplastic lesion is evident by non-contrast technique. Any stenosis discussed below.   . Conus: Normal signal and position. No compressive lesion.   . Degenerative disease:   By level   . T11-T12: No significant focal abnormality.   . T12-L1: Mild disc space narrowing and small anterior vertebral osteophytes. No other abnormality. No stenosis.   Marland Kitchen L1-L2: Small  anterior vertebral osteophytes. No other significant abnormality. No stenosis.   Marland Kitchen L2-L3: Trace broad disc bulge. No herniation or other abnormality. No stenosis.   Marland Kitchen L3-L4: Disc desiccation with mild disc space narrowing, minimal endplate osteophyte formation. Mild broad disc bulge. Small superimposed left subarticular region disc protrusion with abutment of the descending left L4 nerve root. Nerve root detail assessment is suboptimal due to motion artifact in this region. Small disc osteophyte complex projecting into the inferior aspect of right neural foramen without significant foraminal stenosis or nerve impingement. Developmentally mild narrowing of the spinal canal due to an element of short pedicles. Otherwise no significant central stenosis.   Marland Kitchen L4-L5: Disc desiccation with mild disc space narrowing. Concentric disc bulge with superimposed broad posterior protrusion with mild indentation of the thecal sac, mild central stenosis in combination with somewhat narrow spinal canal developmentally. Left greater than right subarticular recess stenosis with abutment and possible impingement of the descending left L5 nerve root. Nerve root detail evaluation again is suboptimal due to motion artifact in this region. Mild bilateral facet hypertrophy, trace facet joint effusions. Minimal bilateral foraminal stenosis without exiting L4 nerve compression.   Marland Kitchen L5-S1: Normal disc height and signal. Minimal posterior disc bulge otherwise normal disc. No significant stenosis. Facet joints are unremarkable..   . Included sacrum/Ilium: No fracture or mass is evident.   . Soft tissues: No mass or fluid collection. Psoas muscles are symmetric  without significant atrophy.   . Incidental findings: None of significance identified.    IMPRESSION:  Mild lumbar spondylosis. 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 At L4-5 there is resultant mild  central stenosis and asymmetric left subarticular recess stenosis with potential impingement of descending left L5 nerve root. Assessment of nerve root detail is suboptimal due to motion artifact. 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   No impingement of exiting nerves in the foramina.   Electronically Signed by: Silvano Rusk     Objective:  VS:  HT:    WT:   BMI:     BP:   HR: bpm  TEMP: ( )  RESP:  Physical Exam Musculoskeletal:        General: No tenderness.     Comments: Inspection reveals no atrophy of the bilateral APB or FDI or hand intrinsics. There is no swelling, color changes, allodynia or dystrophic changes. There is 5 out of 5 strength in the bilateral wrist extension, finger abduction and long finger flexion. There is intact sensation to light touch in all dermatomal and peripheral nerve distributions. here is a negative Hoffmann's test bilaterally.  Skin:    General: Skin is warm and dry.     Findings: No erythema or rash.  Neurological:     General: No focal deficit present.     Mental Status: He is alert and oriented to person, place, and time.     Sensory: No sensory deficit.     Motor: No weakness or abnormal muscle tone.     Coordination: Coordination normal.     Gait: Gait normal.  Psychiatric:        Mood and Affect: Mood normal.        Behavior: Behavior normal.        Thought Content: Thought content normal.      Imaging: No results found.

## 2020-10-19 DIAGNOSIS — Z0279 Encounter for issue of other medical certificate: Secondary | ICD-10-CM

## 2020-10-25 ENCOUNTER — Ambulatory Visit (HOSPITAL_COMMUNITY)
Admission: EM | Admit: 2020-10-25 | Discharge: 2020-10-25 | Disposition: A | Discharge status: Home / Self Care | Payer: 59 | Attending: Emergency Medicine | Admitting: Emergency Medicine

## 2020-10-25 ENCOUNTER — Other Ambulatory Visit: Payer: Self-pay

## 2020-10-25 ENCOUNTER — Encounter (HOSPITAL_COMMUNITY): Payer: Self-pay

## 2020-10-25 DIAGNOSIS — R6883 Chills (without fever): Secondary | ICD-10-CM | POA: Diagnosis not present

## 2020-10-25 DIAGNOSIS — J069 Acute upper respiratory infection, unspecified: Secondary | ICD-10-CM | POA: Diagnosis not present

## 2020-10-25 DIAGNOSIS — J111 Influenza due to unidentified influenza virus with other respiratory manifestations: Secondary | ICD-10-CM | POA: Insufficient documentation

## 2020-10-25 DIAGNOSIS — R519 Headache, unspecified: Secondary | ICD-10-CM | POA: Diagnosis not present

## 2020-10-25 DIAGNOSIS — R52 Pain, unspecified: Secondary | ICD-10-CM | POA: Insufficient documentation

## 2020-10-25 DIAGNOSIS — Z20822 Contact with and (suspected) exposure to covid-19: Secondary | ICD-10-CM | POA: Insufficient documentation

## 2020-10-25 LAB — SARS CORONAVIRUS 2 (TAT 6-24 HRS): SARS Coronavirus 2: NEGATIVE

## 2020-10-25 LAB — POC INFLUENZA A AND B ANTIGEN (URGENT CARE ONLY)
Influenza A Ag: NEGATIVE
Influenza B Ag: NEGATIVE

## 2020-10-25 NOTE — ED Provider Notes (Signed)
[facility]  ____________________________________________  Time seen: Approximately 6:53 PM  I have reviewed the triage vital signs and the nursing notes.   HISTORY  Chief Complaint Chills, Headache, and Generalized Body Aches   Historian Patient     HPI Nathaniel Jones is a 40 y.o. male presents to the urgent care with headache, body aches and chills that started yesterday.  Patient has also had sporadic cough.  No vomiting or diarrhea.  Patient endorses fever of 102 at home.  No chest pain, chest tightness or abdominal pain.   Past Medical History:  Diagnosis Date  . Hypertension      Immunizations up to date:  Yes.     Past Medical History:  Diagnosis Date  . Hypertension     Patient Active Problem List   Diagnosis Date Noted  . Sleep disturbance 10/15/2020  . Rectal bleeding 10/15/2020  . Primary generalized (osteo)arthritis 10/15/2020  . Prediabetes 10/15/2020  . Obesity 10/15/2020  . Neck pain 10/15/2020  . Muscle contraction headache 10/15/2020  . Decreased testosterone level 10/15/2020  . Suspected COVID-19 virus infection 12/24/2019  . Unilateral primary osteoarthritis, left knee 05/17/2018  . Unilateral primary osteoarthritis, right knee 05/17/2018  . Chronic pain of both knees 05/17/2018  . Biceps rupture, proximal, right, initial encounter 05/22/2017  . Chronic bilateral low back pain without sciatica 03/08/2017  . Lateral epicondylitis, right elbow 03/08/2017  . It band syndrome, left 03/08/2017    Past Surgical History:  Procedure Laterality Date  . KNEE SURGERY Right   . none      Prior to Admission medications   Medication Sig Start Date End Date Taking? Authorizing Provider  allopurinol (ZYLOPRIM) 100 MG tablet 1 tablet 06/05/20   [provider]  Biotin 1000 MCG tablet 1 tablet    [provider]  colchicine 0.6 MG tablet 1 tablet    [provider]  Cranberry 300 MG tablet See admin instructions.     [provider]  cyclobenzaprine (FLEXERIL) 5 MG tablet Take 1 tablet (5 mg total) by mouth 3 (three) times daily as needed. 09/10/20   Kirtland Bouchard, PA-C  Ferrous Sulfate (IRON) 325 (65 Fe) MG TABS 1 tablet    [provider]  Glucosamine-Chondroitin 500-400 MG CAPS 1 capsule with a meal    [provider]  linaclotide (LINZESS) 145 MCG CAPS capsule 1 capsule    [provider]  Lysine 500 MG CAPS See admin instructions.    [provider]  Misc Natural Products (TART CHERRY ADVANCED) CAPS 1 capsule    [provider]  Multiple Vitamins-Minerals (ONE-A-DAY MENS HEALTH FORMULA) TABS See admin instructions.    [provider]  nabumetone (RELAFEN) 750 MG tablet Take 1 tablet (750 mg total) by mouth 2 (two) times daily as needed. 09/10/20   Kirtland Bouchard, PA-C  Omega-3 Fatty Acids (FISH OIL) 1000 MG CAPS 1 capsule    [provider]  pyridOXINE (VITAMIN B-6) 100 MG tablet 1 tablet    [provider]  QUEtiapine (SEROQUEL) 25 MG tablet Take 25 mg by mouth daily.    [provider]  Saw Palmetto, Serenoa repens, (SAW PALMETTO EXTRACT) 160 MG CAPS See admin instructions.    [provider]  sildenafil (VIAGRA) 100 MG tablet Take 100 mg by mouth daily as needed. 05/13/20   [provider]  Specialty Vitamins Products (COLLAGEN ULTRA) CAPS See admin instructions.    [provider]  Turmeric 500  MG TABS See admin instructions.    [provider]  vitamin B-12 (CYANOCOBALAMIN) 1000 MCG tablet 1 tablet    [provider]    Allergies Coconut oil and Ibuprofen  Family History  Problem Relation Age of Onset  . Hypertension Other   . Diabetes Other   . Hypertension Mother   . Diabetes Mother   . Hypertension Father   . Diabetes Brother   . Stroke Maternal Grandmother   . Diabetes Maternal Grandmother   . High Cholesterol Maternal Grandmother   . Stroke  Maternal Grandfather     Social History Social History   Tobacco Use  . Smoking status: Never Smoker  . Smokeless tobacco: Never Used  Substance Use Topics  . Alcohol use: No  . Drug use: No      Review of Systems  Constitutional: Patient has fever.  Eyes: No visual changes. No discharge ENT: Patient has congestion.  Cardiovascular: no chest pain. Respiratory: Patient has cough.  Gastrointestinal: No abdominal pain.  No nausea, no vomiting. No diarrhea.  Genitourinary: Negative for dysuria. No hematuria Musculoskeletal: Patient has myalgias.  Skin: Negative for rash, abrasions, lacerations, ecchymosis. Neurological: Patient has headache, no focal weakness or numbness.     ____________________________________________   PHYSICAL EXAM:  VITAL SIGNS: ED Triage Vitals  Enc Vitals Group     BP 10/25/20 1823 (!) 141/83     Pulse Rate 10/25/20 1823 90     Resp 10/25/20 1823 20     Temp 10/25/20 1823 99.8 F (37.7 C)     Temp Source 10/25/20 1823 Oral     SpO2 10/25/20 1823 98 %     Weight --      Height --      Head Circumference --      Peak Flow --      Pain Score 10/25/20 1820 9     Pain Loc --      Pain Edu? --      Excl. in GC? --      Constitutional: Alert and oriented. Patient is lying supine. Eyes: Conjunctivae are normal. PERRL. EOMI. Head: Atraumatic. ENT:      Ears: Tympanic membranes are mildly injected with mild effusion bilaterally.       Nose: No congestion/rhinnorhea.      Mouth/Throat: Mucous membranes are moist. Posterior pharynx is mildly erythematous.  Hematological/Lymphatic/Immunilogical: No cervical lymphadenopathy.  Cardiovascular: Normal rate, regular rhythm. Normal S1 and S2.  Good peripheral circulation. Respiratory: Normal respiratory effort without tachypnea or retractions. Lungs CTAB. Good air entry to the bases with no decreased or absent breath sounds. Gastrointestinal: Bowel sounds 4 quadrants. Soft and nontender to  palpation. No guarding or rigidity. No palpable masses. No distention. No CVA tenderness. Musculoskeletal: Full range of motion to all extremities. No gross deformities appreciated. Neurologic:  Normal speech and language. No gross focal neurologic deficits are appreciated.  Skin:  Skin is warm, dry and intact. No rash noted. Psychiatric: Mood and affect are normal. Speech and behavior are normal. Patient exhibits appropriate insight and judgement.   ____________________________________________   LABS (all labs ordered are listed, but only abnormal results are displayed)  Labs Reviewed  SARS CORONAVIRUS 2 (TAT 6-24 HRS)  INFLUENZA PANEL BY PCR (TYPE A & B)   ____________________________________________  EKG   ____________________________________________  RADIOLOGY   No results found.  ____________________________________________    PROCEDURES  Procedure(s) performed:     Procedures     Medications - No data to  display   ____________________________________________   INITIAL IMPRESSION / ASSESSMENT AND PLAN / ED COURSE  Pertinent labs & imaging results that were available during my care of the patient were reviewed by me and considered in my medical decision making (see chart for details).      Assessment and plan Viral URI 40 year old male presents to the urgent care with chills, headache and body aches that started yesterday.  Vital signs are reassuring at triage.  Patient feels comfortable obtaining COVID-19 and influenza results at home through MyChart.  Patient was advised to take Tylenol and ibuprofen alternating for headache and body aches.  He was advised to stay hydrated at home.  All patient questions were answered.     ____________________________________________  FINAL CLINICAL IMPRESSION(S) / ED DIAGNOSES  Final diagnoses:  Viral upper respiratory tract infection      NEW MEDICATIONS STARTED DURING THIS VISIT:  ED Discharge  Orders    None          This chart was dictated using voice recognition software/Dragon. Despite best efforts to proofread, errors can occur which can change the meaning. Any change was purely unintentional.     Orvil Feil, PA-C 10/25/20 1855

## 2020-10-25 NOTE — Discharge Instructions (Signed)
Take Tylenol and ibuprofen for headache and body aches. Continue to take Mucinex DM for cough. Results for flu testing and COVID-19 testing will posterior MyChart.

## 2020-10-25 NOTE — ED Triage Notes (Signed)
Pt presents with chills, headache, and generalized body aches since yesterday.

## 2020-10-26 ENCOUNTER — Ambulatory Visit: Payer: 59 | Admitting: Physician Assistant

## 2020-10-27 ENCOUNTER — Other Ambulatory Visit: Payer: Self-pay

## 2020-10-27 ENCOUNTER — Ambulatory Visit (HOSPITAL_COMMUNITY)
Admission: EM | Admit: 2020-10-27 | Discharge: 2020-10-27 | Disposition: A | Discharge status: Home / Self Care | Payer: 59 | Attending: Family Medicine | Admitting: Family Medicine

## 2020-10-27 ENCOUNTER — Encounter (HOSPITAL_COMMUNITY): Payer: Self-pay

## 2020-10-27 DIAGNOSIS — R52 Pain, unspecified: Secondary | ICD-10-CM | POA: Diagnosis not present

## 2020-10-27 DIAGNOSIS — R509 Fever, unspecified: Secondary | ICD-10-CM | POA: Insufficient documentation

## 2020-10-27 DIAGNOSIS — Z20822 Contact with and (suspected) exposure to covid-19: Secondary | ICD-10-CM | POA: Diagnosis not present

## 2020-10-27 DIAGNOSIS — R519 Headache, unspecified: Secondary | ICD-10-CM | POA: Insufficient documentation

## 2020-10-27 DIAGNOSIS — I10 Essential (primary) hypertension: Secondary | ICD-10-CM | POA: Insufficient documentation

## 2020-10-27 MED ORDER — KETOROLAC TROMETHAMINE 60 MG/2ML IM SOLN
INTRAMUSCULAR | Status: AC
  Filled 2020-10-27: qty 2

## 2020-10-27 MED ORDER — KETOROLAC TROMETHAMINE 60 MG/2ML IM SOLN
60.0000 mg | Freq: Once | INTRAMUSCULAR | Status: AC
  Administered 2020-10-27: 60 mg via INTRAMUSCULAR

## 2020-10-27 NOTE — ED Triage Notes (Signed)
Pt c/o HA, chills, general malaise, fever since Saturday. Was evaluated here for same and given instructions to use ibuprofen/tylenol which pt states he has been taking w/o improvement to symptoms. Also c/o loss of appetite and chills. States he had some nausea last night that pt attributes possibly to ibuprofen on empty stomach.    Has been able to drink po fluids w/o difficulty. Pt asking for Rx to ease HA.  Last took tylenol rapid relief (1000mg ) approx 1 hour PTA.  Denies SOB, v/d, sore throat.

## 2020-10-27 NOTE — ED Provider Notes (Signed)
[facility]    CSN: [id number] Arrival date & time: 10/27/20  1632      History   Chief Complaint Chief Complaint  Patient presents with  . Headache    HPI Nathaniel Jones is a 40 y.o. male.   Here today with persistent severe headache, malaise, fatigue, fever, chills, body aches x 4 days now. Some nausea, initially some cough and sore throat that has resolved. Denies CP, SOB, vomiting, diarrhea, ear pain. Taking tylenol and had taken some ibuprofen but stopped because it bothered his stomach with minimal relief. Seen 2 days ago, tested for flu and COVID both of which came back negative. No known sick contacts. No known pertinent chronic medical problems.      Past Medical History:  Diagnosis Date  . Hypertension     Patient Active Problem List   Diagnosis Date Noted  . Sleep disturbance 10/15/2020  . Rectal bleeding 10/15/2020  . Primary generalized (osteo)arthritis 10/15/2020  . Prediabetes 10/15/2020  . Obesity 10/15/2020  . Neck pain 10/15/2020  . Muscle contraction headache 10/15/2020  . Decreased testosterone level 10/15/2020  . Suspected COVID-19 virus infection 12/24/2019  . Unilateral primary osteoarthritis, left knee 05/17/2018  . Unilateral primary osteoarthritis, right knee 05/17/2018  . Chronic pain of both knees 05/17/2018  . Biceps rupture, proximal, right, initial encounter 05/22/2017  . Chronic bilateral low back pain without sciatica 03/08/2017  . Lateral epicondylitis, right elbow 03/08/2017  . It band syndrome, left 03/08/2017    Past Surgical History:  Procedure Laterality Date  . KNEE SURGERY Right   . none         Home Medications    Prior to Admission medications   Medication Sig Start Date End Date Taking? Authorizing Provider  QUEtiapine (SEROQUEL) 25 MG tablet Take 25 mg by mouth daily.   Yes [provider]  allopurinol (ZYLOPRIM) 100 MG tablet 1 tablet 06/05/20   [provider]  Biotin 1000 MCG  tablet 1 tablet    [provider]  colchicine 0.6 MG tablet 1 tablet    [provider]  Cranberry 300 MG tablet See admin instructions.    [provider]  cyclobenzaprine (FLEXERIL) 5 MG tablet Take 1 tablet (5 mg total) by mouth 3 (three) times daily as needed. 09/10/20   Kirtland Bouchard, PA-C  Ferrous Sulfate (IRON) 325 (65 Fe) MG TABS 1 tablet    [provider]  Glucosamine-Chondroitin 500-400 MG CAPS 1 capsule with a meal    [provider]  linaclotide (LINZESS) 145 MCG CAPS capsule 1 capsule    [provider]  Lysine 500 MG CAPS See admin instructions.    [provider]  Misc Natural Products (TART CHERRY ADVANCED) CAPS 1 capsule    [provider]  Multiple Vitamins-Minerals (ONE-A-DAY MENS HEALTH FORMULA) TABS See admin instructions.    [provider]  nabumetone (RELAFEN) 750 MG tablet Take 1 tablet (750 mg total) by mouth 2 (two) times daily as needed. 09/10/20   Kirtland Bouchard, PA-C  Omega-3 Fatty Acids (FISH OIL) 1000 MG CAPS 1 capsule    [provider]  pyridOXINE (VITAMIN B-6) 100 MG tablet 1 tablet    [provider]  Saw Palmetto, Serenoa repens, (SAW PALMETTO EXTRACT) 160 MG CAPS See admin instructions.    [provider]  sildenafil (VIAGRA) 100 MG tablet Take 100 mg by mouth daily as needed. 05/13/20   [provider]  Specialty Vitamins  Products (COLLAGEN ULTRA) CAPS See admin instructions.    [provider]  Turmeric 500 MG TABS See admin instructions.    [provider]  vitamin B-12 (CYANOCOBALAMIN) 1000 MCG tablet 1 tablet    [provider]    Family History Family History  Problem Relation Age of Onset  . Hypertension Other   . Diabetes Other   . Hypertension Mother   . Diabetes Mother   . Hypertension Father   . Diabetes Brother   . Stroke Maternal Grandmother   . Diabetes Maternal Grandmother   . High  Cholesterol Maternal Grandmother   . Stroke Maternal Grandfather     Social History Social History   Tobacco Use  . Smoking status: Never Smoker  . Smokeless tobacco: Never Used  Substance Use Topics  . Alcohol use: No  . Drug use: No     Allergies   Coconut oil and Ibuprofen   Review of Systems Review of Systems PER HPI   Physical Exam Triage Vital Signs ED Triage Vitals  Enc Vitals Group     BP 10/27/20 1728 (!) 160/94     Pulse Rate 10/27/20 1728 (!) 105     Resp 10/27/20 1728 18     Temp 10/27/20 1728 100.2 F (37.9 C)     Temp Source 10/27/20 1728 Oral     SpO2 10/27/20 1728 100 %     Weight 10/27/20 1726 (!) 307 lb (139.3 kg)     Height 10/27/20 1726 6\' 3"  (1.905 m)     Head Circumference --      Peak Flow --      Pain Score 10/27/20 1726 10     Pain Loc --      Pain Edu? --      Excl. in GC? --    No data found.  Updated Vital Signs BP (!) 160/94 (BP Location: Left Arm)   Pulse (!) 105   Temp 100.2 F (37.9 C) (Oral)   Resp 18   Ht 6\' 3"  (1.905 m)   Wt (!) 307 lb (139.3 kg)   SpO2 100%   BMI 38.37 kg/m   Visual Acuity Right Eye Distance:   Left Eye Distance:   Bilateral Distance:    Right Eye Near:   Left Eye Near:    Bilateral Near:     Physical Exam Vitals and nursing note reviewed.  Constitutional:      Appearance: He is ill-appearing.  HENT:     Head: Atraumatic.     Right Ear: Tympanic membrane normal.     Left Ear: Tympanic membrane normal.     Nose: Nose normal.     Mouth/Throat:     Mouth: Mucous membranes are moist.     Pharynx: Oropharynx is clear. No oropharyngeal exudate or posterior oropharyngeal erythema.  Eyes:     Extraocular Movements: Extraocular movements intact.     Conjunctiva/sclera: Conjunctivae normal.  Cardiovascular:     Rate and Rhythm: Normal rate and regular rhythm.     Heart sounds: Normal heart sounds.  Pulmonary:     Effort: Pulmonary effort is normal. No respiratory distress.     Breath  sounds: Normal breath sounds. No wheezing or rales.  Abdominal:     General: Bowel sounds are normal. There is no distension.     Palpations: Abdomen is soft.     Tenderness: There is no abdominal tenderness. There is no guarding.  Musculoskeletal:  General: Normal range of motion.     Cervical back: Normal range of motion and neck supple.  Skin:    General: Skin is warm and dry.     Findings: No rash.  Neurological:     General: No focal deficit present.     Mental Status: He is alert and oriented to person, place, and time.     Cranial Nerves: No cranial nerve deficit.     Motor: No weakness.     Gait: Gait normal.  Psychiatric:        Mood and Affect: Mood normal.        Thought Content: Thought content normal.        Judgment: Judgment normal.      UC Treatments / Results  Labs (all labs ordered are listed, but only abnormal results are displayed) Labs Reviewed  SARS CORONAVIRUS 2 (TAT 6-24 HRS)    EKG   Radiology No results found.  Procedures Procedures (including critical care time)  Medications Ordered in UC Medications  ketorolac (TORADOL) injection 60 mg (60 mg Intramuscular Given 10/27/20 1801)    Initial Impression / Assessment and Plan / UC Course  I have reviewed the triage vital signs and the nursing notes.  Pertinent labs & imaging results that were available during my care of the patient were reviewed by me and considered in my medical decision making (see chart for details).     Overall exam reassuring, he is febrile, mildly tachycardic and hypertensive in triage today but O2 saturation 100% on room air. IM toradol given due to intolerance to NSAIDs PO at the moment for GI irritation which should help with fever and headache and will re-test for COVID as his initial negative test was 1 day into sxs. Work note given as he is still febrile and COVID pcr is pending. Continue OTC pain relievers, fluids, rest. Strict return precautions reviewed  for worsening sxs.   Final Clinical Impressions(s) / UC Diagnoses   Final diagnoses:  Fever, unspecified  Acute intractable headache, unspecified headache type  Body aches   Discharge Instructions   None    ED Prescriptions    None     PDMP not reviewed this encounter.   Particia Nearing, [place] 10/27/20 1835

## 2020-10-28 ENCOUNTER — Other Ambulatory Visit: Payer: Self-pay

## 2020-10-28 ENCOUNTER — Encounter (HOSPITAL_COMMUNITY): Payer: Self-pay

## 2020-10-28 ENCOUNTER — Emergency Department (HOSPITAL_COMMUNITY)
Admission: EM | Admit: 2020-10-28 | Discharge: 2020-10-29 | Disposition: A | Discharge status: Home / Self Care | Payer: 59 | Source: Home / Self Care | Attending: Emergency Medicine | Admitting: Emergency Medicine

## 2020-10-28 DIAGNOSIS — Z20822 Contact with and (suspected) exposure to covid-19: Secondary | ICD-10-CM | POA: Insufficient documentation

## 2020-10-28 DIAGNOSIS — R519 Headache, unspecified: Secondary | ICD-10-CM

## 2020-10-28 DIAGNOSIS — J189 Pneumonia, unspecified organism: Secondary | ICD-10-CM | POA: Diagnosis not present

## 2020-10-28 DIAGNOSIS — R509 Fever, unspecified: Secondary | ICD-10-CM

## 2020-10-28 DIAGNOSIS — I301 Infective pericarditis: Secondary | ICD-10-CM | POA: Diagnosis not present

## 2020-10-28 DIAGNOSIS — R059 Cough, unspecified: Secondary | ICD-10-CM | POA: Insufficient documentation

## 2020-10-28 DIAGNOSIS — Z79899 Other long term (current) drug therapy: Secondary | ICD-10-CM | POA: Insufficient documentation

## 2020-10-28 DIAGNOSIS — R112 Nausea with vomiting, unspecified: Secondary | ICD-10-CM | POA: Insufficient documentation

## 2020-10-28 DIAGNOSIS — I10 Essential (primary) hypertension: Secondary | ICD-10-CM | POA: Insufficient documentation

## 2020-10-28 DIAGNOSIS — M436 Torticollis: Secondary | ICD-10-CM | POA: Insufficient documentation

## 2020-10-28 LAB — BASIC METABOLIC PANEL
Anion gap: 12 (ref 5–15)
BUN: 10 mg/dL (ref 6–20)
CO2: 23 mmol/L (ref 22–32)
Calcium: 8.8 mg/dL — ABNORMAL LOW (ref 8.9–10.3)
Chloride: 97 mmol/L — ABNORMAL LOW (ref 98–111)
Creatinine, Ser: 1.13 mg/dL (ref 0.61–1.24)
GFR, Estimated: >60 mL/min (ref >60)
Glucose, Bld: 143 mg/dL — ABNORMAL HIGH (ref 70–99)
Potassium: 3.5 mmol/L (ref 3.5–5.1)
Sodium: 132 mmol/L — ABNORMAL LOW (ref 135–145)

## 2020-10-28 LAB — CBC
HCT: 42.2 % (ref 39.0–52.0)
Hemoglobin: 14.5 g/dL (ref 13.0–17.0)
MCH: 29.5 pg (ref 26.0–34.0)
MCHC: 34.4 g/dL (ref 30.0–36.0)
MCV: 85.9 fL (ref 80.0–100.0)
Platelets: 186 10*3/uL (ref 150–400)
RBC: 4.91 MIL/uL (ref 4.22–5.81)
RDW: 13.1 % (ref 11.5–15.5)
WBC: 11.2 10*3/uL — ABNORMAL HIGH (ref 4.0–10.5)
nRBC: 0 % (ref 0.0–0.2)

## 2020-10-28 LAB — SARS CORONAVIRUS 2 (TAT 6-24 HRS): SARS Coronavirus 2: NEGATIVE

## 2020-10-28 NOTE — ED Notes (Addendum)
Pt extremely diaphoretic in triage, reports being extremely diaphoretic since Saturday, pt reports that he took tylenol for his fever one hour ago.

## 2020-10-28 NOTE — ED Triage Notes (Signed)
Pt reports that he has been having a headache, fevers and neck pain and stiffness since Saturday, seen at UC twice and tested for covid twice, both negative. Pt is photosensitive, has been having n/v as well, pt diaphoretic.

## 2020-10-29 ENCOUNTER — Emergency Department (HOSPITAL_COMMUNITY): Payer: 59

## 2020-10-29 LAB — CSF CELL COUNT WITH DIFFERENTIAL
RBC Count, CSF: 54 /mm3 — ABNORMAL HIGH
RBC Count, CSF: 8 /mm3 — ABNORMAL HIGH
Tube #: 1
Tube #: 4
WBC, CSF: 0 /mm3 (ref 0–5)
WBC, CSF: 1 /mm3 (ref 0–5)

## 2020-10-29 LAB — HEPATIC FUNCTION PANEL
ALT: 46 U/L — ABNORMAL HIGH (ref 0–44)
AST: 48 U/L — ABNORMAL HIGH (ref 15–41)
Albumin: 3.1 g/dL — ABNORMAL LOW (ref 3.5–5.0)
Alkaline Phosphatase: 63 U/L (ref 38–126)
Bilirubin, Direct: 0.6 mg/dL — ABNORMAL HIGH (ref 0.0–0.2)
Indirect Bilirubin: 1.6 mg/dL — ABNORMAL HIGH (ref 0.3–0.9)
Total Bilirubin: 2.2 mg/dL — ABNORMAL HIGH (ref 0.3–1.2)
Total Protein: 7 g/dL (ref 6.5–8.1)

## 2020-10-29 LAB — PROTEIN, CSF: Total  Protein, CSF: 42 mg/dL (ref 15–45)

## 2020-10-29 LAB — LACTIC ACID, PLASMA
Lactic Acid, Venous: 2.1 mmol/L (ref 0.5–1.9)
Lactic Acid, Venous: 1.5 mmol/L (ref 0.5–1.9)

## 2020-10-29 LAB — GLUCOSE, CSF: Glucose, CSF: 79 mg/dL — ABNORMAL HIGH (ref 40–70)

## 2020-10-29 LAB — SARS CORONAVIRUS 2 BY RT PCR (HOSPITAL ORDER, PERFORMED IN ~~LOC~~ HOSPITAL LAB): SARS Coronavirus 2: NEGATIVE

## 2020-10-29 MED ORDER — DIPHENHYDRAMINE HCL 50 MG/ML IJ SOLN
25.0000 mg | Freq: Once | INTRAMUSCULAR | Status: AC
  Administered 2020-10-29: 25 mg via INTRAVENOUS
  Filled 2020-10-29: qty 1

## 2020-10-29 MED ORDER — MORPHINE SULFATE (PF) 4 MG/ML IV SOLN
6.0000 mg | Freq: Once | INTRAVENOUS | Status: AC
  Administered 2020-10-29: 6 mg via INTRAVENOUS
  Filled 2020-10-29: qty 2

## 2020-10-29 MED ORDER — PROCHLORPERAZINE EDISYLATE 10 MG/2ML IJ SOLN
10.0000 mg | Freq: Once | INTRAMUSCULAR | Status: AC
  Administered 2020-10-29: 10 mg via INTRAVENOUS
  Filled 2020-10-29: qty 2

## 2020-10-29 MED ORDER — SODIUM CHLORIDE 0.9 % IV BOLUS
1000.0000 mL | Freq: Once | INTRAVENOUS | Status: AC
  Administered 2020-10-29: 1000 mL via INTRAVENOUS

## 2020-10-29 MED ORDER — ACETAMINOPHEN 500 MG PO TABS
1000.0000 mg | ORAL_TABLET | Freq: Once | ORAL | Status: AC
  Administered 2020-10-29: 1000 mg via ORAL
  Filled 2020-10-29: qty 2

## 2020-10-29 MED ORDER — AMOXICILLIN-POT CLAVULANATE 875-125 MG PO TABS
1.0000 | ORAL_TABLET | Freq: Once | ORAL | Status: AC
  Administered 2020-10-29: 1 via ORAL
  Filled 2020-10-29: qty 1

## 2020-10-29 MED ORDER — AMOXICILLIN-POT CLAVULANATE 875-125 MG PO TABS: 1.0000 | ORAL_TABLET | Freq: Two times a day (BID) | ORAL | 0 refills | Status: DC

## 2020-10-29 MED ORDER — ACETAMINOPHEN 325 MG PO TABS
650.0000 mg | ORAL_TABLET | Freq: Once | ORAL | Status: AC
  Administered 2020-10-29: 650 mg via ORAL
  Filled 2020-10-29: qty 2

## 2020-10-29 MED ORDER — ONDANSETRON 4 MG PO TBDP: 4.0000 mg | ORAL_TABLET | Freq: Three times a day (TID) | ORAL | 0 refills | Status: DC | PRN

## 2020-10-29 NOTE — ED Provider Notes (Signed)
[facility] EMERGENCY DEPARTMENT Provider Note  CSN: [id number] Arrival date & time: 10/28/20 2227  Chief Complaint(s) Neck Pain  HPI AMMAR MOFFATT is a 40 y.o. male here for 1 week of fever, chills, severe headache, neck pain and stiffness as well as myalgias. Headache is severe. Frontal. Radiating to nuchal region. +photopobia. H/o headaches, but this feels different per patient. Has tried OTC meds w/o relief.  +Nausea and 1 episode of NBNB emesis this afternoon. Reported increased warmth with voiding, but no dysuria. Mild dry cough. Mild left sided abd discomfort with loose stools. No chest pain,sob.  Seen and tested negative twice for COVID on 1/23 and 1/25.    HPI  Past Medical History Past Medical History:  Diagnosis Date  . Hypertension    Patient Active Problem List   Diagnosis Date Noted  . Sleep disturbance 10/15/2020  . Rectal bleeding 10/15/2020  . Primary generalized (osteo)arthritis 10/15/2020  . Prediabetes 10/15/2020  . Obesity 10/15/2020  . Neck pain 10/15/2020  . Muscle contraction headache 10/15/2020  . Decreased testosterone level 10/15/2020  . Suspected COVID-19 virus infection 12/24/2019  . Unilateral primary osteoarthritis, left knee 05/17/2018  . Unilateral primary osteoarthritis, right knee 05/17/2018  . Chronic pain of both knees 05/17/2018  . Biceps rupture, proximal, right, initial encounter 05/22/2017  . Chronic bilateral low back pain without sciatica 03/08/2017  . Lateral epicondylitis, right elbow 03/08/2017  . It band syndrome, left 03/08/2017   Home Medication(s) Prior to Admission medications   Medication Sig Start Date End Date Taking? Authorizing Provider  amoxicillin-clavulanate (AUGMENTIN) 875-125 MG tablet Take 1 tablet by mouth every 12 (twelve) hours. 10/29/20  Yes Olegario Emberson, Amadeo Garnet, MD  ondansetron (ZOFRAN ODT) 4 MG disintegrating tablet Take 1 tablet (4 mg total) by mouth every 8 (eight) hours as  needed for up to 3 days for nausea or vomiting. 10/29/20 11/01/20 Yes Abbygale Lapid, Amadeo Garnet, MD  allopurinol (ZYLOPRIM) 100 MG tablet 1 tablet 06/05/20   [provider]  Biotin 1000 MCG tablet 1 tablet    [provider]  colchicine 0.6 MG tablet 1 tablet    [provider]  Cranberry 300 MG tablet See admin instructions.    [provider]  cyclobenzaprine (FLEXERIL) 5 MG tablet Take 1 tablet (5 mg total) by mouth 3 (three) times daily as needed. 09/10/20   Kirtland Bouchard, PA-C  Ferrous Sulfate (IRON) 325 (65 Fe) MG TABS 1 tablet    [provider]  Glucosamine-Chondroitin 500-400 MG CAPS 1 capsule with a meal    [provider]  linaclotide (LINZESS) 145 MCG CAPS capsule 1 capsule    [provider]  Lysine 500 MG CAPS See admin instructions.    [provider]  Misc Natural Products (TART CHERRY ADVANCED) CAPS 1 capsule    [provider]  Multiple Vitamins-Minerals (ONE-A-DAY MENS HEALTH FORMULA) TABS See admin instructions.    [provider]  nabumetone (RELAFEN) 750 MG tablet Take 1 tablet (750 mg total) by mouth 2 (two) times daily as needed. 09/10/20   Kirtland Bouchard, PA-C  Omega-3 Fatty Acids (FISH OIL) 1000 MG CAPS 1 capsule    [provider]  pyridOXINE (VITAMIN B-6) 100 MG tablet 1 tablet    [provider]  QUEtiapine (SEROQUEL) 25 MG tablet Take 25 mg by mouth daily.    [provider]  Saw Palmetto, Serenoa repens, (SAW PALMETTO EXTRACT) 160 MG CAPS See admin instructions.  [provider]  sildenafil (VIAGRA) 100 MG tablet Take 100 mg by mouth daily as needed. 05/13/20   [provider]  Specialty Vitamins Products (COLLAGEN ULTRA) CAPS See admin instructions.    [provider]  Turmeric 500 MG TABS See admin instructions.    [provider]  vitamin B-12 (CYANOCOBALAMIN) 1000 MCG tablet 1 tablet    [provider]                                                                                                                                     Past Surgical History Past Surgical History:  Procedure Laterality Date  . KNEE SURGERY Right   . none     Family History Family History  Problem Relation Age of Onset  . Hypertension Other   . Diabetes Other   . Hypertension Mother   . Diabetes Mother   . Hypertension Father   . Diabetes Brother   . Stroke Maternal Grandmother   . Diabetes Maternal Grandmother   . High Cholesterol Maternal Grandmother   . Stroke Maternal Grandfather     Social History Social History   Tobacco Use  . Smoking status: Never Smoker  . Smokeless tobacco: Never Used  Substance Use Topics  . Alcohol use: No  . Drug use: No   Allergies Coconut oil and Ibuprofen  Review of Systems Review of Systems All other systems are reviewed and are negative for acute change except as noted in the HPI  Physical Exam Vital Signs  I have reviewed the triage vital signs BP 126/75   Pulse 99   Temp (!) 101.9 F (38.8 C) (Oral)   Resp (!) 27   SpO2 100%   Physical Exam Vitals reviewed.  Constitutional:      General: He is not in acute distress.    Appearance: He is well-developed and well-nourished. He is ill-appearing. He is not diaphoretic.  HENT:     Head: Normocephalic and atraumatic.     Right Ear: Tympanic membrane normal.     Left Ear: Tympanic membrane normal.     Nose:     Right Sinus: Frontal sinus tenderness present.     Left Sinus: Frontal sinus tenderness present.     Mouth/Throat:     Pharynx: Oropharynx is clear.  Eyes:     General: No scleral icterus.       Right eye: No discharge.        Left eye: No discharge.     Extraocular Movements: EOM normal.     Conjunctiva/sclera: Conjunctivae normal.     Pupils: Pupils are equal, round, and reactive to light.  Neck:     Meningeal: Brudzinski's sign present.  Cardiovascular:     Rate and Rhythm:  Normal rate and regular rhythm.     Heart sounds: No murmur heard. No friction rub. No gallop.   Pulmonary:  Effort: Pulmonary effort is normal. No respiratory distress.     Breath sounds: Normal breath sounds. No stridor. No rales.  Abdominal:     General: There is no distension.     Palpations: Abdomen is soft.     Tenderness: There is no abdominal tenderness.  Musculoskeletal:        General: No tenderness or edema.     Cervical back: Normal range of motion and neck supple. No rigidity or torticollis. Muscular tenderness present.  Lymphadenopathy:     Cervical: No cervical adenopathy.  Skin:    General: Skin is warm and dry.     Findings: No erythema or rash.  Neurological:     Mental Status: He is alert and oriented to person, place, and time.  Psychiatric:        Mood and Affect: Mood and affect normal.     ED Results and Treatments Labs (all labs ordered are listed, but only abnormal results are displayed) Labs Reviewed  CBC - Abnormal; Notable for the following components:      Result Value   WBC 11.2 (*)    All other components within normal limits  BASIC METABOLIC PANEL - Abnormal; Notable for the following components:   Sodium 132 (*)    Chloride 97 (*)    Glucose, Bld 143 (*)    Calcium 8.8 (*)    All other components within normal limits  HEPATIC FUNCTION PANEL - Abnormal; Notable for the following components:   Albumin 3.1 (*)    AST 48 (*)    ALT 46 (*)    Total Bilirubin 2.2 (*)    Bilirubin, Direct 0.6 (*)    Indirect Bilirubin 1.6 (*)    All other components within normal limits  LACTIC ACID, PLASMA - Abnormal; Notable for the following components:   Lactic Acid, Venous 2.1 (*)    All other components within normal limits  CSF CELL COUNT WITH DIFFERENTIAL - Abnormal; Notable for the following components:   RBC Count, CSF 54 (*)    All other components within normal limits  CSF CELL COUNT WITH DIFFERENTIAL - Abnormal; Notable for the following  components:   RBC Count, CSF 8 (*)    All other components within normal limits  GLUCOSE, CSF - Abnormal; Notable for the following components:   Glucose, CSF 79 (*)    All other components within normal limits  SARS CORONAVIRUS 2 BY RT PCR (HOSPITAL ORDER, PERFORMED IN [facility])  CSF CULTURE  PROTEIN, CSF  LACTIC ACID, PLASMA                                                                                                                         EKG  EKG Interpretation  Date/Time:  Wednesday October 28 2020 22:45:42 EST Ventricular Rate:  110 PR Interval:  146 QRS Duration: 84 QT Interval:  304 QTC Calculation: 411 R Axis:   79 Text Interpretation: Sinus tachycardia Otherwise normal  ECG Confirmed by Drema Pry [phone]) on 10/29/2020 12:42:14 AM      Radiology DG Chest Port 1 View  Result Date: 10/29/2020 CLINICAL DATA:  Headache, fevers, neck pain and stiffness since Saturday. Negative COVID test. EXAM: PORTABLE CHEST 1 VIEW COMPARISON:  09/06/2009 FINDINGS: Shallow inspiration. Cardiac enlargement. Perihilar infiltrates suggesting edema or pneumonia. No pleural effusions. No pneumothorax. Mediastinal contours appear intact. IMPRESSION: Cardiac enlargement with perihilar infiltrates suggesting edema or pneumonia. Electronically Signed   By: Burman Nieves M.D.   On: 10/29/2020 02:01    Pertinent labs & imaging results that were available during my care of the patient were reviewed by me and considered in my medical decision making (see chart for details).  Medications Ordered in ED Medications  diphenhydrAMINE (BENADRYL) injection 25 mg (25 mg Intravenous Given 10/29/20 0024)  prochlorperazine (COMPAZINE) injection 10 mg (10 mg Intravenous Given 10/29/20 0024)  acetaminophen (TYLENOL) tablet 1,000 mg (1,000 mg Oral Given 10/29/20 0025)  morphine 4 MG/ML injection 6 mg (6 mg Intravenous Given 10/29/20 0229)  sodium chloride 0.9 % bolus 1,000 mL (0 mLs Intravenous  Stopped 10/29/20 0502)  amoxicillin-clavulanate (AUGMENTIN) 875-125 MG per tablet 1 tablet (1 tablet Oral Given 10/29/20 0552)  acetaminophen (TYLENOL) tablet 650 mg (650 mg Oral Given 10/29/20 0641)                                                                                                                                    Procedures .1-3 Lead EKG Interpretation Performed by: Nira Conn, MD Authorized by: Nira Conn, MD     Interpretation: normal     ECG rate:  89   ECG rate assessment: normal     Rhythm: sinus rhythm     Ectopy: none     Conduction: normal   .Lumbar Puncture  Date/Time: 10/29/2020 3:01 AM Performed by: Nira Conn, MD Authorized by: Nira Conn, MD   Consent:    Consent obtained:  Verbal   Consent given by:  Patient   Risks discussed:  Bleeding, infection, nerve damage, repeat procedure, pain and headache   Alternatives discussed:  Delayed treatment Universal protocol:    Procedure explained and questions answered to patient or proxy's satisfaction: yes     Immediately prior to procedure a time out was called: yes     Site/side marked: yes     Patient identity confirmed:  Verbally with patient and arm band Pre-procedure details:    Procedure purpose:  Diagnostic   Preparation: Patient was prepped and draped in usual sterile fashion   Anesthesia:    Anesthesia method:  Local infiltration   Local anesthetic:  Lidocaine 1% w/o epi Procedure details:    Lumbar space:  L4-L5 interspace   Patient position:  L lateral decubitus   Needle gauge:  18   Needle type:  Spinal needle - Quincke tip   Needle length (in):  5.0  Number of attempts:  2   Opening pressure (cm H2O):  26   Fluid appearance:  Blood-tinged and clear   Tubes of fluid:  4   Total volume (ml):  12 Post-procedure details:    Puncture site:  Adhesive bandage applied   Procedure completion:  Tolerated well, no immediate  complications    (including critical care time)  Medical Decision Making / ED Course I have reviewed the nursing notes for this encounter and the patient's prior records (if available in EHR or on provided paperwork).   Martha ClanMark L Turek was evaluated in Emergency Department on 10/29/2020 for the symptoms described in the history of present illness. He was evaluated in the context of the global COVID-19 pandemic, which necessitated consideration that the patient might be at risk for infection with the SARS-CoV-2 virus that causes COVID-19. Institutional protocols and algorithms that pertain to the evaluation of patients at risk for COVID-19 are in a state of rapid change based on information released by regulatory bodies including the CDC and federal and state organizations. These policies and algorithms were followed during the patient's care in the ED.  Here with 1 week of viral type symptoms including severe headache. Initial exam did not identify primary source of infection and patient has had 3 negative covid tests. Will LP to rule out bacterial meningitis.  CSF reassuring and not suspicious for bacterial or viral meningitis. Headache treated initially with migraine cocktail - no relief. Given 6 mg of morphine during LP and headache subsided. 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 Will refer to neurology for follow up.  CXR was notable for possible pneumonia. He has leukocytosis and initial lactic was elevated, but cleared. He is otherwise well appearing. Not septic. Appropriate for outpatient management with Augmentin.       Final Clinical Impression(s) / ED Diagnoses Final diagnoses:  Cough  Fever and chills  Nonintractable headache, unspecified chronicity pattern, unspecified headache type   The patient appears reasonably screened and/or stabilized for discharge and I doubt any other medical condition or other [facility] requiring further screening,  evaluation, or treatment in the ED at this time prior to discharge. Safe for discharge with strict return precautions.  Disposition: Discharge  Condition: Good  I have discussed the results, Dx and Tx plan with the patient/family who expressed understanding and agree(s) with the plan. Discharge instructions discussed at length. The patient/family was given strict return precautions who verbalized understanding of the instructions. No further questions at time of discharge.    ED Discharge Orders         Ordered    amoxicillin-clavulanate (AUGMENTIN) 875-125 MG tablet  Every 12 hours        10/29/20 0655    ondansetron (ZOFRAN ODT) 4 MG disintegrating tablet  Every 8 hours PRN        10/29/20 0655         Follow Up: Clovis RileyMitchell, L.August Saucerean, MD [address] [place] [id number] [phone]  Schedule an appointment as soon as possible for a visit    [facility] [address]     [address] [place] [phone] Schedule an appointment as soon as possible for a visit  to assess for possible idiopathic intracranial hypertension      This chart was dictated using voice recognition software.  Despite best efforts to proofread,  errors can occur which can change the documentation meaning.   Keishawna Carranza, Amadeo GarnetPedro Eduardo,  MD 10/29/20 0700

## 2020-10-29 NOTE — ED Notes (Addendum)
Date and time results received: 10/29/20 0105   Test: Lactic Acid Critical Value: 2.1  Name of Provider Notified: MD Cardama  Orders Received? NS bolus VRBO

## 2020-10-31 ENCOUNTER — Encounter (HOSPITAL_COMMUNITY): Payer: Self-pay | Admitting: Emergency Medicine

## 2020-10-31 ENCOUNTER — Emergency Department (HOSPITAL_COMMUNITY): Payer: 59

## 2020-10-31 ENCOUNTER — Inpatient Hospital Stay (HOSPITAL_COMMUNITY)
Admission: EM | Admit: 2020-10-31 | Discharge: 2020-11-02 | DRG: 194 | Disposition: A | Discharge status: Home / Self Care | Payer: 59 | Attending: Internal Medicine | Admitting: Internal Medicine

## 2020-10-31 ENCOUNTER — Other Ambulatory Visit: Payer: Self-pay

## 2020-10-31 DIAGNOSIS — Z823 Family history of stroke: Secondary | ICD-10-CM | POA: Diagnosis not present

## 2020-10-31 DIAGNOSIS — E6609 Other obesity due to excess calories: Secondary | ICD-10-CM | POA: Diagnosis not present

## 2020-10-31 DIAGNOSIS — U099 Post covid-19 condition, unspecified: Secondary | ICD-10-CM | POA: Diagnosis present

## 2020-10-31 DIAGNOSIS — E871 Hypo-osmolality and hyponatremia: Secondary | ICD-10-CM | POA: Diagnosis present

## 2020-10-31 DIAGNOSIS — G8929 Other chronic pain: Secondary | ICD-10-CM | POA: Diagnosis present

## 2020-10-31 DIAGNOSIS — J189 Pneumonia, unspecified organism: Secondary | ICD-10-CM | POA: Diagnosis present

## 2020-10-31 DIAGNOSIS — E669 Obesity, unspecified: Secondary | ICD-10-CM | POA: Diagnosis present

## 2020-10-31 DIAGNOSIS — I10 Essential (primary) hypertension: Secondary | ICD-10-CM | POA: Diagnosis present

## 2020-10-31 DIAGNOSIS — I319 Disease of pericardium, unspecified: Secondary | ICD-10-CM | POA: Diagnosis present

## 2020-10-31 DIAGNOSIS — R778 Other specified abnormalities of plasma proteins: Secondary | ICD-10-CM | POA: Diagnosis present

## 2020-10-31 DIAGNOSIS — Z20822 Contact with and (suspected) exposure to covid-19: Secondary | ICD-10-CM | POA: Diagnosis present

## 2020-10-31 DIAGNOSIS — G4733 Obstructive sleep apnea (adult) (pediatric): Secondary | ICD-10-CM | POA: Diagnosis present

## 2020-10-31 DIAGNOSIS — R079 Chest pain, unspecified: Secondary | ICD-10-CM | POA: Diagnosis not present

## 2020-10-31 DIAGNOSIS — Z833 Family history of diabetes mellitus: Secondary | ICD-10-CM

## 2020-10-31 DIAGNOSIS — Z83438 Family history of other disorder of lipoprotein metabolism and other lipidemia: Secondary | ICD-10-CM | POA: Diagnosis not present

## 2020-10-31 DIAGNOSIS — M109 Gout, unspecified: Secondary | ICD-10-CM | POA: Diagnosis present

## 2020-10-31 DIAGNOSIS — B3323 Viral pericarditis: Secondary | ICD-10-CM | POA: Diagnosis present

## 2020-10-31 DIAGNOSIS — Z6837 Body mass index (BMI) 37.0-37.9, adult: Secondary | ICD-10-CM | POA: Diagnosis not present

## 2020-10-31 DIAGNOSIS — E876 Hypokalemia: Secondary | ICD-10-CM | POA: Diagnosis present

## 2020-10-31 DIAGNOSIS — I301 Infective pericarditis: Secondary | ICD-10-CM | POA: Diagnosis present

## 2020-10-31 DIAGNOSIS — Z8249 Family history of ischemic heart disease and other diseases of the circulatory system: Secondary | ICD-10-CM

## 2020-10-31 DIAGNOSIS — M47812 Spondylosis without myelopathy or radiculopathy, cervical region: Secondary | ICD-10-CM | POA: Diagnosis present

## 2020-10-31 HISTORY — DX: Obesity, unspecified: E66.9

## 2020-10-31 HISTORY — DX: Obstructive sleep apnea (adult) (pediatric): G47.33

## 2020-10-31 HISTORY — DX: Disease of pericardium, unspecified: I31.9

## 2020-10-31 HISTORY — DX: Low back pain, unspecified: M54.50

## 2020-10-31 LAB — HIV ANTIBODY (ROUTINE TESTING W REFLEX): HIV Screen 4th Generation wRfx: NONREACTIVE

## 2020-10-31 LAB — BASIC METABOLIC PANEL
Anion gap: 11 (ref 5–15)
BUN: 14 mg/dL (ref 6–20)
CO2: 22 mmol/L (ref 22–32)
Calcium: 8.5 mg/dL — ABNORMAL LOW (ref 8.9–10.3)
Chloride: 102 mmol/L (ref 98–111)
Creatinine, Ser: 1.23 mg/dL (ref 0.61–1.24)
GFR, Estimated: >60 mL/min (ref >60)
Glucose, Bld: 175 mg/dL — ABNORMAL HIGH (ref 70–99)
Potassium: 3.3 mmol/L — ABNORMAL LOW (ref 3.5–5.1)
Sodium: 135 mmol/L (ref 135–145)

## 2020-10-31 LAB — TROPONIN I (HIGH SENSITIVITY)
Troponin I (High Sensitivity): 147 ng/L (ref <18)
Troponin I (High Sensitivity): 133 ng/L (ref <18)

## 2020-10-31 LAB — URINALYSIS, ROUTINE W REFLEX MICROSCOPIC
Bacteria, UA: NONE SEEN
Bilirubin Urine: NEGATIVE
Glucose, UA: NEGATIVE mg/dL
Hgb urine dipstick: NEGATIVE
Ketones, ur: NEGATIVE mg/dL
Leukocytes,Ua: NEGATIVE
Nitrite: NEGATIVE
Protein, ur: 30 mg/dL — AB
Specific Gravity, Urine: 1.026 (ref 1.005–1.030)
pH: 6 (ref 5.0–8.0)

## 2020-10-31 LAB — ECHOCARDIOGRAM COMPLETE
Area-P 1/2: 4.06 cm2
Height: 75 in
S' Lateral: 3.6 cm
Weight: 4736 oz

## 2020-10-31 LAB — C-REACTIVE PROTEIN: CRP: 40.1 mg/dL — ABNORMAL HIGH (ref <1.0)

## 2020-10-31 LAB — BRAIN NATRIURETIC PEPTIDE: B Natriuretic Peptide: 549 pg/mL — ABNORMAL HIGH (ref 0.0–100.0)

## 2020-10-31 LAB — CBC
HCT: 41.4 % (ref 39.0–52.0)
Hemoglobin: 13.6 g/dL (ref 13.0–17.0)
MCH: 29.2 pg (ref 26.0–34.0)
MCHC: 32.9 g/dL (ref 30.0–36.0)
MCV: 88.8 fL (ref 80.0–100.0)
Platelets: 192 10*3/uL (ref 150–400)
RBC: 4.66 MIL/uL (ref 4.22–5.81)
RDW: 13.6 % (ref 11.5–15.5)
WBC: 14 10*3/uL — ABNORMAL HIGH (ref 4.0–10.5)
nRBC: 0 % (ref 0.0–0.2)

## 2020-10-31 LAB — HEPATIC FUNCTION PANEL
ALT: 39 U/L (ref 0–44)
AST: 34 U/L (ref 15–41)
Albumin: 2.6 g/dL — ABNORMAL LOW (ref 3.5–5.0)
Alkaline Phosphatase: 75 U/L (ref 38–126)
Bilirubin, Direct: 0.4 mg/dL — ABNORMAL HIGH (ref 0.0–0.2)
Indirect Bilirubin: 0.7 mg/dL (ref 0.3–0.9)
Total Bilirubin: 1.1 mg/dL (ref 0.3–1.2)
Total Protein: 6.9 g/dL (ref 6.5–8.1)

## 2020-10-31 LAB — SEDIMENTATION RATE: Sed Rate: 98 mm/hr — ABNORMAL HIGH (ref 0–16)

## 2020-10-31 LAB — PROCALCITONIN: Procalcitonin: 4.53 ng/mL

## 2020-10-31 LAB — CBG MONITORING, ED: Glucose-Capillary: 166 mg/dL — ABNORMAL HIGH (ref 70–99)

## 2020-10-31 LAB — MAGNESIUM: Magnesium: 2.5 mg/dL — ABNORMAL HIGH (ref 1.7–2.4)

## 2020-10-31 LAB — CK: Total CK: 118 U/L (ref 49–397)

## 2020-10-31 LAB — LACTIC ACID, PLASMA: Lactic Acid, Venous: 1.7 mmol/L (ref 0.5–1.9)

## 2020-10-31 LAB — SARS CORONAVIRUS 2 BY RT PCR (HOSPITAL ORDER, PERFORMED IN ~~LOC~~ HOSPITAL LAB): SARS Coronavirus 2: NEGATIVE

## 2020-10-31 MED ORDER — SODIUM CHLORIDE 0.9 % IV SOLN
2.0000 g | INTRAVENOUS | Status: DC
  Administered 2020-11-01 – 2020-11-02 (×2): 2 g via INTRAVENOUS
  Filled 2020-10-31 (×2): qty 20

## 2020-10-31 MED ORDER — GUAIFENESIN 100 MG/5ML PO SOLN
5.0000 mL | ORAL | Status: DC | PRN
  Filled 2020-10-31: qty 5

## 2020-10-31 MED ORDER — COLCHICINE 0.6 MG PO TABS
0.6000 mg | ORAL_TABLET | Freq: Two times a day (BID) | ORAL | Status: DC
  Administered 2020-10-31 – 2020-11-02 (×5): 0.6 mg via ORAL
  Filled 2020-10-31 (×5): qty 1

## 2020-10-31 MED ORDER — ASPIRIN 81 MG PO CHEW
324.0000 mg | CHEWABLE_TABLET | Freq: Once | ORAL | Status: AC
  Administered 2020-10-31: 324 mg via ORAL
  Filled 2020-10-31: qty 4

## 2020-10-31 MED ORDER — QUETIAPINE FUMARATE 25 MG PO TABS
25.0000 mg | ORAL_TABLET | Freq: Every day | ORAL | Status: DC
Start: 2020-11-01 — End: 2020-11-02
  Administered 2020-11-01 – 2020-11-02 (×2): 25 mg via ORAL
  Filled 2020-10-31 (×2): qty 1

## 2020-10-31 MED ORDER — SODIUM CHLORIDE 0.9 % IV SOLN: INTRAVENOUS | Status: DC

## 2020-10-31 MED ORDER — SODIUM CHLORIDE 0.9 % IV SOLN
1.0000 g | Freq: Once | INTRAVENOUS | Status: AC
  Administered 2020-10-31: 1 g via INTRAVENOUS
  Filled 2020-10-31: qty 10

## 2020-10-31 MED ORDER — SODIUM CHLORIDE 0.9 % IV BOLUS
1000.0000 mL | Freq: Once | INTRAVENOUS | Status: AC
  Administered 2020-10-31: 1000 mL via INTRAVENOUS

## 2020-10-31 MED ORDER — ONDANSETRON HCL 4 MG/2ML IJ SOLN: 4.0000 mg | Freq: Four times a day (QID) | INTRAMUSCULAR | Status: DC | PRN

## 2020-10-31 MED ORDER — POTASSIUM CHLORIDE CRYS ER 20 MEQ PO TBCR
40.0000 meq | EXTENDED_RELEASE_TABLET | Freq: Once | ORAL | Status: AC
  Administered 2020-10-31: 40 meq via ORAL
  Filled 2020-10-31: qty 2

## 2020-10-31 MED ORDER — ALLOPURINOL 100 MG PO TABS
100.0000 mg | ORAL_TABLET | Freq: Every day | ORAL | Status: DC
  Administered 2020-11-01 – 2020-11-02 (×2): 100 mg via ORAL
  Filled 2020-10-31 (×2): qty 1

## 2020-10-31 MED ORDER — COLCHICINE 0.6 MG PO TABS: 0.6000 mg | ORAL_TABLET | Freq: Every day | ORAL | Status: DC

## 2020-10-31 MED ORDER — ACETAMINOPHEN 325 MG PO TABS
650.0000 mg | ORAL_TABLET | Freq: Four times a day (QID) | ORAL | Status: DC | PRN
  Administered 2020-10-31 – 2020-11-01 (×4): 650 mg via ORAL
  Filled 2020-10-31 (×4): qty 2

## 2020-10-31 MED ORDER — SODIUM CHLORIDE 0.9 % IV SOLN
500.0000 mg | Freq: Once | INTRAVENOUS | Status: AC
  Administered 2020-10-31: 500 mg via INTRAVENOUS
  Filled 2020-10-31: qty 500

## 2020-10-31 MED ORDER — CYCLOBENZAPRINE HCL 10 MG PO TABS: 5.0000 mg | ORAL_TABLET | Freq: Three times a day (TID) | ORAL | Status: DC | PRN

## 2020-10-31 MED ORDER — ENOXAPARIN SODIUM 80 MG/0.8ML ~~LOC~~ SOLN
65.0000 mg | SUBCUTANEOUS | Status: DC
  Administered 2020-10-31 – 2020-11-02 (×3): 65 mg via SUBCUTANEOUS
  Filled 2020-10-31: qty 0.65
  Filled 2020-10-31 (×2): qty 0.8

## 2020-10-31 MED ORDER — IOHEXOL 350 MG/ML SOLN
100.0000 mL | Freq: Once | INTRAVENOUS | Status: AC | PRN
  Administered 2020-10-31: 100 mL via INTRAVENOUS

## 2020-10-31 MED ORDER — SODIUM CHLORIDE 0.9 % IV SOLN
500.0000 mg | INTRAVENOUS | Status: DC
  Administered 2020-11-01 – 2020-11-02 (×2): 500 mg via INTRAVENOUS
  Filled 2020-10-31 (×2): qty 500

## 2020-10-31 NOTE — ED Provider Notes (Signed)
[facility] EMERGENCY DEPARTMENT Provider Note   CSN: [id number] Arrival date & time: 10/31/20  0857     History Chief Complaint  Patient presents with  . Chest Pain  . Shortness of Breath    Nathaniel Jones is a 40 y.o. male with PMHx HTN who presents to the ED today with complaint of sudden onset, constant, sharp, pleuritic in nature left sided chest pain that began on 1/27. Pt reports that for the past week he has been having fevers, chills, mild cough, headache, and body aches. He was seen twice at [facility] for same with 2 negative COVID test. He was seen in the ED on 1/27 for same and had a lumbar puncture done with concern for possible bacterial meningitis - workup reassuring however CXR did show concern for possible PNA. Pt was discharged with Augmentin. He reports that immediately after the LP he began having left sided chest pain. The pain is worse when laying down at night to sleep however also worse when he takes a deep breath/yawns. He feels like his headache has improved since being on the antibiotic. He does mention he has been having intermittent episodes of sweating as well for the past week which have been unchanged on the antibiotics. Pt denies any recent sick contacts. No hx DVT/PE. He does mention having pericarditis when he was in his 20's and this feels slightly similar however worse.   The history is provided by the patient and medical records.    HPI: A 40 year old patient with a history of hypertension and obesity presents for evaluation of chest pain. Initial onset of pain was less than one hour ago. The patient's chest pain is described as heaviness/pressure/tightness and is not worse with exertion. The patient complains of nausea and reports some diaphoresis. The patient's chest pain is middle- or left-sided, is not well-localized, is not sharp and does not radiate to the arms/jaw/neck. The patient has no history of stroke, has no history of peripheral artery  disease, has not smoked in the past 90 days, denies any history of treated diabetes, has no relevant family history of coronary artery disease (first degree relative at less than age 62) and has no history of hypercholesterolemia.   Past Medical History:  Diagnosis Date  . Hypertension   . Low back pain    herniated disc  . Obesity   . OSA (obstructive sleep apnea)    has not yet received CPAP  . Pericarditis    in his 65s    Patient Active Problem List   Diagnosis Date Noted  . CAP (community acquired pneumonia) 10/31/2020  . Sleep disturbance 10/15/2020  . Rectal bleeding 10/15/2020  . Primary generalized (osteo)arthritis 10/15/2020  . Prediabetes 10/15/2020  . Obesity 10/15/2020  . Neck pain 10/15/2020  . Muscle contraction headache 10/15/2020  . Decreased testosterone level 10/15/2020  . Suspected COVID-19 virus infection 12/24/2019  . Unilateral primary osteoarthritis, left knee 05/17/2018  . Unilateral primary osteoarthritis, right knee 05/17/2018  . Chronic pain of both knees 05/17/2018  . Biceps rupture, proximal, right, initial encounter 05/22/2017  . Chronic bilateral low back pain without sciatica 03/08/2017  . Lateral epicondylitis, right elbow 03/08/2017  . It band syndrome, left 03/08/2017    Past Surgical History:  Procedure Laterality Date  . KNEE SURGERY Right        Family History  Problem Relation Age of Onset  . Hypertension Other   . Diabetes Other   . Hypertension Mother   .  Diabetes Mother   . Hypertension Father   . Heart disease Father        Unclear details  . Diabetes Brother   . Stroke Maternal Grandmother   . Diabetes Maternal Grandmother   . High Cholesterol Maternal Grandmother   . Stroke Maternal Grandfather   . Autoimmune disease Neg Hx     Social History   Tobacco Use  . Smoking status: Never Smoker  . Smokeless tobacco: Never Used  Substance Use Topics  . Alcohol use: No  . Drug use: No    Home Medications Prior  to Admission medications   Medication Sig Start Date End Date Taking? Authorizing Provider  allopurinol (ZYLOPRIM) 100 MG tablet 1 tablet 06/05/20   [provider]  amoxicillin-clavulanate (AUGMENTIN) 875-125 MG tablet Take 1 tablet by mouth every 12 (twelve) hours. 10/29/20   Cardama, Amadeo Garnet, MD  Biotin 1000 MCG tablet 1 tablet    [provider]  colchicine 0.6 MG tablet 1 tablet    [provider]  Cranberry 300 MG tablet See admin instructions.    [provider]  cyclobenzaprine (FLEXERIL) 5 MG tablet Take 1 tablet (5 mg total) by mouth 3 (three) times daily as needed. 09/10/20   Kirtland Bouchard, PA-C  Ferrous Sulfate (IRON) 325 (65 Fe) MG TABS 1 tablet    [provider]  Glucosamine-Chondroitin 500-400 MG CAPS 1 capsule with a meal    [provider]  linaclotide (LINZESS) 145 MCG CAPS capsule 1 capsule    [provider]  Lysine 500 MG CAPS See admin instructions.    [provider]  Misc Natural Products (TART CHERRY ADVANCED) CAPS 1 capsule    [provider]  Multiple Vitamins-Minerals (ONE-A-DAY MENS HEALTH FORMULA) TABS See admin instructions.    [provider]  nabumetone (RELAFEN) 750 MG tablet Take 1 tablet (750 mg total) by mouth 2 (two) times daily as needed. 09/10/20   Kirtland Bouchard, PA-C  Omega-3 Fatty Acids (FISH OIL) 1000 MG CAPS 1 capsule    [provider]  ondansetron (ZOFRAN ODT) 4 MG disintegrating tablet Take 1 tablet (4 mg total) by mouth every 8 (eight) hours as needed for up to 3 days for nausea or vomiting. 10/29/20 11/01/20  Cardama, Amadeo Garnet, MD  pyridOXINE (VITAMIN B-6) 100 MG tablet 1 tablet    [provider]  QUEtiapine (SEROQUEL) 25 MG tablet Take 25 mg by mouth daily.    [provider]  Saw Palmetto, Serenoa repens, (SAW PALMETTO EXTRACT) 160 MG CAPS See admin instructions.    [provider]  sildenafil (VIAGRA) 100 MG  tablet Take 100 mg by mouth daily as needed. 05/13/20   [provider]  Specialty Vitamins Products (COLLAGEN ULTRA) CAPS See admin instructions.    [provider]  Turmeric 500 MG TABS See admin instructions.    [provider]  vitamin B-12 (CYANOCOBALAMIN) 1000 MCG tablet 1 tablet    [provider]    Allergies    Coconut oil and Ibuprofen  Review of Systems   Review of Systems  Constitutional: Positive for chills, diaphoresis, fatigue and fever.  Respiratory: Positive for cough and shortness of breath.   Cardiovascular: Positive for chest pain.  Musculoskeletal: Positive for myalgias.  Neurological: Positive for headaches (improved).  All other systems reviewed and are negative.   Physical Exam Updated Vital Signs BP 119/72 (BP Location: Right Arm)   Pulse 93   Temp 98.7 F (  37.1 C) (Oral)   Resp (!) 27   Ht 6\' 3"  (1.905 m)   Wt 134.3 kg   SpO2 97%   BMI 37.00 kg/m   Physical Exam Vitals and nursing note reviewed.  Constitutional:      Appearance: He is diaphoretic. He is not ill-appearing.     Comments: Sweating profusely from forehead  HENT:     Head: Normocephalic and atraumatic.  Eyes:     Conjunctiva/sclera: Conjunctivae normal.  Cardiovascular:     Rate and Rhythm: Normal rate and regular rhythm.     Pulses:          Radial pulses are 2+ on the right side and 2+ on the left side.       Dorsalis pedis pulses are 2+ on the right side and 2+ on the left side.     Heart sounds: Normal heart sounds.  Pulmonary:     Effort: Pulmonary effort is normal.     Breath sounds: Normal breath sounds. No decreased breath sounds, wheezing, rhonchi or rales.  Chest:     Chest wall: Tenderness present.  Abdominal:     Palpations: Abdomen is soft.     Tenderness: There is no abdominal tenderness. There is no guarding or rebound.  Musculoskeletal:     Cervical back: Neck supple.     Right lower leg: No edema.     Left lower leg: No  edema.  Skin:    General: Skin is warm.  Neurological:     Mental Status: He is alert.     ED Results / Procedures / Treatments   Labs (all labs ordered are listed, but only abnormal results are displayed) Labs Reviewed  BASIC METABOLIC PANEL - Abnormal; Notable for the following components:      Result Value   Potassium 3.3 (*)    Glucose, Bld 175 (*)    Calcium 8.5 (*)    All other components within normal limits  CBC - Abnormal; Notable for the following components:   WBC 14.0 (*)    All other components within normal limits  BRAIN NATRIURETIC PEPTIDE - Abnormal; Notable for the following components:   B Natriuretic Peptide 549.0 (*)    All other components within normal limits  CBG MONITORING, ED - Abnormal; Notable for the following components:   Glucose-Capillary 166 (*)    All other components within normal limits  TROPONIN I (HIGH SENSITIVITY) - Abnormal; Notable for the following components:   Troponin I (High Sensitivity) 147 (*)    All other components within normal limits  SARS CORONAVIRUS 2 BY RT PCR (HOSPITAL ORDER, PERFORMED IN [place] [facility])  CULTURE, BLOOD (ROUTINE X 2)  CULTURE, BLOOD (ROUTINE X 2)  EXPECTORATED SPUTUM ASSESSMENT W REFEX TO RESP CULTURE  RESPIRATORY PANEL BY PCR  LACTIC ACID, PLASMA  URINALYSIS, ROUTINE W REFLEX MICROSCOPIC  SEDIMENTATION RATE  C-REACTIVE PROTEIN  HEPATIC FUNCTION PANEL  CK  MAGNESIUM  HIV ANTIBODY (ROUTINE TESTING W REFLEX)  STREP PNEUMONIAE URINARY ANTIGEN  TROPONIN I (HIGH SENSITIVITY)    EKG EKG Interpretation  Date/Time:  Saturday October 31 2020 10:38:44 EST Ventricular Rate:  93 PR Interval:  158 QRS Duration: 103 QT Interval:  363 QTC Calculation: 452 R Axis:   70 Text Interpretation: Sinus rhythm Atrial premature complex Consider left atrial enlargement Abnormal lateral Q waves Borderline ST elevation, anterior leads slightly more pronounced than prior ecg today Confirmed by 1973 [phone]) on 10/31/2020 10:55:21 AM  Radiology DG Chest 2 View  Result Date: 10/31/2020 CLINICAL DATA:  Left chest pain, shortness of breath and diaphoresis. EXAM: CHEST - 2 VIEW COMPARISON:  10/29/2020 FINDINGS: Improved inspiration. Normal sized heart. Decreased inferior right perihilar airspace opacity. Resolved left perihilar airspace opacity. Decreased prominence of the interstitial markings. No pleural fluid. Unremarkable bones. IMPRESSION: Improved inspiration with decreased right perihilar and resolved left perihilar atelectasis, pneumonia or edema. Electronically Signed   By: Beckie Salts M.D.   On: 10/31/2020 09:54   CT Angio Chest PE W/Cm &/Or Wo Cm  Result Date: 10/31/2020 CLINICAL DATA:  PE suspected. High probability. Sharp LEFT-sided chest pain. EXAM: CT ANGIOGRAPHY CHEST WITH CONTRAST TECHNIQUE: Multidetector CT imaging of the chest was performed using the standard protocol during bolus administration of intravenous contrast. Multiplanar CT image reconstructions and MIPs were obtained to evaluate the vascular anatomy. CONTRAST:  OMNIPAQUE IOHEXOL 350 MG/ML SOLN COMPARISON:  Chest radiograph 10/31/2020 FINDINGS: Cardiovascular: No pulmonary embolism within the proximal main pulmonary arteries. There is a mottled appearance of the lower lobe pulmonary arteries. No clear evidence of continuous tubular defects. Cannot exclude small pulmonary emboli within the lower lobe pulmonary arteries. Exam degraded by patient body habitus. Mediastinum/Nodes: No axillary or supraclavicular adenopathy. No mediastinal or hilar adenopathy. No pericardial fluid. Esophagus normal. Lungs/Pleura: Rounded consolidation in the superior segment of the RIGHT lower lobe adjacent to the hilum measures 3.1 by 3.1 cm (image 71/7). Mild segmental ground-glass densities distal to this consolidation. Upper Abdomen: Limited view of the liver, kidneys, pancreas are unremarkable. Normal adrenal glands.  Musculoskeletal: No aggressive osseous lesion. Review of the MIP images confirms the above findings. IMPRESSION: 1. No convincing evidence of acute pulmonary embolism. Mottled densities in lower lobe pulmonary arteries favored related to patient body habitus. Cannot exclude small distal pulmonary emboli in lower lobes. 2. Rounded consolidation in the superior segment RIGHT lower lobe is favored focus of pneumonia. Recommend follow-up CT after appropriate antibiotic therapy to ensure resolution Electronically Signed   By: Genevive Bi M.D.   On: 10/31/2020 10:36    Procedures Procedures   Medications Ordered in ED Medications  potassium chloride SA (KLOR-CON) CR tablet 40 mEq (has no administration in time range)  cefTRIAXone (ROCEPHIN) 1 g in sodium chloride 0.9 % 100 mL IVPB (has no administration in time range)  azithromycin (ZITHROMAX) 500 mg in sodium chloride 0.9 % 250 mL IVPB (has no administration in time range)  colchicine tablet 0.6 mg (has no administration in time range)  cyclobenzaprine (FLEXERIL) tablet 5 mg (has no administration in time range)  QUEtiapine (SEROQUEL) tablet 25 mg (has no administration in time range)  allopurinol (ZYLOPRIM) tablet 100 mg (has no administration in time range)  enoxaparin (LOVENOX) injection 65 mg (has no administration in time range)  0.9 %  sodium chloride infusion (has no administration in time range)  cefTRIAXone (ROCEPHIN) 2 g in sodium chloride 0.9 % 100 mL IVPB (has no administration in time range)  azithromycin (ZITHROMAX) 500 mg in sodium chloride 0.9 % 250 mL IVPB (has no administration in time range)  guaiFENesin (ROBITUSSIN) 100 MG/5ML solution 100 mg (has no administration in time range)  acetaminophen (TYLENOL) tablet 650 mg (has no administration in time range)  ondansetron (ZOFRAN) injection 4 mg (has no administration in time range)  sodium chloride 0.9 % bolus 1,000 mL (0 mLs Intravenous Stopped 10/31/20 1219)  iohexol  (OMNIPAQUE) 350 MG/ML injection 100 mL (100 mLs Intravenous Contrast Given 10/31/20 1018)  aspirin chewable tablet  324 mg (324 mg Oral Given 10/31/20 1103)    ED Course  I have reviewed the triage vital signs and the nursing notes.  Pertinent labs & imaging results that were available during my care of the patient were reviewed by me and considered in my medical decision making (see chart for details).  Clinical Course as of 10/31/20 1331  Sat Oct 31, 2020  1000 WBC(!): 14.0 [MV]  1038 Discussed case with cardiology regarding elevated troponin - Dr.Camnitz to come evaluate patient [MV]  [id number] 40 year old male with some pleuritic left-sided chest pain.  Saturations okay.  States he had pericarditis when he was 46s.  Similar pain.  EKG showing someelevation in V2 Troponin mildly elevated will need to be trended.  Cards consult pending.  Disposition per results of cardiology evaluation. [MB]    Clinical Course User Index [MB] Terrilee Files, MD [MV] Tanda Rockers, PA-C   MDM Rules/Calculators/A&P HEAR Score: 75                        40 year old male presents to the ED today complaining of persistent chest pain pleuritic in nature as well as shortness of breath since having lumbar puncture done on 1/27.  Has been having a febrile illness for the past week with all 3 - Covid test and a reassuring LP work-up, diagnosed with pneumonia during his ED visit on 1/27 prescribed Augmentin in the outpatient setting.  Arrival to the ED patient is afebrile however very mildly tachycardic at 100.  Was called to the room by nursing staff this patient was significantly diaphoretic.  He reports that this has been an ongoing issue for the past week with his fevers.  He describes the pain is pleuritic in nature however also worse when lying down.  There is concern for possible PE at this time.  He does also mention history of pericarditis when he was in his 71s and states this feels somewhat similar.  Will  work-up with EKG, chest x-ray, CBC, BMP, troponin and plan for CTA.  EKG obtained, shows mild elevation in V2 however not in remaining leads.  Attending physician Dr. Charm Barges has evaluated EKG; trop pending Chest x-ray obtained, and improving edema versus infection.  Given edema will add on a BNP at this time.  Troponin has returned elevated at 147.  Have discussed case with Dr. Elberta Fortis with cardiology who will come evaluate patient.  CTA pending.  Usual EKG obtained shows does show a more consistent elevations in additional leads.   CBC with a increasing leukocytosis of 14,000.  Lactic acid normal at 1.7.  BMP with a potassium of 3.3.  CTA negative for PE however does show rounded consolidation in the RLL consistent with PNA. Awaiting cardiology recommendations at this time.  He has been given aspirin.   Dr. Elberta Fortis and Ronie Spies, PA-C have evaluated patient - please see their consult note. Suspect pericarditis at this time and recommend observation and medicine admission. Will consult medicine at this time; abx ordered for PNA.   Dr. Ophelia [facility] to admit patient.   This note was prepared using Dragon voice recognition software and may include unintentional dictation errors due to the inherent limitations of voice recognition software.  Final Clinical Impression(s) / ED Diagnoses Final diagnoses:  Acute viral pericarditis  Community acquired pneumonia of right lower lobe of lung    Rx / DC Orders ED Discharge Orders    None  Tanda RockersVenter, Jimmie Dattilio, PA-C 10/31/20 1331    Terrilee FilesButler, Michael C, MD 10/31/20 [id number]

## 2020-10-31 NOTE — ED Notes (Signed)
This RN tried to call report, showed that bed was ready but floor said that they had not approved it yet. [place] Spaniel they will call back.

## 2020-10-31 NOTE — ED Notes (Signed)
Pt back from CT

## 2020-10-31 NOTE — ED Notes (Signed)
Patient transported to CT 

## 2020-10-31 NOTE — ED Triage Notes (Signed)
Pt states he was seen in ED on 1/26 and had LP.  Reports sharp L sided chest pain since being turned over from his side to back after procedure.  Also reports SOB and sweating that last approx 1 1/2 hrs each time he takes antibiotics.

## 2020-10-31 NOTE — Consult Note (Addendum)
Cardiology Consultation:   Patient ID: Nathaniel Jones MRN: [id number]; DOB: [birth year]  Admit date: 10/31/2020 Date of Consult: 10/31/2020  Primary Care Provider: [name], [name], MD [facility] Cardiologist: Nicki Guadalajara, MD  [facility] Electrophysiologist:  None    Patient Profile:   Nathaniel Jones is a 40 y.o. male with a hx of remote pericarditis in his 64s, HTN, obesity, OSA, prior back problems who is being seen today for the evaluation of chest pain at the request of Dr. Charm Barges.  History of Present Illness:   Nathaniel Jones has followed with Dr. Tresa Endo in the past for his OSA. He is a former Therapist, occupational. He reports a prior remote hx of pericarditis in his 27s. He also had remote testing for shortness of breath in the context of weight gain that was reassuring. Nuclear stress test 08/2017 showed normal perfusion, EF 51% but visually appeared normal (achieved 13.7 METS). 2D Echo at that time showed EF 55% with moderate LVH, mild LAE, mild TR.  Per review of CareEverywhere, he tested positive for Covid antigen 10/02/20 in the context of fever and cough. However, he states that that was much more mild compared to what he's been feeling recently.   He was recently evaluated in urgent care 1/23 for fever, headache, chills, malaise, and fever. Covid was negative. Flu swab appears to have been intended but listed as discontinued. He had GI upset with ibuprofen, which he reports happens when he takes a lot of this . Returned to [facility] 1/25 for ongoing symptoms, still febrile, tachycardic and hypertensive - received Toradol with recommendation for supportive care as repeat Covid was negative. He presented back to the ER the evening of 1/27 with diaphoresis, ongoing fever, chills, severe headache, neck pain/stiffness, myalgias, loose stools, dry cough and nausea/vomiting. Covid again negative. Labs showed lactic acidosis, hyponatremia and abnormal LFTs. LP was done to exclude meningitis and CSF  was felt to be reassuring. Headache improved with morphine and outpatient neuro follow-up recommended. CXR was notable for possible PNA so also started on Augmentin.   Unfortunately he's continued to feel poorly since that time with the interim development of chest pressure specifically with recumbency, yawning and inspiration. He feels better if he is sitting upright. He also feels dyspnea on exertion and profound fatigue. He still has no appetite. He's not been sleeping well because he continues to have cold sweats especially with recumbency. He has a mild cough. He has not had any recent fever since starting Augmentin.  Labs show hypokalemia of 3.3, hsTroponin 147, lactic acid wnl, increased leukocytosis of 14, normal Hgb. CXR shows improved inspiration with decreased right perihilar and resolved left perihilar atelectasis, pneumonia or edema. CT angio shows no convincing acute PE; mottled densities in lower lobe pulmonary arteries favored related to patient body habitus, cannot exclude small distal pulmonary emboli in lower lobes, rounded consolidation in the superior segment right lower lobe is favored focus of pneumonia, recommend follow-up CT after appropriate antibiotic therapy to ensure resolution. VSS, HR upper limits of normal. Formal med rec pending.  Past Medical History:  Diagnosis Date  . Hypertension   . Obesity   . OSA (obstructive sleep apnea)     Past Surgical History:  Procedure Laterality Date  . KNEE SURGERY Right      Home Medications:  Prior to Admission medications   Medication Sig Start Date End Date Taking? Authorizing Provider  allopurinol (ZYLOPRIM) 100 MG tablet 1 tablet 06/05/20   [provider]  amoxicillin-clavulanate (AUGMENTIN) 875-125 MG tablet Take 1 tablet by mouth every 12 (twelve) hours. 10/29/20   Cardama, Amadeo GarnetPedro Eduardo, MD  Biotin 1000 MCG tablet 1 tablet    [provider]  colchicine 0.6 MG tablet 1 tablet    [provider]   Cranberry 300 MG tablet See admin instructions.    [provider]  cyclobenzaprine (FLEXERIL) 5 MG tablet Take 1 tablet (5 mg total) by mouth 3 (three) times daily as needed. 09/10/20   Kirtland Bouchardlark, Gilbert W, PA-C  Ferrous Sulfate (IRON) 325 (65 Fe) MG TABS 1 tablet    [provider]  Glucosamine-Chondroitin 500-400 MG CAPS 1 capsule with a meal    [provider]  linaclotide (LINZESS) 145 MCG CAPS capsule 1 capsule    [provider]  Lysine 500 MG CAPS See admin instructions.    [provider]  Misc Natural Products (TART CHERRY ADVANCED) CAPS 1 capsule    [provider]  Multiple Vitamins-Minerals (ONE-A-DAY MENS HEALTH FORMULA) TABS See admin instructions.    [provider]  nabumetone (RELAFEN) 750 MG tablet Take 1 tablet (750 mg total) by mouth 2 (two) times daily as needed. 09/10/20   Kirtland Bouchardlark, Gilbert W, PA-C  Omega-3 Fatty Acids (FISH OIL) 1000 MG CAPS 1 capsule    [provider]  ondansetron (ZOFRAN ODT) 4 MG disintegrating tablet Take 1 tablet (4 mg total) by mouth every 8 (eight) hours as needed for up to 3 days for nausea or vomiting. 10/29/20 11/01/20  Cardama, Amadeo GarnetPedro Eduardo, MD  pyridOXINE (VITAMIN B-6) 100 MG tablet 1 tablet    [provider]  QUEtiapine (SEROQUEL) 25 MG tablet Take 25 mg by mouth daily.    [provider]  Saw Palmetto, Serenoa repens, (SAW PALMETTO EXTRACT) 160 MG CAPS See admin instructions.    [provider]  sildenafil (VIAGRA) 100 MG tablet Take 100 mg by mouth daily as needed. 05/13/20   [provider]  Specialty Vitamins Products (COLLAGEN ULTRA) CAPS See admin instructions.    [provider]  Turmeric 500 MG TABS See admin instructions.    [provider]  vitamin B-12 (CYANOCOBALAMIN) 1000 MCG tablet 1 tablet    [provider]    Inpatient Medications: Scheduled Meds: . colchicine  0.6 mg Oral Daily  . potassium  chloride  40 mEq Oral Once   Continuous Infusions: . azithromycin    . cefTRIAXone (ROCEPHIN)  IV     PRN Meds:   Allergies:    Allergies  Allergen Reactions  . Coconut Oil Other (See Comments)  . Ibuprofen     Stomach irritation    Social History:   Social History   Socioeconomic History  . Marital status: Single    Spouse name: Not on file  . Number of children: 0  . Years of education: Not on file  . Highest education level: Not on file  Occupational History  . Occupation: recreation Runner, broadcasting/film/videocener    Employer: UNEMPLOYED  Tobacco Use  . Smoking status: Never Smoker  . Smokeless tobacco: Never Used  Substance and Sexual Activity  . Alcohol use: No  . Drug use: No  . Sexual activity: Not on file  Other Topics Concern  . Not on file  Social History Narrative  . Not on file   Social Determinants of Health   Financial Resource Strain: Not on file  Food Insecurity: Not on file  Transportation Needs: Not on file  Physical Activity: Not  on file  Stress: Not on file  Social Connections: Not on file  Intimate Partner Violence: Not on file    Family History:   Family History  Problem Relation Age of Onset  . Hypertension Other   . Diabetes Other   . Hypertension Mother   . Diabetes Mother   . Hypertension Father   . Heart disease Father        Unclear details  . Diabetes Brother   . Stroke Maternal Grandmother   . Diabetes Maternal Grandmother   . High Cholesterol Maternal Grandmother   . Stroke Maternal Grandfather      ROS:  Please see the history of present illness.  All other ROS reviewed and negative.     Physical Exam/Data:   Vitals:   10/31/20 0958 10/31/20 1001 10/31/20 1105 10/31/20 1219  BP:  119/72 118/78 117/75  Pulse:  93 88 88  Resp:  (!) 27 (!) 21 (!) 23  Temp:      TempSrc:      SpO2:  97% 99% 99%  Weight: 134.3 kg     Height: 6\' 3"  (1.905 m)       Intake/Output Summary (Last 24 hours) at 10/31/2020 1238 Last data filed at  10/31/2020 1219 Gross per 24 hour  Intake 1000 ml  Output -  Net 1000 ml   Last 3 Weights 10/31/2020 10/27/2020 07/14/2020  Weight (lbs) 296 lb 307 lb 315 lb  Weight (kg) 134.265 kg 139.254 kg 142.883 kg     Body mass index is 37 kg/m.  Examined by MD General: Well developed, well nourished AAM in no acute distress. Head: Normocephalic, atraumatic, sclera non-icteric, no xanthomas, nares are without discharge. Neck: Negative for carotid bruits. JVP not elevated. Lungs: Clear bilaterally to auscultation without wheezes, rales, or rhonchi. Breathing is unlabored. Heart: RRR S1 S2 without murmurs, rubs, or gallops.  Abdomen: Soft, non-tender, non-distended with normoactive bowel sounds. No rebound/guarding. Extremities: No clubbing or cyanosis. No edema. Distal pedal pulses are 2+ and equal bilaterally. Neuro: Alert and oriented X 3. Moves all extremities spontaneously. Psych:  Responds to questions appropriately with a normal affect.   EKG:  The EKG was personally reviewed and demonstrates:  All tracings reviewed Last EKG showed sinust ach 102bpm, with subtle inferior ST elevation and rounded ST segments V2-V3 -does not meet criteria for STEMI. PACs noted. Other EKGs reviewed recently show nonspecific changes Telemetry:  Telemetry was personally reviewed and demonstrates: NSR  Relevant CV Studies: 2D Echo 08/2017 - Left ventricle: The cavity size was normal. There was moderate  concentric hypertrophy. Systolic function was normal. The  estimated ejection fraction was 55%. Wall motion was normal;  there were no regional wall motion abnormalities. Left  ventricular diastolic function parameters were normal.  - Aortic valve: Trileaflet; normal thickness, mildly calcified  leaflets. There was trivial regurgitation.  - Left atrium: The atrium was mildly dilated.  - Right ventricle: Systolic function was normal.  - Tricuspid valve: There was mild regurgitation.  - Pulmonic  valve: There was no regurgitation.  - Pulmonary arteries: Systolic pressure was within the normal  range.  - Inferior vena cava: The vessel was normal in size.  - Pericardium, extracardiac: There was no pericardial effusion.   Nuc 08/2017  Nuclear stress EF: 51%.  The left ventricular ejection fraction is mildly decreased (45-54%).  Blood pressure demonstrated a normal response to exercise.  There was no ST segment deviation noted during stress.  The study is normal.  This is a low risk study.   Normal resting and stress perfusion. No ischemia or infarction EF Estimated at 51% but appears normal       Laboratory Data:  High Sensitivity Troponin:   Recent Labs  Lab 10/31/20 0926  TROPONINIHS 147*     Chemistry Recent Labs  Lab 10/28/20 2245 10/31/20 0926  NA 132* 135  K 3.5 3.3*  CL 97* 102  CO2 23 22  GLUCOSE 143* 175*  BUN 10 14  CREATININE 1.13 1.23  CALCIUM 8.8* 8.5*  GFRNONAA >60 >60  ANIONGAP 12 11    Recent Labs  Lab 10/29/20 0026  PROT 7.0  ALBUMIN 3.1*  AST 48*  ALT 46*  ALKPHOS 63  BILITOT 2.2*   Hematology Recent Labs  Lab 10/28/20 2245 10/31/20 0926  WBC 11.2* 14.0*  RBC 4.91 4.66  HGB 14.5 13.6  HCT 42.2 41.4  MCV 85.9 88.8  MCH 29.5 29.2  MCHC 34.4 32.9  RDW 13.1 13.6  PLT 186 192   BNP Recent Labs  Lab 10/31/20 1006  BNP 549.0*    DDimer No results for input(s): DDIMER in the last 168 hours.   Radiology/Studies:  DG Chest 2 View  Result Date: 10/31/2020 CLINICAL DATA:  Left chest pain, shortness of breath and diaphoresis. EXAM: CHEST - 2 VIEW COMPARISON:  10/29/2020 FINDINGS: Improved inspiration. Normal sized heart. Decreased inferior right perihilar airspace opacity. Resolved left perihilar airspace opacity. Decreased prominence of the interstitial markings. No pleural fluid. Unremarkable bones. IMPRESSION: Improved inspiration with decreased right perihilar and resolved left perihilar atelectasis, pneumonia or  edema. Electronically Signed   By: Beckie Salts M.D.   On: 10/31/2020 09:54   CT Angio Chest PE W/Cm &/Or Wo Cm  Result Date: 10/31/2020 CLINICAL DATA:  PE suspected. High probability. Sharp LEFT-sided chest pain. EXAM: CT ANGIOGRAPHY CHEST WITH CONTRAST TECHNIQUE: Multidetector CT imaging of the chest was performed using the standard protocol during bolus administration of intravenous contrast. Multiplanar CT image reconstructions and MIPs were obtained to evaluate the vascular anatomy. CONTRAST:  OMNIPAQUE IOHEXOL 350 MG/ML SOLN COMPARISON:  Chest radiograph 10/31/2020 FINDINGS: Cardiovascular: No pulmonary embolism within the proximal main pulmonary arteries. There is a mottled appearance of the lower lobe pulmonary arteries. No clear evidence of continuous tubular defects. Cannot exclude small pulmonary emboli within the lower lobe pulmonary arteries. Exam degraded by patient body habitus. Mediastinum/Nodes: No axillary or supraclavicular adenopathy. No mediastinal or hilar adenopathy. No pericardial fluid. Esophagus normal. Lungs/Pleura: Rounded consolidation in the superior segment of the RIGHT lower lobe adjacent to the hilum measures 3.1 by 3.1 cm (image 71/7). Mild segmental ground-glass densities distal to this consolidation. Upper Abdomen: Limited view of the liver, kidneys, pancreas are unremarkable. Normal adrenal glands. Musculoskeletal: No aggressive osseous lesion. Review of the MIP images confirms the above findings. IMPRESSION: 1. No convincing evidence of acute pulmonary embolism. Mottled densities in lower lobe pulmonary arteries favored related to patient body habitus. Cannot exclude small distal pulmonary emboli in lower lobes. 2. Rounded consolidation in the superior segment RIGHT lower lobe is favored focus of pneumonia. Recommend follow-up CT after appropriate antibiotic therapy to ensure resolution Electronically Signed   By: Genevive Bi M.D.   On: 10/31/2020 10:36   DG  Chest Port 1 View  Result Date: 10/29/2020 CLINICAL DATA:  Headache, fevers, neck pain and stiffness since Saturday. Negative COVID test. EXAM: PORTABLE CHEST 1 VIEW COMPARISON:  09/06/2009 FINDINGS: Shallow inspiration. Cardiac enlargement. Perihilar infiltrates suggesting edema or  pneumonia. No pleural effusions. No pneumothorax. Mediastinal contours appear intact. IMPRESSION: Cardiac enlargement with perihilar infiltrates suggesting edema or pneumonia. Electronically Signed   By: Burman Nieves M.D.   On: 10/29/2020 02:01     Assessment and Plan:   1. Chest pressure/dyspnea with elevated troponin - hsTroponin low level at 148, repeat pending - symptoms and EKG seem consistent with pericarditis, possibly triggered by recent infection - cannot exclude myopericarditis related to recent Covid infection approximately 1 month ago - per d/w MD, start colchicine 0.6mg  daily - patient has h/o intolerance to ibuprofen due to GI upset if taking regularly so Dr. Elberta Fortis recommends to hold off NSAIDS for right now. He is on colchicine PRN at home but takes this sporadically for what sounds like gout once a week or so. His LFTs were recently elevated so while awaiting follow-up labs, we'll start colchicine once daily. If liver function/CK are normal, can increase to BID. If symptoms persist, consider steroids. Dr. Elberta Fortis does not feel this represents ACS so hold off full dose heparin at present time and trend troponins - obtain echocardiogram - no pericardial effusion mentioned on coronary CTA - Aerin Delany also check CK level given his recent diffuse myalgias and elevated LFTs  2. Community acquired pneumonia - on Augmentin prior to admission - placed order for blood cultures - further abx per IM - he is having a lot of other symptoms that preceded this diagnosis of pericarditis, appreciate their input - Tanda Morrissey need repeat imaging after abx per CT report   3. Recent Covid-19 infection 10/02/20 - patient was  vaccinated but unboosted, but did not require hospitalization at that time and had improved between illnesses - he reports this round of illness seems worse than that infection  4. HTN - BP controlled  5. Hypokalemia - KCl ordered - check Mag - follow lytes while inpatient  Risk Assessment/Risk Scores:     HEAR Score (for undifferentiated chest pain):  HEAR Score: 4  For questions or updates, please contact [facility] Please consult [email or web address] for contact info under    Signed, Laurann Montana, PA-C  10/31/2020 12:38 PM  I have seen and examined this patient with Ronie Spies.  Agree with above, note added to reflect my findings.  On exam, RRR, no murmurs, lungs clear.  Patient presented to the emergency room with chest pain.  In December, he was diagnosed with Covid.  In the past week, he has had fever, chills, sweats.  Was diagnosed with pneumonia.  Also had back pain and had an LP which was negative.  He represented to the hospital today with chest pain.  Pain is worse when he exerts himself, but also worse when he lays flat and takes a deep breath.  His troponin is mildly elevated.  We Daiveon Markman continue to trend his troponins.  His symptoms appear most consistent with pericarditis.  Galena Logie treat with colchicine.  If he continues to have pain, Toradol may be a reasonable alternative.  His BNP is elevated today.  I do not see any evidence of volume overload on exam.  We Kataryna Mcquilkin get a transthoracic echo for further evaluation.  Cyrena Kuchenbecker M. Porchea Charrier MD 10/31/2020 1:22 PM

## 2020-10-31 NOTE — ED Notes (Signed)
Dinner tray ordered.

## 2020-10-31 NOTE — Progress Notes (Signed)
AST/ALT improved. We will increase colchicine to 0.6mg  BID. Not charted as given yet so will start now.

## 2020-10-31 NOTE — ED Notes (Signed)
Pt put in transport system to be moved upstairs

## 2020-10-31 NOTE — H&P (Addendum)
History and Physical    Nathaniel ClanMark L Tiffany ZOX:[id number] DOB: 04/02/1981 DOA: 10/31/2020  PCP: Clovis RileyMitchell, L.August Saucerean, MD Consultants:  Magnus IvanBlackman - orthopedics Tresa EndoKelly - cardiology Patient coming from:  Home - lives with son; NOK: Mother, Dian QueenVeronica Sato, [phone]  Chief Complaint: chest pain  HPI: Nathaniel Jones is a 40 y.o. male with medical history significant of OSA; obesity; pericarditis in his 120s and HTN presenting with chest pain.  He had COVID on 12/31.  He was seen in the UC/ER on 1/23, 1/25, and 1/27.  On 1/23, he complained of headache, chills, and myalgias; he had negative COVID and flu testing and was sent home with supportive care.  He returned on 1/25 with headache with fever; COVID testing was again negative and he was again discharged.  [facility] with neck pain; LP ruled out meningitis and CXR was suspicious for PNA so he was discharged with Augmentin.   Since 1/27, when he rolled over for the LP he noticed pressure in his chest with SOB.  He has been taking antibiotics but he is still having chills and sweats.  He is very fatigued after simple activities.  He has pleuritic pain with deep breaths or yawning.  He is SOB with lying still.  Nothing seems to make it better but it feels worse when he lies down.  He notices rapid HR with lying down.  When he had COVID, it wasn't that bad - headaches, fever, coughing phlegm x 4 days and then reverted to normal.  When he resumed his physical activities he noticed mild SOB that never resolved - struggled to catch a full breath.  He is still having fevers up to 101 as of yesterday AM.  He has mild cough but does have purulent sputum.    ED Course: Pericarditis, PNA. Fever, PNA, negative covid test at UC x 2.  LP on 1/27, given Augmentin.  Started with CP right after the LP - pleuritic, worse with lying down.  Has h/o pericarditis in his 2320s.  CTA negative for PE.  Troponin 147.  Cardiology saw him, thinks pericarditis, recommends  medicine admission.  COVID + on 12/31.  CXR with improving PNA but CTA shows RLL PNA - will give Rocephin and Azithro.  Review of Systems: As per HPI; otherwise review of systems reviewed and negative.   Ambulatory Status:  Ambulates without assistance  COVID Vaccine Status:  Complete, no booster (completed in August)  Past Medical History:  Diagnosis Date  . Hypertension   . Low back pain    herniated disc  . Obesity   . OSA (obstructive sleep apnea)    has not yet received CPAP  . Pericarditis    in his 4520s    Past Surgical History:  Procedure Laterality Date  . KNEE SURGERY Right     Social History   Socioeconomic History  . Marital status: Single    Spouse name: Not on file  . Number of children: 0  . Years of education: Not on file  . Highest education level: Not on file  Occupational History  . 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 Smoking status: Never Smoker  . Smokeless tobacco: Never Used  Substance and Sexual Activity  . Alcohol use: No  . Drug use: No  . Sexual activity: Not on file  Other Topics Concern  . Not on file  Social History Narrative  . Not on file   Social Determinants  of Health   Financial Resource Strain: Not on file  Food Insecurity: Not on file  Transportation Needs: Not on file  Physical Activity: Not on file  Stress: Not on file  Social Connections: Not on file  Intimate Partner Violence: Not on file    Allergies  Allergen Reactions  . Coconut Oil Other (See Comments)    Facial burning  . Ibuprofen Nausea And Vomiting    Reaction to large doses    Family History  Problem Relation Age of Onset  . Hypertension Other   . Diabetes Other   . Hypertension Mother   . Diabetes Mother   . Hypertension Father   . Heart disease Father        Unclear details  . Diabetes Brother   . Stroke Maternal Grandmother   . Diabetes Maternal Grandmother   . High Cholesterol Maternal Grandmother   . Stroke Maternal  Grandfather   . Autoimmune disease Neg Hx     Prior to Admission medications   Medication Sig Start Date End Date Taking? Authorizing Provider  allopurinol (ZYLOPRIM) 100 MG tablet 1 tablet 06/05/20   [provider]  amoxicillin-clavulanate (AUGMENTIN) 875-125 MG tablet Take 1 tablet by mouth every 12 (twelve) hours. 10/29/20   Cardama, Amadeo Garnet, MD  Biotin 1000 MCG tablet 1 tablet    [provider]  colchicine 0.6 MG tablet 1 tablet    [provider]  Cranberry 300 MG tablet See admin instructions.    [provider]  cyclobenzaprine (FLEXERIL) 5 MG tablet Take 1 tablet (5 mg total) by mouth 3 (three) times daily as needed. 09/10/20   Kirtland Bouchard, PA-C  Ferrous Sulfate (IRON) 325 (65 Fe) MG TABS 1 tablet    [provider]  Glucosamine-Chondroitin 500-400 MG CAPS 1 capsule with a meal    [provider]  linaclotide (LINZESS) 145 MCG CAPS capsule 1 capsule    [provider]  Lysine 500 MG CAPS See admin instructions.    [provider]  Misc Natural Products (TART CHERRY ADVANCED) CAPS 1 capsule    [provider]  Multiple Vitamins-Minerals (ONE-A-DAY MENS HEALTH FORMULA) TABS See admin instructions.    [provider]  nabumetone (RELAFEN) 750 MG tablet Take 1 tablet (750 mg total) by mouth 2 (two) times daily as needed. 09/10/20   Kirtland Bouchard, PA-C  Omega-3 Fatty Acids (FISH OIL) 1000 MG CAPS 1 capsule    [provider]  ondansetron (ZOFRAN ODT) 4 MG disintegrating tablet Take 1 tablet (4 mg total) by mouth every 8 (eight) hours as needed for up to 3 days for nausea or vomiting. 10/29/20 11/01/20  Cardama, Amadeo Garnet, MD  pyridOXINE (VITAMIN B-6) 100 MG tablet 1 tablet    [provider]  QUEtiapine (SEROQUEL) 25 MG tablet Take 25 mg by mouth daily.    [provider]  Saw Palmetto, Serenoa repens, (SAW PALMETTO EXTRACT) 160 MG CAPS See admin instructions.     [provider]  sildenafil (VIAGRA) 100 MG tablet Take 100 mg by mouth daily as needed. 05/13/20   [provider]  Specialty Vitamins Products (COLLAGEN ULTRA) CAPS See admin instructions.    [provider]  Turmeric 500 MG TABS See admin instructions.    [provider]  vitamin B-12 (CYANOCOBALAMIN) 1000 MCG tablet 1 tablet    [provider]    Physical Exam: Vitals:   10/31/20 1105 10/31/20 1219 10/31/20 1315 10/31/20 1521  BP: 118/78 117/75 114/88 130/80  Pulse: 88 88 89 94  Resp: (!) 21 (!) 23 11 (!) 25  Temp:      TempSrc:      SpO2: 99% 99% 100% 97%  Weight:      Height:         . General:  Appears calm and comfortable and is in NAD . Eyes:  PERRL, EOMI, normal lids, iris . ENT:  grossly normal hearing, lips & tongue, mmm; appropriate dentition . Neck:  no LAD, masses or thyromegaly . Cardiovascular:  RRR, no m/r/g. No LE edema. +pleuritic CP. Marland Kitchen Respiratory:   CTA bilaterally with no wheezes/rales/rhonchi.  Mildly increased respiratory effort. . Abdomen:  soft, mild LLQ TTP, ND, NABS . Skin:  no rash or induration seen on limited exam . Musculoskeletal:  grossly normal tone BUE/BLE, good ROM, no bony abnormality . Lower extremity:  No LE edema.   2+ distal pulses. Marland Kitchen Psychiatric:  grossly normal mood and affect, speech fluent and appropriate, AOx3 . Neurologic:  CN 2-12 grossly intact, moves all extremities in coordinated fashion    Radiological Exams on Admission: Independently reviewed - see discussion in A/P where applicable  DG Chest 2 View  Result Date: 10/31/2020 CLINICAL DATA:  Left chest pain, shortness of breath and diaphoresis. EXAM: CHEST - 2 VIEW COMPARISON:  10/29/2020 FINDINGS: Improved inspiration. Normal sized heart. Decreased inferior right perihilar airspace opacity. Resolved left perihilar airspace opacity. Decreased prominence of the interstitial markings. No pleural fluid. Unremarkable bones.  IMPRESSION: Improved inspiration with decreased right perihilar and resolved left perihilar atelectasis, pneumonia or edema. Electronically Signed   By: Beckie Salts M.D.   On: 10/31/2020 09:54   CT Angio Chest PE W/Cm &/Or Wo Cm  Result Date: 10/31/2020 CLINICAL DATA:  PE suspected. High probability. Sharp LEFT-sided chest pain. EXAM: CT ANGIOGRAPHY CHEST WITH CONTRAST TECHNIQUE: Multidetector CT imaging of the chest was performed using the standard protocol during bolus administration of intravenous contrast. Multiplanar CT image reconstructions and MIPs were obtained to evaluate the vascular anatomy. CONTRAST:  OMNIPAQUE IOHEXOL 350 MG/ML SOLN COMPARISON:  Chest radiograph 10/31/2020 FINDINGS: Cardiovascular: No pulmonary embolism within the proximal main pulmonary arteries. There is a mottled appearance of the lower lobe pulmonary arteries. No clear evidence of continuous tubular defects. Cannot exclude small pulmonary emboli within the lower lobe pulmonary arteries. Exam degraded by patient body habitus. Mediastinum/Nodes: No axillary or supraclavicular adenopathy. No mediastinal or hilar adenopathy. No pericardial fluid. Esophagus normal. Lungs/Pleura: Rounded consolidation in the superior segment of the RIGHT lower lobe adjacent to the hilum measures 3.1 by 3.1 cm (image 71/7). Mild segmental ground-glass densities distal to this consolidation. Upper Abdomen: Limited view of the liver, kidneys, pancreas are unremarkable. Normal adrenal glands. Musculoskeletal: No aggressive osseous lesion. Review of the MIP images confirms the above findings. IMPRESSION: 1. No convincing evidence of acute pulmonary embolism. Mottled densities in lower lobe pulmonary arteries favored related to patient body habitus. Cannot exclude small distal pulmonary emboli in lower lobes. 2. Rounded consolidation in the superior segment RIGHT lower lobe is favored focus of pneumonia. Recommend follow-up CT after appropriate  antibiotic therapy to ensure resolution Electronically Signed   By: Genevive Bi M.D.   On: 10/31/2020 10:36   ECHOCARDIOGRAM COMPLETE  Result Date: 10/31/2020    ECHOCARDIOGRAM REPORT   Patient Name:   ORHAN MAYORGA Date of Exam: 10/31/2020 Medical Rec #:  [id number]    Height:       75.0  in Accession #:    [id number]   Weight:       296.0 lb Date of Birth:  [birth year]    BSA:          2.592 m Patient Age:    39 years     BP:           117/75 mmHg Patient Gender: M            HR:           91 bpm. Exam Location:  Inpatient Procedure: 2D Echo Indications:    elevated troponin  History:        Patient has prior history of Echocardiogram examinations, most                 recent 08/04/2017. Pericarditis; Risk Factors:Sleep Apnea.  Sonographer:    Delcie Roch Referring Phys: [id number] DAYNA N DUNN IMPRESSIONS  1. Left ventricular ejection fraction, by estimation, is 60 to 65%. The left ventricle has normal function. The left ventricle has no regional wall motion abnormalities. There is moderate left ventricular hypertrophy. Left ventricular diastolic parameters are consistent with Grade I diastolic dysfunction (impaired relaxation).  2. Right ventricular systolic function is normal. The right ventricular size is normal. There is normal pulmonary artery systolic pressure.  3. Left atrial size was mildly dilated.  4. The mitral valve is normal in structure. Trivial mitral valve regurgitation. No evidence of mitral stenosis.  5. The aortic valve is normal in structure. Aortic valve regurgitation is not visualized. No aortic stenosis is present.  6. The inferior vena cava is normal in size with greater than 50% respiratory variability, suggesting right atrial pressure of 3 mmHg. FINDINGS  Left Ventricle: Left ventricular ejection fraction, by estimation, is 60 to 65%. The left ventricle has normal function. The left ventricle has no regional wall motion abnormalities. The left ventricular internal cavity size was  normal in size. There is  moderate left ventricular hypertrophy. Left ventricular diastolic parameters are consistent with Grade I diastolic dysfunction (impaired relaxation). Right Ventricle: The right ventricular size is normal. No increase in right ventricular wall thickness. Right ventricular systolic function is normal. There is normal pulmonary artery systolic pressure. The tricuspid regurgitant velocity is 2.59 m/s, and  with an assumed right atrial pressure of 3 mmHg, the estimated right ventricular systolic pressure is 29.8 mmHg. Left Atrium: Left atrial size was mildly dilated. Right Atrium: Right atrial size was normal in size. Pericardium: There is no evidence of pericardial effusion. Mitral Valve: The mitral valve is normal in structure. Trivial mitral valve regurgitation. No evidence of mitral valve stenosis. Tricuspid Valve: The tricuspid valve is normal in structure. Tricuspid valve regurgitation is trivial. No evidence of tricuspid stenosis. Aortic Valve: The aortic valve is normal in structure. Aortic valve regurgitation is not visualized. No aortic stenosis is present. Pulmonic Valve: The pulmonic valve was normal in structure. Pulmonic valve regurgitation is not visualized. No evidence of pulmonic stenosis. Aorta: The aortic root is normal in size and structure. Venous: The inferior vena cava is normal in size with greater than 50% respiratory variability, suggesting right atrial pressure of 3 mmHg. IAS/Shunts: No atrial level shunt detected by color flow Doppler.  LEFT VENTRICLE PLAX 2D LVIDd:         5.50 cm  Diastology LVIDs:         3.60 cm  LV e' medial:    7.72 cm/s LV PW:         1.40  cm  LV E/e' medial:  8.7 LV IVS:        1.00 cm  LV e' lateral:   8.16 cm/s LVOT diam:     2.30 cm  LV E/e' lateral: 8.2 LV SV:         69 LV SV Index:   26 LVOT Area:     4.15 cm  RIGHT VENTRICLE             IVC RV S prime:     11.50 cm/s  IVC diam: 1.90 cm TAPSE (M-mode): 1.8 cm LEFT ATRIUM              Index       RIGHT ATRIUM           Index LA diam:        4.40 cm 1.70 cm/m  RA Area:     12.30 cm LA Vol (A2C):   64.6 ml 24.92 ml/m RA Volume:   27.00 ml  10.42 ml/m LA Vol (A4C):   62.8 ml 24.23 ml/m LA Biplane Vol: 69.1 ml 26.66 ml/m  AORTIC VALVE LVOT Vmax:   99.20 cm/s LVOT Vmean:  63.000 cm/s LVOT VTI:    0.165 m  AORTA Ao Root diam: 3.80 cm Ao Asc diam:  3.40 cm MITRAL VALVE               TRICUSPID VALVE MV Area (PHT): 4.06 cm    TR Peak grad:   26.8 mmHg MV Decel Time: 187 msec    TR Vmax:        259.00 cm/s MV E velocity: 67.30 cm/s MV A velocity: 50.60 cm/s  SHUNTS MV E/A ratio:  1.33        Systemic VTI:  0.16 m                            Systemic Diam: 2.30 cm Donato Schultz MD Electronically signed by Donato Schultz MD Signature Date/Time: 10/31/2020/1:37:15 PM    Final     EKG: Independently reviewed.   914 - Sinus tachycardia with rate 102; nonspecific ST changes with no evidence of acute ischemia   Labs on Admission: I have personally reviewed the available labs and imaging studies at the time of the admission.  Pertinent labs:   K+ 3.3 Glucose 175 BUN 14/Creatinine 1.23/GFR >60 BNP 549.0 HS troponin 147 -> 133 Lactate 1.7 WBC 14.0 1/27 LP without evidence of meningitis COVID negative on 1/25, 1/27   Assessment/Plan Active Problems:   Obesity   CAP (community acquired pneumonia)   Pericarditis, viral   Essential hypertension   OSA (obstructive sleep apnea)   CAP -Patient with COVID infection about 1 month ago -Now presenting with recurrent URI symptoms which have worsened over the last week -He was seen twice at [facility] and then at the ER; negative for meningitis but CXR suspicious for perihilar infiltrates suggestive of PNA -He has had worsening pleuritic CP and SOB and so returned to the ER -CXR appeared to be improved but CT shows RLL PNA -Will check procalcitonin -No current concern for sepsis -Failed outpatient treatment with Augmentin -Will admit to telemetry  for ongoing care -Will initiate treatment with Rocephin/Azithromycin -Will check respiratory virus panel -He is once again COVID negative -Needs f/u imaging once illness has resolved  Pericarditis -Patient with pleuritic CP that is worse with lying down  -Elevated troponin -Appears to be c/w pericarditis, possibly related to prior  COVID infection vs. More recent infection -Cardiology consulted -He was started on Colchicine 0.6 mg daily by cardiology -Echo ordered by cardiology and shows no effusion, preserved EF, and grade 1 diastolic dysfunction  HTN -He does not appear to be taking medications for this issue at this time  OSA -Has not yet received CPAP but is awaiting that -Reports that he sleeps very poorly -Takes Seroquel as needed to help with sleep  Obesity -Body mass index is 37 kg/m..  -Weight loss should be encouraged -Outpatient PCP/bariatric medicine/bariatric surgery f/u encouraged    Note: This patient has been tested and is negative for the novel coronavirus COVID-19. The patient has been fully vaccinated against COVID-19.   Level of care: Telemetry MedicalTelemetry medical DVT prophylaxis:  Lovenox  Code Status:  Full - confirmed with patient Family Communication: None present Disposition Plan:  The patient is from: home  Anticipated d/c is to: home without [facility] services   Anticipated d/c date will depend on clinical response to treatment, but likely 2-3 days  Patient is currently: acutely ill Consults called: Cardiology Admission status:  Admit - It is my clinical opinion that admission to INPATIENT is reasonable and necessary because of the expectation that this patient will require hospital care that crosses at least 2 midnights to treat this condition based on the medical complexity of the problems presented.  Given the aforementioned information, the predictability of an adverse outcome is felt to be significant.    Jonah Blue MD [facility]   How to contact the [facility] Attending or Consulting provider 7A - 7P or covering provider during after hours 7P -7A, for this patient?  1. Check the care team in [facility] and look for a) attending/consulting [facility] provider listed and b) the [facility] team listed 2. Log into [email or web address] and use [place]'s universal password to access. If you do not have the password, please contact the hospital operator. 3. Locate the [facility] provider you are looking for under [facility] and page to a number that you can be directly reached. 4. If you still have difficulty reaching the provider, please page the [facility] (Director on Call) for the Hospitalists listed on amion for assistance.   10/31/2020, 5:49 PM

## 2020-10-31 NOTE — Progress Notes (Signed)
  Echocardiogram 2D Echocardiogram has been performed.  Delcie Roch 10/31/2020, 1:19 PM

## 2020-11-01 DIAGNOSIS — I301 Infective pericarditis: Secondary | ICD-10-CM | POA: Diagnosis not present

## 2020-11-01 DIAGNOSIS — I10 Essential (primary) hypertension: Secondary | ICD-10-CM

## 2020-11-01 LAB — CSF CULTURE W GRAM STAIN: Culture: NO GROWTH

## 2020-11-01 LAB — CBC WITH DIFFERENTIAL/PLATELET
Abs Immature Granulocytes: 0.13 10*3/uL — ABNORMAL HIGH (ref 0.00–0.07)
Basophils Absolute: 0 10*3/uL (ref 0.0–0.1)
Basophils Relative: 0 %
Eosinophils Absolute: 0.5 10*3/uL (ref 0.0–0.5)
Eosinophils Relative: 4 %
HCT: 39 % (ref 39.0–52.0)
Hemoglobin: 12.8 g/dL — ABNORMAL LOW (ref 13.0–17.0)
Immature Granulocytes: 1 %
Lymphocytes Relative: 12 %
Lymphs Abs: 1.8 10*3/uL (ref 0.7–4.0)
MCH: 29.2 pg (ref 26.0–34.0)
MCHC: 32.8 g/dL (ref 30.0–36.0)
MCV: 88.8 fL (ref 80.0–100.0)
Monocytes Absolute: 0.8 10*3/uL (ref 0.1–1.0)
Monocytes Relative: 5 %
Neutro Abs: 11.3 10*3/uL — ABNORMAL HIGH (ref 1.7–7.7)
Neutrophils Relative %: 78 %
Platelets: 199 10*3/uL (ref 150–400)
RBC: 4.39 MIL/uL (ref 4.22–5.81)
RDW: 13.9 % (ref 11.5–15.5)
WBC: 14.5 10*3/uL — ABNORMAL HIGH (ref 4.0–10.5)
nRBC: 0 % (ref 0.0–0.2)

## 2020-11-01 LAB — BASIC METABOLIC PANEL
Anion gap: 13 (ref 5–15)
BUN: 10 mg/dL (ref 6–20)
CO2: 18 mmol/L — ABNORMAL LOW (ref 22–32)
Calcium: 8.2 mg/dL — ABNORMAL LOW (ref 8.9–10.3)
Chloride: 105 mmol/L (ref 98–111)
Creatinine, Ser: 1.07 mg/dL (ref 0.61–1.24)
GFR, Estimated: >60 mL/min (ref >60)
Glucose, Bld: 106 mg/dL — ABNORMAL HIGH (ref 70–99)
Potassium: 4.3 mmol/L (ref 3.5–5.1)
Sodium: 136 mmol/L (ref 135–145)

## 2020-11-01 MED ORDER — CYCLOBENZAPRINE HCL 10 MG PO TABS
10.0000 mg | ORAL_TABLET | Freq: Three times a day (TID) | ORAL | Status: DC | PRN
  Administered 2020-11-01 (×2): 10 mg via ORAL
  Filled 2020-11-01 (×2): qty 1

## 2020-11-01 NOTE — Progress Notes (Addendum)
[facility]  PROGRESS NOTE  Nathaniel Jones ZOX:[id number] DOB: 06/28/1981 DOA: 10/31/2020 PCP: Clovis RileyMitchell, L.August Saucerean, MD   Brief HPI:   40 year old woman with a history of pericarditis in 20s , hypertension, obstructive sleep apnea presented with chest pain.  Patient had reported infection on 10/02/2021.  He presented with headache, chills and myalgias.  Patient had negative covid and influenza testing.  He was sent home with supportive care.  Patient return 10/27/2020 with headache and fever at that time also he had covid 19  testing which was negative.  [facility] then [facility] with neck pain; LP ruled out meningitis and CXR was suspicious for PNA so he was discharged with Augmentin.   Since 1/27, when he rolled over for the LP he noticed pressure in his chest with SOB.  He has been taking antibiotics but he is still having chills and sweats.  He is very fatigued after simple activities.  He has pleuritic pain with deep breaths or yawning.  He is SOB with lying still.  Nothing seems to make it better but it feels worse when he lies down.  He notices rapid HR with lying down.  When he had COVID, it wasn't that bad - headaches, fever, coughing phlegm x 4 days and then reverted to normal.  When he resumed his physical activities he noticed mild SOB that never resolved - struggled to catch a full breath.  He is still having fevers up to 101 as of yesterday AM.  He has mild cough but does have purulent sputum. Patient was admitted with pneumonia, pericarditis.  Subjective   Patient seen and examined, complains of headache and neck pain.  Patient says that he has had chronic neck pain, MRI of cervical spine from 2011 showed cervical spondylosis.   Assessment/Plan:     1. Community-acquired pneumonia-patient presented with fever, upper respiratory symptoms.  Chest x-ray shows perihilar infiltrates suggestive of pneumonia.  Procalcitonin greater than 4.  CRP 40.1.  COVID-19 PCR is negative.   Patient started on ceftriaxone and Zithromax.  Blood cultures negative to date.  Follow blood culture results. 2. Neck pain-likely muscle spasm, patient does have history of cervical spondylosis.  He is not interested in getting MRI cervical spine at this time due to persistent neck pain.  Started on Flexeril 10 mg 3 times daily as needed.  Will check again in a.m. to see if patient wants an MRI of cervical spine. 3. Pericarditis-patient presented with pleuritic chest pain which was worse on lying down.  Likely post viral.  He did have mild elevation of troponin.  Cardiology was consulted.  Pericarditis seems to have improved after patient started on colchicine 0.6 mg p.o. twice daily per cardiology.  Echocardiogram did not show pericardial effusion. 4. Hypertension-patient not take medications at home.  Blood pressure is stable. 5. OSA-patient has not received CPAP but is awaiting for that. 6. Obesity-BMI 37 kg/m.  Encourage weight loss.     COVID-19 Labs  Recent Labs    10/31/20 1246  CRP 40.1*    Lab Results  Component Value Date   SARSCOV2NAA NEGATIVE 10/31/2020   SARSCOV2NAA NEGATIVE 10/29/2020   SARSCOV2NAA NEGATIVE 10/27/2020   SARSCOV2NAA NEGATIVE 10/25/2020     Scheduled medications:   . allopurinol  100 mg Oral Daily  . colchicine  0.6 mg Oral BID  . enoxaparin (LOVENOX) injection  65 mg Subcutaneous Q24H  . QUEtiapine  25 mg Oral Daily  CBG: Recent Labs  Lab 10/31/20 0943  GLUCAP 166*    SpO2: 96 %    CBC: Recent Labs  Lab 10/28/20 2245 10/31/20 0926 11/01/20 0435  WBC 11.2* 14.0* 14.5*  NEUTROABS  --   --  11.3*  HGB 14.5 13.6 12.8*  HCT 42.2 41.4 39.0  MCV 85.9 88.8 88.8  PLT 186 192 199    Basic Metabolic Panel: Recent Labs  Lab 10/28/20 2245 10/31/20 0926 10/31/20 1246 11/01/20 0435  NA 132* 135  --  136  K 3.5 3.3*  --  4.3  CL 97* 102  --  105  CO2 23 22  --  18*  GLUCOSE 143* 175*  --  106*  BUN 10 14  --  10   CREATININE 1.13 1.23  --  1.07  CALCIUM 8.8* 8.5*  --  8.2*  MG  --   --  2.5*  --      Liver Function Tests: Recent Labs  Lab 10/29/20 0026 10/31/20 1246  AST 48* 34  ALT 46* 39  ALKPHOS 63 75  BILITOT 2.2* 1.1  PROT 7.0 6.9  ALBUMIN 3.1* 2.6*     Antibiotics: Anti-infectives (From admission, onward)   Start     Dose/Rate Route Frequency Ordered Stop   11/01/20 1300  cefTRIAXone (ROCEPHIN) 2 g in sodium chloride 0.9 % 100 mL IVPB        2 g 200 mL/hr over 30 Minutes Intravenous Every 24 hours 10/31/20 1319 11/05/20 1259   11/01/20 1300  azithromycin (ZITHROMAX) 500 mg in sodium chloride 0.9 % 250 mL IVPB        500 mg 250 mL/hr over 60 Minutes Intravenous Every 24 hours 10/31/20 1319 11/05/20 1259   10/31/20 1200  cefTRIAXone (ROCEPHIN) 1 g in sodium chloride 0.9 % 100 mL IVPB        1 g 200 mL/hr over 30 Minutes Intravenous  Once 10/31/20 1156 10/31/20 1820   10/31/20 1200  azithromycin (ZITHROMAX) 500 mg in sodium chloride 0.9 % 250 mL IVPB        500 mg 250 mL/hr over 60 Minutes Intravenous  Once 10/31/20 1156 10/31/20 1820       DVT prophylaxis: Lovenox  Code Status: Full code  Family Communication: No family at bedside      Objective   Vitals:   11/01/20 0400 11/01/20 0451 11/01/20 0800 11/01/20 1000  BP: 122/64 132/78 130/76   Pulse: 78 92 90   Resp: 18 20 20    Temp: 98.8 F (37.1 C) 97.7 F (36.5 C) 97.8 F (36.6 C) (!) 97 F (36.1 C)  TempSrc: Oral     SpO2: 97% 96% 96%   Weight:      Height:        Intake/Output Summary (Last 24 hours) at 11/01/2020 1512 Last data filed at 11/01/2020 1100 Gross per 24 hour  Intake 1220 ml  Output 1200 ml  Net 20 ml    01/28 1901 - 01/30 0700 In: 2100 [P.O.:350; I.V.:750] Out: 400 [Urine:400]  Filed Weights   10/31/20 0958 10/31/20 2101  Weight: 134.3 kg (!) 142.2 kg    Physical Examination:    General-appears in no acute distress  Heart-S1-S2, regular, no murmur  auscultated  HEENT-positive right para cervical muscle tenderness noted, no limitation of ROM on neck movements.  Lungs-clear to auscultation bilaterally, no wheezing or crackles auscultated  Abdomen-soft, nontender, no organomegaly  Extremities-no edema in the lower extremities  Neuro-alert, oriented x3, no  focal deficit noted   Status is: Inpatient  Dispo: The patient is from: Home              Anticipated d/c is to: Home              Anticipated d/c date is: 11/04/2020              Patient currently not stable for discharge  Barrier to discharge-treatment for community-acquired pneumonia, neck pain       Data Reviewed:   Recent Results (from the past 240 hour(s))  SARS CORONAVIRUS 2 (TAT 6-24 HRS) Nasopharyngeal Nasopharyngeal Swab     Status: None   Collection Time: 10/25/20  7:06 PM   Specimen: Nasopharyngeal Swab  Result Value Ref Range Status   SARS Coronavirus 2 NEGATIVE NEGATIVE Final    Comment: (NOTE) SARS-CoV-2 target nucleic acids are NOT DETECTED.  The SARS-CoV-2 RNA is generally detectable in upper and lower respiratory specimens during the acute phase of infection. Negative results do not preclude SARS-CoV-2 infection, do not rule out co-infections with other pathogens, and should not be used as the sole basis for treatment or other patient management decisions. Negative results must be combined with clinical observations, patient history, and epidemiological information. The expected result is Negative.  Fact Sheet for Patients: HairSlick.no  Fact Sheet for Healthcare Providers: quierodirigir.com  This test is not yet approved or cleared by the [place] FDA and  has been authorized for detection and/or diagnosis of SARS-CoV-2 by FDA under an Emergency Use Authorization (EUA). This EUA will remain  in effect (meaning this test can be used) for the duration of the COVID-19 declaration under  Se ction 564(b)(1) of the Act, 21 U.S.C. section 360bbb-3(b)(1), unless the authorization is terminated or revoked sooner.  Performed at [facility], [address] [place], [place] [address]   SARS CORONAVIRUS 2 (TAT 6-24 HRS) Nasopharyngeal Nasopharyngeal Swab     Status: None   Collection Time: 10/27/20  5:50 PM   Specimen: Nasopharyngeal Swab  Result Value Ref Range Status   SARS Coronavirus 2 NEGATIVE NEGATIVE Final    Comment: (NOTE) SARS-CoV-2 target nucleic acids are NOT DETECTED.  The SARS-CoV-2 RNA is generally detectable in upper and lower respiratory specimens during the acute phase of infection. Negative results do not preclude SARS-CoV-2 infection, do not rule out co-infections with other pathogens, and should not be used as the sole basis for treatment or other patient management decisions. Negative results must be combined with clinical observations, patient history, and epidemiological information. The expected result is Negative.  Fact Sheet for Patients: HairSlick.no  Fact Sheet for Healthcare Providers: quierodirigir.com  This test is not yet approved or cleared by the [place] FDA and  has been authorized for detection and/or diagnosis of SARS-CoV-2 by FDA under an Emergency Use Authorization (EUA). This EUA will remain  in effect (meaning this test can be used) for the duration of the COVID-19 declaration under Se ction 564(b)(1) of the Act, 21 U.S.C. section 360bbb-3(b)(1), unless the authorization is terminated or revoked sooner.  Performed at [facility], [address] [place], [place] [address]   SARS Coronavirus 2 by RT PCR (hospital order, performed in [facility] hospital lab) Nasopharyngeal Nasopharyngeal Swab     Status: None   Collection Time: 10/29/20 12:53 AM   Specimen: Nasopharyngeal Swab  Result Value Ref Range Status   SARS Coronavirus 2 NEGATIVE NEGATIVE Final     Comment: (NOTE) SARS-CoV-2  target nucleic acids are NOT DETECTED.  The SARS-CoV-2 RNA is generally detectable in upper and lower respiratory specimens during the acute phase of infection. The lowest concentration of SARS-CoV-2 viral copies this assay can detect is 250 copies / mL. A negative result does not preclude SARS-CoV-2 infection and should not be used as the sole basis for treatment or other patient management decisions.  A negative result may occur with improper specimen collection / handling, submission of specimen other than nasopharyngeal swab, presence of viral mutation(s) within the areas targeted by this assay, and inadequate number of viral copies (<250 copies / mL). A negative result must be combined with clinical observations, patient history, and epidemiological information.  Fact Sheet for Patients:   BoilerBrush.com.cy  Fact Sheet for Healthcare Providers: https://pope.com/  This test is not yet approved or  cleared by the [place] FDA and has been authorized for detection and/or diagnosis of SARS-CoV-2 by FDA under an Emergency Use Authorization (EUA).  This EUA will remain in effect (meaning this test can be used) for the duration of the COVID-19 declaration under Section 564(b)(1) of the Act, 21 U.S.C. section 360bbb-3(b)(1), unless the authorization is terminated or revoked sooner.  Performed at [facility], [address], [place], [place] [address]   CSF culture     Status: None   Collection Time: 10/29/20  2:58 AM   Specimen: CSF; Cerebrospinal Fluid  Result Value Ref Range Status   Specimen Description CSF  Final   Special Requests NONE  Final   Gram Stain   Final    WBC PRESENT, PREDOMINANTLY MONONUCLEAR NO ORGANISMS SEEN CYTOSPIN SMEAR    Culture   Final    NO GROWTH 3 DAYS Performed at [facility], [address] [place], [place] [address]    Report Status 11/01/2020  FINAL  Final  SARS Coronavirus 2 by RT PCR (hospital order, performed in [facility] hospital lab) Nasopharyngeal Nasopharyngeal Swab     Status: None   Collection Time: 10/31/20 11:24 AM   Specimen: Nasopharyngeal Swab  Result Value Ref Range Status   SARS Coronavirus 2 NEGATIVE NEGATIVE Final    Comment: (NOTE) SARS-CoV-2 target nucleic acids are NOT DETECTED.  The SARS-CoV-2 RNA is generally detectable in upper and lower respiratory specimens during the acute phase of infection. The lowest concentration of SARS-CoV-2 viral copies this assay can detect is 250 copies / mL. A negative result does not preclude SARS-CoV-2 infection and should not be used as the sole basis for treatment or other patient management decisions.  A negative result may occur with improper specimen collection / handling, submission of specimen other than nasopharyngeal swab, presence of viral mutation(s) within the areas targeted by this assay, and inadequate number of viral copies (<250 copies / mL). A negative result must be combined with clinical observations, patient history, and epidemiological information.  Fact Sheet for Patients:   BoilerBrush.com.cy  Fact Sheet for Healthcare Providers: https://pope.com/  This test is not yet approved or  cleared by the [place] FDA and has been authorized for detection and/or diagnosis of SARS-CoV-2 by FDA under an Emergency Use Authorization (EUA).  This EUA will remain in effect (meaning this test can be used) for the duration of the COVID-19 declaration under Section 564(b)(1) of the Act, 21 U.S.C. section 360bbb-3(b)(1), unless the authorization is terminated or revoked sooner.  Performed at [facility], [address] [place], [place] [address]   Culture, blood (Routine X  2) w Reflex to ID Panel     Status: None (Preliminary result)   Collection Time: 10/31/20 12:45 PM   Specimen: BLOOD RIGHT  HAND  Result Value Ref Range Status   Specimen Description BLOOD RIGHT HAND  Final   Special Requests   Final    BOTTLES DRAWN AEROBIC AND ANAEROBIC Blood Culture adequate volume   Culture   Final    NO GROWTH < 24 HOURS Performed at [facility], [address] [place], [place] [address]    Report Status PENDING  Incomplete    No results for input(s): LIPASE, AMYLASE in the last 168 hours. No results for input(s): AMMONIA in the last 168 hours.  Cardiac Enzymes: Recent Labs  Lab 10/31/20 1246  CKTOTAL 118   BNP (last 3 results) Recent Labs    10/31/20 1006  BNP 549.0*    ProBNP (last 3 results) No results for input(s): PROBNP in the last 8760 hours.  Studies:  DG Chest 2 View  Result Date: 10/31/2020 CLINICAL DATA:  Left chest pain, shortness of breath and diaphoresis. EXAM: CHEST - 2 VIEW COMPARISON:  10/29/2020 FINDINGS: Improved inspiration. Normal sized heart. Decreased inferior right perihilar airspace opacity. Resolved left perihilar airspace opacity. Decreased prominence of the interstitial markings. No pleural fluid. Unremarkable bones. IMPRESSION: Improved inspiration with decreased right perihilar and resolved left perihilar atelectasis, pneumonia or edema. Electronically Signed   By: Beckie Salts M.D.   On: 10/31/2020 09:54   CT Angio Chest PE W/Cm &/Or Wo Cm  Result Date: 10/31/2020 CLINICAL DATA:  PE suspected. High probability. Sharp LEFT-sided chest pain. EXAM: CT ANGIOGRAPHY CHEST WITH CONTRAST TECHNIQUE: Multidetector CT imaging of the chest was performed using the standard protocol during bolus administration of intravenous contrast. Multiplanar CT image reconstructions and MIPs were obtained to evaluate the vascular anatomy. CONTRAST:  OMNIPAQUE IOHEXOL 350 MG/ML SOLN COMPARISON:  Chest radiograph 10/31/2020 FINDINGS: Cardiovascular: No pulmonary embolism within the proximal main pulmonary arteries. There is a mottled appearance of the lower lobe  pulmonary arteries. No clear evidence of continuous tubular defects. Cannot exclude small pulmonary emboli within the lower lobe pulmonary arteries. Exam degraded by patient body habitus. Mediastinum/Nodes: No axillary or supraclavicular adenopathy. No mediastinal or hilar adenopathy. No pericardial fluid. Esophagus normal. Lungs/Pleura: Rounded consolidation in the superior segment of the RIGHT lower lobe adjacent to the hilum measures 3.1 by 3.1 cm (image 71/7). Mild segmental ground-glass densities distal to this consolidation. Upper Abdomen: Limited view of the liver, kidneys, pancreas are unremarkable. Normal adrenal glands. Musculoskeletal: No aggressive osseous lesion. Review of the MIP images confirms the above findings. IMPRESSION: 1. No convincing evidence of acute pulmonary embolism. Mottled densities in lower lobe pulmonary arteries favored related to patient body habitus. Cannot exclude small distal pulmonary emboli in lower lobes. 2. Rounded consolidation in the superior segment RIGHT lower lobe is favored focus of pneumonia. Recommend follow-up CT after appropriate antibiotic therapy to ensure resolution Electronically Signed   By: Genevive Bi M.D.   On: 10/31/2020 10:36   ECHOCARDIOGRAM COMPLETE  Result Date: 10/31/2020    ECHOCARDIOGRAM REPORT   Patient Name:   JHAMIR PICKUP Date of Exam: 10/31/2020 Medical Rec #:  [id number]    Height:       75.0 in Accession #:    [id number]   Weight:       296.0 lb Date of Birth:  Feb 14, 1981    BSA:          2.592  m Patient Age:    39 years     BP:           117/75 mmHg Patient Gender: M            HR:           91 bpm. Exam Location:  Inpatient Procedure: 2D Echo Indications:    elevated troponin  History:        Patient has prior history of Echocardiogram examinations, most                 recent 08/04/2017. Pericarditis; Risk Factors:Sleep Apnea.  Sonographer:    Delcie Roch Referring Phys: [id number] DAYNA N DUNN IMPRESSIONS  1. Left ventricular ejection  fraction, by estimation, is 60 to 65%. The left ventricle has normal function. The left ventricle has no regional wall motion abnormalities. There is moderate left ventricular hypertrophy. Left ventricular diastolic parameters are consistent with Grade I diastolic dysfunction (impaired relaxation).  2. Right ventricular systolic function is normal. The right ventricular size is normal. There is normal pulmonary artery systolic pressure.  3. Left atrial size was mildly dilated.  4. The mitral valve is normal in structure. Trivial mitral valve regurgitation. No evidence of mitral stenosis.  5. The aortic valve is normal in structure. Aortic valve regurgitation is not visualized. No aortic stenosis is present.  6. The inferior vena cava is normal in size with greater than 50% respiratory variability, suggesting right atrial pressure of 3 mmHg. FINDINGS  Left Ventricle: Left ventricular ejection fraction, by estimation, is 60 to 65%. The left ventricle has normal function. The left ventricle has no regional wall motion abnormalities. The left ventricular internal cavity size was normal in size. There is  moderate left ventricular hypertrophy. Left ventricular diastolic parameters are consistent with Grade I diastolic dysfunction (impaired relaxation). Right Ventricle: The right ventricular size is normal. No increase in right ventricular wall thickness. Right ventricular systolic function is normal. There is normal pulmonary artery systolic pressure. The tricuspid regurgitant velocity is 2.59 m/s, and  with an assumed right atrial pressure of 3 mmHg, the estimated right ventricular systolic pressure is 29.8 mmHg. Left Atrium: Left atrial size was mildly dilated. Right Atrium: Right atrial size was normal in size. Pericardium: There is no evidence of pericardial effusion. Mitral Valve: The mitral valve is normal in structure. Trivial mitral valve regurgitation. No evidence of mitral valve stenosis. Tricuspid Valve: The  tricuspid valve is normal in structure. Tricuspid valve regurgitation is trivial. No evidence of tricuspid stenosis. Aortic Valve: The aortic valve is normal in structure. Aortic valve regurgitation is not visualized. No aortic stenosis is present. Pulmonic Valve: The pulmonic valve was normal in structure. Pulmonic valve regurgitation is not visualized. No evidence of pulmonic stenosis. Aorta: The aortic root is normal in size and structure. Venous: The inferior vena cava is normal in size with greater than 50% respiratory variability, suggesting right atrial pressure of 3 mmHg. IAS/Shunts: No atrial level shunt detected by color flow Doppler.  LEFT VENTRICLE PLAX 2D LVIDd:         5.50 cm  Diastology LVIDs:         3.60 cm  LV e' medial:    7.72 cm/s LV PW:         1.40 cm  LV E/e' medial:  8.7 LV IVS:        1.00 cm  LV e' lateral:   8.16 cm/s LVOT diam:     2.30 cm  LV  E/e' lateral: 8.2 LV SV:         69 LV SV Index:   26 LVOT Area:     4.15 cm  RIGHT VENTRICLE             IVC RV S prime:     11.50 cm/s  IVC diam: 1.90 cm TAPSE (M-mode): 1.8 cm LEFT ATRIUM             Index       RIGHT ATRIUM           Index LA diam:        4.40 cm 1.70 cm/m  RA Area:     12.30 cm LA Vol (A2C):   64.6 ml 24.92 ml/m RA Volume:   27.00 ml  10.42 ml/m LA Vol (A4C):   62.8 ml 24.23 ml/m LA Biplane Vol: 69.1 ml 26.66 ml/m  AORTIC VALVE LVOT Vmax:   99.20 cm/s LVOT Vmean:  63.000 cm/s LVOT VTI:    0.165 m  AORTA Ao Root diam: 3.80 cm Ao Asc diam:  3.40 cm MITRAL VALVE               TRICUSPID VALVE MV Area (PHT): 4.06 cm    TR Peak grad:   26.8 mmHg MV Decel Time: 187 msec    TR Vmax:        259.00 cm/s MV E velocity: 67.30 cm/s MV A velocity: 50.60 cm/s  SHUNTS MV E/A ratio:  1.33        Systemic VTI:  0.16 m                            Systemic Diam: 2.30 cm Donato Schultz MD Electronically signed by Donato Schultz MD Signature Date/Time: 10/31/2020/1:37:15 PM    Final        Meredeth Ide   [facility] If 7PM-7AM,  please contact night-coverage at [email or web address], Office  [phone]   11/01/2020, 3:12 PM  LOS: 1 day

## 2020-11-01 NOTE — Plan of Care (Signed)

## 2020-11-01 NOTE — Progress Notes (Signed)
Progress Note  Patient Name: Nathaniel Jones Date of Encounter: 11/01/2020  [facility] Cardiologist: Nicki Guadalajara, MD   Subjective   Currently feeling well.  No current chest pain.  Can lay flat and take deep breaths without issue.  Inpatient Medications    Scheduled Meds: . allopurinol  100 mg Oral Daily  . colchicine  0.6 mg Oral BID  . enoxaparin (LOVENOX) injection  65 mg Subcutaneous Q24H  . QUEtiapine  25 mg Oral Daily   Continuous Infusions: . sodium chloride 75 mL/hr at 10/31/20 2110  . azithromycin    . cefTRIAXone (ROCEPHIN)  IV     PRN Meds: acetaminophen, cyclobenzaprine, guaiFENesin, ondansetron (ZOFRAN) IV   Vital Signs    Vitals:   10/31/20 2101 11/01/20 0000 11/01/20 0400 11/01/20 0451  BP: (!) 144/94 120/62 122/64 132/78  Pulse: 97 80 78 92  Resp: (!) 21 20 18 20   Temp: 98.8 F (37.1 C) 98.8 F (37.1 C) 98.8 F (37.1 C) 97.7 F (36.5 C)  TempSrc: Oral Oral Oral   SpO2: 99% 98% 97% 96%  Weight: (!) 142.2 kg     Height: 6\' 4"  (1.93 m)       Intake/Output Summary (Last 24 hours) at 11/01/2020 1139 Last data filed at 11/01/2020 1100 Gross per 24 hour  Intake 2220 ml  Output 1200 ml  Net 1020 ml   Last 3 Weights 10/31/2020 10/31/2020 10/27/2020  Weight (lbs) 313 lb 8 oz 296 lb 307 lb  Weight (kg) 142.203 kg 134.265 kg 139.254 kg      Telemetry    Sinus rhythm- Personally Reviewed  ECG    None new- Personally Reviewed  Physical Exam   GEN: No acute distress.   Neck: No JVD Cardiac: RRR, no murmurs, rubs, or gallops.  Respiratory: Clear to auscultation bilaterally. GI: Soft, nontender, non-distended  MS: No edema; No deformity. Neuro:  Nonfocal  Psych: Normal affect   Labs    High Sensitivity Troponin:   Recent Labs  Lab 10/31/20 0926 10/31/20 1246  TROPONINIHS 147* 133*      Chemistry Recent Labs  Lab 10/28/20 2245 10/29/20 0026 10/31/20 0926 10/31/20 1246 11/01/20 0435  NA 132*  --  135  --  136  K 3.5  --  3.3*   --  4.3  CL 97*  --  102  --  105  CO2 23  --  22  --  18*  GLUCOSE 143*  --  175*  --  106*  BUN 10  --  14  --  10  CREATININE 1.13  --  1.23  --  1.07  CALCIUM 8.8*  --  8.5*  --  8.2*  PROT  --  7.0  --  6.9  --   ALBUMIN  --  3.1*  --  2.6*  --   AST  --  48*  --  34  --   ALT  --  46*  --  39  --   ALKPHOS  --  63  --  75  --   BILITOT  --  2.2*  --  1.1  --   GFRNONAA >60  --  >60  --  >60  ANIONGAP 12  --  11  --  13     Hematology Recent Labs  Lab 10/28/20 2245 10/31/20 0926 11/01/20 0435  WBC 11.2* 14.0* 14.5*  RBC 4.91 4.66 4.39  HGB 14.5 13.6 12.8*  HCT 42.2 41.4 39.0  MCV  85.9 88.8 88.8  MCH 29.5 29.2 29.2  MCHC 34.4 32.9 32.8  RDW 13.1 13.6 13.9  PLT 186 192 199    BNP Recent Labs  Lab 10/31/20 1006  BNP 549.0*     DDimer No results for input(s): DDIMER in the last 168 hours.   Radiology    DG Chest 2 View  Result Date: 10/31/2020 CLINICAL DATA:  Left chest pain, shortness of breath and diaphoresis. EXAM: CHEST - 2 VIEW COMPARISON:  10/29/2020 FINDINGS: Improved inspiration. Normal sized heart. Decreased inferior right perihilar airspace opacity. Resolved left perihilar airspace opacity. Decreased prominence of the interstitial markings. No pleural fluid. Unremarkable bones. IMPRESSION: Improved inspiration with decreased right perihilar and resolved left perihilar atelectasis, pneumonia or edema. Electronically Signed   By: Beckie Salts M.D.   On: 10/31/2020 09:54   CT Angio Chest PE W/Cm &/Or Wo Cm  Result Date: 10/31/2020 CLINICAL DATA:  PE suspected. High probability. Sharp LEFT-sided chest pain. EXAM: CT ANGIOGRAPHY CHEST WITH CONTRAST TECHNIQUE: Multidetector CT imaging of the chest was performed using the standard protocol during bolus administration of intravenous contrast. Multiplanar CT image reconstructions and MIPs were obtained to evaluate the vascular anatomy. CONTRAST:  OMNIPAQUE IOHEXOL 350 MG/ML SOLN COMPARISON:  Chest  radiograph 10/31/2020 FINDINGS: Cardiovascular: No pulmonary embolism within the proximal main pulmonary arteries. There is a mottled appearance of the lower lobe pulmonary arteries. No clear evidence of continuous tubular defects. Cannot exclude small pulmonary emboli within the lower lobe pulmonary arteries. Exam degraded by patient body habitus. Mediastinum/Nodes: No axillary or supraclavicular adenopathy. No mediastinal or hilar adenopathy. No pericardial fluid. Esophagus normal. Lungs/Pleura: Rounded consolidation in the superior segment of the RIGHT lower lobe adjacent to the hilum measures 3.1 by 3.1 cm (image 71/7). Mild segmental ground-glass densities distal to this consolidation. Upper Abdomen: Limited view of the liver, kidneys, pancreas are unremarkable. Normal adrenal glands. Musculoskeletal: No aggressive osseous lesion. Review of the MIP images confirms the above findings. IMPRESSION: 1. No convincing evidence of acute pulmonary embolism. Mottled densities in lower lobe pulmonary arteries favored related to patient body habitus. Cannot exclude small distal pulmonary emboli in lower lobes. 2. Rounded consolidation in the superior segment RIGHT lower lobe is favored focus of pneumonia. Recommend follow-up CT after appropriate antibiotic therapy to ensure resolution Electronically Signed   By: Genevive Bi M.D.   On: 10/31/2020 10:36   ECHOCARDIOGRAM COMPLETE  Result Date: 10/31/2020    ECHOCARDIOGRAM REPORT   Patient Name:   Nathaniel Jones Date of Exam: 10/31/2020 Medical Rec #:  [id number]    Height:       75.0 in Accession #:    [id number]   Weight:       296.0 lb Date of Birth:  1981-01-20    BSA:          2.592 m Patient Age:    39 years     BP:           117/75 mmHg Patient Gender: M            HR:           91 bpm. Exam Location:  Inpatient Procedure: 2D Echo Indications:    elevated troponin  History:        Patient has prior history of Echocardiogram examinations, most                  recent 08/04/2017. Pericarditis; Risk Factors:Sleep Apnea.  Sonographer:    Delcie Roch  Referring Phys: [id number] DAYNA N DUNN IMPRESSIONS  1. Left ventricular ejection fraction, by estimation, is 60 to 65%. The left ventricle has normal function. The left ventricle has no regional wall motion abnormalities. There is moderate left ventricular hypertrophy. Left ventricular diastolic parameters are consistent with Grade I diastolic dysfunction (impaired relaxation).  2. Right ventricular systolic function is normal. The right ventricular size is normal. There is normal pulmonary artery systolic pressure.  3. Left atrial size was mildly dilated.  4. The mitral valve is normal in structure. Trivial mitral valve regurgitation. No evidence of mitral stenosis.  5. The aortic valve is normal in structure. Aortic valve regurgitation is not visualized. No aortic stenosis is present.  6. The inferior vena cava is normal in size with greater than 50% respiratory variability, suggesting right atrial pressure of 3 mmHg. FINDINGS  Left Ventricle: Left ventricular ejection fraction, by estimation, is 60 to 65%. The left ventricle has normal function. The left ventricle has no regional wall motion abnormalities. The left ventricular internal cavity size was normal in size. There is  moderate left ventricular hypertrophy. Left ventricular diastolic parameters are consistent with Grade I diastolic dysfunction (impaired relaxation). Right Ventricle: The right ventricular size is normal. No increase in right ventricular wall thickness. Right ventricular systolic function is normal. There is normal pulmonary artery systolic pressure. The tricuspid regurgitant velocity is 2.59 m/s, and  with an assumed right atrial pressure of 3 mmHg, the estimated right ventricular systolic pressure is 29.8 mmHg. Left Atrium: Left atrial size was mildly dilated. Right Atrium: Right atrial size was normal in size. Pericardium: There is no evidence of  pericardial effusion. Mitral Valve: The mitral valve is normal in structure. Trivial mitral valve regurgitation. No evidence of mitral valve stenosis. Tricuspid Valve: The tricuspid valve is normal in structure. Tricuspid valve regurgitation is trivial. No evidence of tricuspid stenosis. Aortic Valve: The aortic valve is normal in structure. Aortic valve regurgitation is not visualized. No aortic stenosis is present. Pulmonic Valve: The pulmonic valve was normal in structure. Pulmonic valve regurgitation is not visualized. No evidence of pulmonic stenosis. Aorta: The aortic root is normal in size and structure. Venous: The inferior vena cava is normal in size with greater than 50% respiratory variability, suggesting right atrial pressure of 3 mmHg. IAS/Shunts: No atrial level shunt detected by color flow Doppler.  LEFT VENTRICLE PLAX 2D LVIDd:         5.50 cm  Diastology LVIDs:         3.60 cm  LV e' medial:    7.72 cm/s LV PW:         1.40 cm  LV E/e' medial:  8.7 LV IVS:        1.00 cm  LV e' lateral:   8.16 cm/s LVOT diam:     2.30 cm  LV E/e' lateral: 8.2 LV SV:         69 LV SV Index:   26 LVOT Area:     4.15 cm  RIGHT VENTRICLE             IVC RV S prime:     11.50 cm/s  IVC diam: 1.90 cm TAPSE (M-mode): 1.8 cm LEFT ATRIUM             Index       RIGHT ATRIUM           Index LA diam:        4.40 cm 1.70 cm/m  RA Area:  12.30 cm LA Vol (A2C):   64.6 ml 24.92 ml/m RA Volume:   27.00 ml  10.42 ml/m LA Vol (A4C):   62.8 ml 24.23 ml/m LA Biplane Vol: 69.1 ml 26.66 ml/m  AORTIC VALVE LVOT Vmax:   99.20 cm/s LVOT Vmean:  63.000 cm/s LVOT VTI:    0.165 m  AORTA Ao Root diam: 3.80 cm Ao Asc diam:  3.40 cm MITRAL VALVE               TRICUSPID VALVE MV Area (PHT): 4.06 cm    TR Peak grad:   26.8 mmHg MV Decel Time: 187 msec    TR Vmax:        259.00 cm/s MV E velocity: 67.30 cm/s MV A velocity: 50.60 cm/s  SHUNTS MV E/A ratio:  1.33        Systemic VTI:  0.16 m                            Systemic Diam: 2.30 cm  Donato Schultz MD Electronically signed by Donato Schultz MD Signature Date/Time: 10/31/2020/1:37:15 PM    Final     Cardiac Studies   TTE  1. Left ventricular ejection fraction, by estimation, is 60 to 65%. The  left ventricle has normal function. The left ventricle has no regional  wall motion abnormalities. There is moderate left ventricular hypertrophy.  Left ventricular diastolic  parameters are consistent with Grade I diastolic dysfunction (impaired  relaxation).  2. Right ventricular systolic function is normal. The right ventricular  size is normal. There is normal pulmonary artery systolic pressure.  3. Left atrial size was mildly dilated.  4. The mitral valve is normal in structure. Trivial mitral valve  regurgitation. No evidence of mitral stenosis.  5. The aortic valve is normal in structure. Aortic valve regurgitation is  not visualized. No aortic stenosis is present.  6. The inferior vena cava is normal in size with greater than 50%  respiratory variability, suggesting right atrial pressure of 3 mmHg.   Patient Profile     40 y.o. male who presented to the hospital yesterday with chest pain, thought to have pericarditis  Assessment & Plan    1.  Pericarditis: Fortunately his echo shows a normal ejection fraction with no pericardial effusion.  He does not show signs of volume overload on his echo.  His pericarditis could be due to a viral illness, he recently had Covid.  He was started on colchicine yesterday and feels much improved without chest pain.  At this point, no further cardiac work-up is needed.  Would discharge on colchicine and Debanhi Blaker arrange for follow-up in cardiology clinic.  [facility] Kashton Mcartor sign off.   Medication Recommendations: Colchicine 0.6 mg daily Other recommendations (labs, testing, etc): None Follow up as an outpatient: To be arranged in cardiology clinic  For questions or updates, please contact [facility] Please consult [email or web address]  for contact info under        Signed, Gaila Engebretsen Jorja Loa, MD  11/01/2020, 11:39 AM

## 2020-11-01 NOTE — Progress Notes (Signed)
I have sent a message to our office's scheduling team requesting a follow-up appointment, and our office will call the patient with this information.  Dayna Dunn PA-C  

## 2020-11-02 ENCOUNTER — Ambulatory Visit: Payer: 59 | Admitting: Physician Assistant

## 2020-11-02 LAB — BASIC METABOLIC PANEL
Anion gap: 12 (ref 5–15)
BUN: 11 mg/dL (ref 6–20)
CO2: 21 mmol/L — ABNORMAL LOW (ref 22–32)
Calcium: 8.3 mg/dL — ABNORMAL LOW (ref 8.9–10.3)
Chloride: 103 mmol/L (ref 98–111)
Creatinine, Ser: 1.17 mg/dL (ref 0.61–1.24)
GFR, Estimated: >60 mL/min (ref >60)
Glucose, Bld: 118 mg/dL — ABNORMAL HIGH (ref 70–99)
Potassium: 3.5 mmol/L (ref 3.5–5.1)
Sodium: 136 mmol/L (ref 135–145)

## 2020-11-02 LAB — CBC
HCT: 38.9 % — ABNORMAL LOW (ref 39.0–52.0)
Hemoglobin: 13.1 g/dL (ref 13.0–17.0)
MCH: 29.2 pg (ref 26.0–34.0)
MCHC: 33.7 g/dL (ref 30.0–36.0)
MCV: 86.6 fL (ref 80.0–100.0)
Platelets: 250 10*3/uL (ref 150–400)
RBC: 4.49 MIL/uL (ref 4.22–5.81)
RDW: 13.9 % (ref 11.5–15.5)
WBC: 15.9 10*3/uL — ABNORMAL HIGH (ref 4.0–10.5)
nRBC: 0 % (ref 0.0–0.2)

## 2020-11-02 LAB — PROCALCITONIN: Procalcitonin: 2.76 ng/mL

## 2020-11-02 NOTE — Discharge Summary (Signed)
Physician Discharge Summary  Nathaniel Jones:[id number] DOB: 1981/07/25 DOA: 10/31/2020  PCP: Nathaniel Jones  Admit date: 10/31/2020 Discharge date: 11/02/2020  Admitted From: Home Disposition: Home  Recommendations for Outpatient Follow-up:  1. Follow up with PCP in 1-2 weeks 2. Cardiology will schedule follow-up with you.  Home Health: Not applicable Equipment/Devices: None needed  Discharge Condition: Stable CODE STATUS: Full code Diet recommendation: Low-salt diet  Discharge summary:  40 year old gentleman with history of pericarditis, hypertension, obstructive sleep apnea, recent COVID-19 infection on 12/31/ 2021 presented to the ER with headache, chills and myalgia on 10/27/20.  Had negative Covid and influenza.  Came back to ER on 1/27 with some neck pain.  Spinal tap negative.  Chest x-ray suspicious for pneumonia.  Discharged home on Augmentin.  Came back very next day with left precordial chest pain and low-grade fever.  Mild cough but no sputum production.  Suspected of pericarditis, CT of the chest negative for PE, possible consolidation right mid upper lobe.  Continue to have low-grade fever.   Community-acquired pneumonia/post Covid inflammatory syndrome: Blood cultures negative.  Symptomatically improving.  Procalcitonin greater than 4.  CRP 40.  Repeated COVID-19 PCR negative. Echocardiogram normal.  Suspected post viral pleuritic chest pain.  No evidence of pericardial effusion. This could be community-acquired pneumonia or more likely post viral inflammatory syndrome.  He was treated with Rocephin and azithromycin for 3 days in the hospital.  He will resume Augmentin and complete remaining course.  He will receive total 10 days of antibiotics treatment. Patient is on allopurinol for gout, he has colchicine as needed.  He will take colchicine for next 2 weeks.  Rest of the medical issues including hypertension remains a stable.  He will resume all his home  medications.  Patient will need repeat chest x-ray in 4 to 6 weeks after completion of antibiotic therapy to ensure resolution of right middle lobe abnormality. Since he continues to have low-grade fever and weakness, he will stay out of work for 2 weeks.   Discharge Diagnoses:  Active Problems:   Obesity   CAP (community acquired pneumonia)   Pericarditis, viral   Essential hypertension   OSA (obstructive sleep apnea)    Discharge Instructions  Discharge Instructions    Call Jones for:  difficulty breathing, headache or visual disturbances   Complete by: As directed    Diet - low sodium heart healthy   Complete by: As directed    Increase activity slowly   Complete by: As directed      Allergies as of 11/02/2020      Reactions   Coconut Oil Other (See Comments)   Facial burning   Ibuprofen Nausea And Vomiting   Reaction to large doses      Medication List    STOP taking these medications   ondansetron 4 MG disintegrating tablet Commonly known as: Zofran ODT     TAKE these medications   acetaminophen 500 MG tablet Commonly known as: TYLENOL Take 1,000 mg by mouth 3 (three) times daily as needed for fever or headache (pain).   allopurinol 100 MG tablet Commonly known as: ZYLOPRIM Take 100 mg by mouth daily as needed (gout attacks).   amoxicillin-clavulanate 875-125 MG tablet Commonly known as: AUGMENTIN Take 1 tablet by mouth every 12 (twelve) hours.   Biotin 1000 MCG tablet Take 1,000 mcg by mouth daily.   colchicine 0.6 MG tablet Take 0.6 mg by mouth daily as needed (gout attacks).   Collagen Ultra Caps  Take 1 capsule by mouth daily.   Cranberry 300 MG tablet Take 300 mg by mouth daily.   cyclobenzaprine 5 MG tablet Commonly known as: FLEXERIL Take 1 tablet (5 mg total) by mouth 3 (three) times daily as needed.   Fish Oil 1000 MG Caps Take 1,000 mg by mouth daily.   Glucosamine-Chondroitin 500-400 MG Caps Take 1 capsule by mouth daily.    ibuprofen 200 MG tablet Commonly known as: ADVIL Take 400 mg by mouth every 6 (six) hours as needed for fever or headache (pain).   Iron 325 (65 Fe) MG Tabs Take 325 mg by mouth daily.   Lysine 500 MG Caps Take 500 mg by mouth daily.   multivitamin with minerals Tabs tablet Take 1 tablet by mouth daily.   nabumetone 750 MG tablet Commonly known as: RELAFEN Take 1 tablet (750 mg total) by mouth 2 (two) times daily as needed.   pyridOXINE 100 MG tablet Commonly known as: VITAMIN B-6 Take 100 mg by mouth daily.   QUEtiapine 25 MG tablet Commonly known as: SEROQUEL Take 12.5 mg by mouth at bedtime as needed (sleep).   Saw Palmetto Extract 160 MG Caps Take 160 mg by mouth daily.   sildenafil 100 MG tablet Commonly known as: VIAGRA Take 100 mg by mouth daily as needed for erectile dysfunction.   Tart Cherry Advanced Caps Take 1 capsule by mouth daily.   Turmeric 500 MG Caps Take 500 mg by mouth daily.   vitamin B-12 1000 MCG tablet Commonly known as: CYANOCOBALAMIN Take 1,000 mcg by mouth daily.       Follow-up Information    Nathaniel Jones Follow up.   Specialty: Cardiology Why: [facility] - Dr. [name] office will call you to arrange follow-up. Contact information: [address] [place] [address] [phone]        Nathaniel Jones Follow up in 1 week(s).   Specialty: Family Medicine Contact information: [address] [place] [address] [phone]              Allergies  Allergen Reactions  . Coconut Oil Other (See Comments)    Facial burning  . Ibuprofen Nausea And Vomiting    Reaction to large doses    Consultations:  Cardiology   Procedures/Studies: Epidural Steroid injection  Result Date: 10/15/2020 Nathaniel Jones     10/16/2020  5:10 AM Lumbosacral Transforaminal Epidural Steroid Injection - Sub-Pedicular Approach with Fluoroscopic Guidance Patient: Nathaniel Jones     Date  of Birth: 08/07/1981 MRN: [id number] PCP: Nathaniel Jones     Visit Date: 10/15/2020  Universal Protocol:   Date/Time: 10/15/2020 Consent Given By: the patient Position: PRONE Additional Comments: Vital signs were monitored before and after the procedure. Patient was prepped and draped in the usual sterile fashion. The correct patient, procedure, and site was verified. Injection Procedure Details: Procedure diagnoses: Lumbar radiculopathy [M54.16]  Meds Administered: Meds ordered this encounter Medications . methylPREDNISolone acetate (DEPO-MEDROL) injection 80 mg Laterality: Left Location/Site: L5-S1 Needle:5.0 in., 22 ga.  Short bevel or Quincke spinal needle Needle Placement: Transforaminal Findings:   -Comments: Excellent flow of contrast along the nerve, nerve root and into the epidural space. Procedure Details: After squaring off the end-plates to get a true AP view, the C-arm was positioned so that an oblique view of the foramen as noted above was visualized. The target area is just inferior to the "nose of the scotty dog" or sub  pedicular. The soft tissues overlying this structure were infiltrated with 2-3 ml. of 1% Lidocaine without Epinephrine. The spinal needle was inserted toward the target using a "trajectory" view along the fluoroscope beam.  Under AP and lateral visualization, the needle was advanced so it did not puncture dura and was located close the 6 O'Clock position of the pedical in AP tracterory. Biplanar projections were used to confirm position. Aspiration was confirmed to be negative for CSF and/or blood. A 1-2 ml. volume of Isovue-250 was injected and flow of contrast was noted at each level. Radiographs were obtained for documentation purposes. After attaining the desired flow of contrast documented above, a 0.5 to 1.0 ml test dose of 0.25% Marcaine was injected into each respective transforaminal space.  The patient was observed for 90 seconds post injection.  After no sensory  deficits were reported, and normal lower extremity motor function was noted,   the above injectate was administered so that equal amounts of the injectate were placed at each foramen (level) into the transforaminal epidural space. Additional Comments: The patient tolerated the procedure well Dressing: 2 x 2 sterile gauze and Band-Aid  Post-procedure details: Patient was observed during the procedure. Post-procedure instructions were reviewed. Patient left the clinic in stable condition.   DG Chest 2 View  Result Date: 10/31/2020 CLINICAL DATA:  Left chest pain, shortness of breath and diaphoresis. EXAM: CHEST - 2 VIEW COMPARISON:  10/29/2020 FINDINGS: Improved inspiration. Normal sized heart. Decreased inferior right perihilar airspace opacity. Resolved left perihilar airspace opacity. Decreased prominence of the interstitial markings. No pleural fluid. Unremarkable bones. IMPRESSION: Improved inspiration with decreased right perihilar and resolved left perihilar atelectasis, pneumonia or edema. Electronically Signed   By: Beckie Salts M.D.   On: 10/31/2020 09:54   CT Angio Chest PE W/Cm &/Or Wo Cm  Result Date: 10/31/2020 CLINICAL DATA:  PE suspected. High probability. Sharp LEFT-sided chest pain. EXAM: CT ANGIOGRAPHY CHEST WITH CONTRAST TECHNIQUE: Multidetector CT imaging of the chest was performed using the standard protocol during bolus administration of intravenous contrast. Multiplanar CT image reconstructions and MIPs were obtained to evaluate the vascular anatomy. CONTRAST:  OMNIPAQUE IOHEXOL 350 MG/ML SOLN COMPARISON:  Chest radiograph 10/31/2020 FINDINGS: Cardiovascular: No pulmonary embolism within the proximal main pulmonary arteries. There is a mottled appearance of the lower lobe pulmonary arteries. No clear evidence of continuous tubular defects. Cannot exclude small pulmonary emboli within the lower lobe pulmonary arteries. Exam degraded by patient body habitus. Mediastinum/Nodes: No  axillary or supraclavicular adenopathy. No mediastinal or hilar adenopathy. No pericardial fluid. Esophagus normal. Lungs/Pleura: Rounded consolidation in the superior segment of the RIGHT lower lobe adjacent to the hilum measures 3.1 by 3.1 cm (image 71/7). Mild segmental ground-glass densities distal to this consolidation. Upper Abdomen: Limited view of the liver, kidneys, pancreas are unremarkable. Normal adrenal glands. Musculoskeletal: No aggressive osseous lesion. Review of the MIP images confirms the above findings. IMPRESSION: 1. No convincing evidence of acute pulmonary embolism. Mottled densities in lower lobe pulmonary arteries favored related to patient body habitus. Cannot exclude small distal pulmonary emboli in lower lobes. 2. Rounded consolidation in the superior segment RIGHT lower lobe is favored focus of pneumonia. Recommend follow-up CT after appropriate antibiotic therapy to ensure resolution Electronically Signed   By: Genevive Bi M.D.   On: 10/31/2020 10:36   DG Chest Port 1 View  Result Date: 10/29/2020 CLINICAL DATA:  Headache, fevers, neck pain and stiffness since Saturday. Negative COVID test. EXAM: PORTABLE CHEST 1 VIEW  COMPARISON:  09/06/2009 FINDINGS: Shallow inspiration. Cardiac enlargement. Perihilar infiltrates suggesting edema or pneumonia. No pleural effusions. No pneumothorax. Mediastinal contours appear intact. IMPRESSION: Cardiac enlargement with perihilar infiltrates suggesting edema or pneumonia. Electronically Signed   By: Burman Nieves M.D.   On: 10/29/2020 02:01   ECHOCARDIOGRAM COMPLETE  Result Date: 10/31/2020    ECHOCARDIOGRAM REPORT   Patient Name:   ARNAV CREGG Date of Exam: 10/31/2020 Medical Rec #:  [id number]    Height:       75.0 in Accession #:    [id number]   Weight:       296.0 lb Date of Birth:  May 16, 1981    BSA:          2.592 m Patient Age:    39 years     BP:           117/75 mmHg Patient Gender: M            HR:           91 bpm. Exam  Location:  Inpatient Procedure: 2D Echo Indications:    elevated troponin  History:        Patient has prior history of Echocardiogram examinations, most                 recent 08/04/2017. Pericarditis; Risk Factors:Sleep Apnea.  Sonographer:    Delcie Roch Referring Phys: [id number] DAYNA N DUNN IMPRESSIONS  1. Left ventricular ejection fraction, by estimation, is 60 to 65%. The left ventricle has normal function. The left ventricle has no regional wall motion abnormalities. There is moderate left ventricular hypertrophy. Left ventricular diastolic parameters are consistent with Grade I diastolic dysfunction (impaired relaxation).  2. Right ventricular systolic function is normal. The right ventricular size is normal. There is normal pulmonary artery systolic pressure.  3. Left atrial size was mildly dilated.  4. The mitral valve is normal in structure. Trivial mitral valve regurgitation. No evidence of mitral stenosis.  5. The aortic valve is normal in structure. Aortic valve regurgitation is not visualized. No aortic stenosis is present.  6. The inferior vena cava is normal in size with greater than 50% respiratory variability, suggesting right atrial pressure of 3 mmHg. FINDINGS  Left Ventricle: Left ventricular ejection fraction, by estimation, is 60 to 65%. The left ventricle has normal function. The left ventricle has no regional wall motion abnormalities. The left ventricular internal cavity size was normal in size. There is  moderate left ventricular hypertrophy. Left ventricular diastolic parameters are consistent with Grade I diastolic dysfunction (impaired relaxation). Right Ventricle: The right ventricular size is normal. No increase in right ventricular wall thickness. Right ventricular systolic function is normal. There is normal pulmonary artery systolic pressure. The tricuspid regurgitant velocity is 2.59 m/s, and  with an assumed right atrial pressure of 3 mmHg, the estimated right ventricular  systolic pressure is 29.8 mmHg. Left Atrium: Left atrial size was mildly dilated. Right Atrium: Right atrial size was normal in size. Pericardium: There is no evidence of pericardial effusion. Mitral Valve: The mitral valve is normal in structure. Trivial mitral valve regurgitation. No evidence of mitral valve stenosis. Tricuspid Valve: The tricuspid valve is normal in structure. Tricuspid valve regurgitation is trivial. No evidence of tricuspid stenosis. Aortic Valve: The aortic valve is normal in structure. Aortic valve regurgitation is not visualized. No aortic stenosis is present. Pulmonic Valve: The pulmonic valve was normal in structure. Pulmonic valve regurgitation is not visualized. No evidence of pulmonic stenosis. Aorta:  The aortic root is normal in size and structure. Venous: The inferior vena cava is normal in size with greater than 50% respiratory variability, suggesting right atrial pressure of 3 mmHg. IAS/Shunts: No atrial level shunt detected by color flow Doppler.  LEFT VENTRICLE PLAX 2D LVIDd:         5.50 cm  Diastology LVIDs:         3.60 cm  LV e' medial:    7.72 cm/s LV PW:         1.40 cm  LV E/e' medial:  8.7 LV IVS:        1.00 cm  LV e' lateral:   8.16 cm/s LVOT diam:     2.30 cm  LV E/e' lateral: 8.2 LV SV:         69 LV SV Index:   26 LVOT Area:     4.15 cm  RIGHT VENTRICLE             IVC RV S prime:     11.50 cm/s  IVC diam: 1.90 cm TAPSE (M-mode): 1.8 cm LEFT ATRIUM             Index       RIGHT ATRIUM           Index LA diam:        4.40 cm 1.70 cm/m  RA Area:     12.30 cm LA Vol (A2C):   64.6 ml 24.92 ml/m RA Volume:   27.00 ml  10.42 ml/m LA Vol (A4C):   62.8 ml 24.23 ml/m LA Biplane Vol: 69.1 ml 26.66 ml/m  AORTIC VALVE LVOT Vmax:   99.20 cm/s LVOT Vmean:  63.000 cm/s LVOT VTI:    0.165 m  AORTA Ao Root diam: 3.80 cm Ao Asc diam:  3.40 cm MITRAL VALVE               TRICUSPID VALVE MV Area (PHT): 4.06 cm    TR Peak grad:   26.8 mmHg MV Decel Time: 187 msec    TR Vmax:         259.00 cm/s MV E velocity: 67.30 cm/s MV A velocity: 50.60 cm/s  SHUNTS MV E/A ratio:  1.33        Systemic VTI:  0.16 m                            Systemic Diam: 2.30 cm Donato Schultz Jones Electronically signed by Donato Schultz Jones Signature Date/Time: 10/31/2020/1:37:15 PM    Final    XR C-ARM NO REPORT  Result Date: 10/15/2020 Please see Notes tab for imaging impression.  (Echo, Carotid, EGD, Colonoscopy, ERCP)    Subjective: Patient seen and examined.  Feels somehow weak otherwise denies any complaints.  Has occasional dry cough but no sputum production.  Temperature 100.1 over last 24 hours. Denies any chest pain.  Denies any wheezing.   Discharge Exam: Vitals:   11/02/20 0515 11/02/20 1043  BP: 139/87 124/80  Pulse: 95 93  Resp: 20 20  Temp: 100.1 F (37.8 C) 100.2 F (37.9 C)  SpO2: 97% 97%   Vitals:   11/01/20 1836 11/01/20 2012 11/02/20 0515 11/02/20 1043  BP: (!) 148/94 139/84 139/87 124/80  Pulse: (!) 101 98 95 93  Resp: 19 20 20 20   Temp: 99.7 F (37.6 C) 99.3 F (37.4 C) 100.1 F (37.8 C) 100.2 F (37.9 C)  TempSrc: Oral  Oral  SpO2: 96% 98% 97% 97%  Weight:      Height:        General: Pt is alert, awake, not in acute distress Walking around the hallway on room air. Cardiovascular: RRR, S1/S2 +, no rubs, no gallops Respiratory: CTA bilaterally, no wheezing, no rhonchi Abdominal: Soft, NT, ND, bowel sounds + Extremities: no edema, no cyanosis    The results of significant diagnostics from this hospitalization (including imaging, microbiology, ancillary and laboratory) are listed below for reference.     Microbiology: Recent Results (from the past 240 hour(s))  SARS CORONAVIRUS 2 (TAT 6-24 HRS) Nasopharyngeal Nasopharyngeal Swab     Status: None   Collection Time: 10/25/20  7:06 PM   Specimen: Nasopharyngeal Swab  Result Value Ref Range Status   SARS Coronavirus 2 NEGATIVE NEGATIVE Final    Comment: (NOTE) SARS-CoV-2 target nucleic acids are NOT  DETECTED.  The SARS-CoV-2 RNA is generally detectable in upper and lower respiratory specimens during the acute phase of infection. Negative results do not preclude SARS-CoV-2 infection, do not rule out co-infections with other pathogens, and should not be used as the sole basis for treatment or other patient management decisions. Negative results must be combined with clinical observations, patient history, and epidemiological information. The expected result is Negative.  Fact Sheet for Patients: HairSlick.no  Fact Sheet for Healthcare Providers: quierodirigir.com  This test is not yet approved or cleared by the [place] FDA and  has been authorized for detection and/or diagnosis of SARS-CoV-2 by FDA under an Emergency Use Authorization (EUA). This EUA will remain  in effect (meaning this test can be used) for the duration of the COVID-19 declaration under Se ction 564(b)(1) of the Act, 21 U.S.C. section 360bbb-3(b)(1), unless the authorization is terminated or revoked sooner.  Performed at [facility], [address] [place], [place] [address]   SARS CORONAVIRUS 2 (TAT 6-24 HRS) Nasopharyngeal Nasopharyngeal Swab     Status: None   Collection Time: 10/27/20  5:50 PM   Specimen: Nasopharyngeal Swab  Result Value Ref Range Status   SARS Coronavirus 2 NEGATIVE NEGATIVE Final    Comment: (NOTE) SARS-CoV-2 target nucleic acids are NOT DETECTED.  The SARS-CoV-2 RNA is generally detectable in upper and lower respiratory specimens during the acute phase of infection. Negative results do not preclude SARS-CoV-2 infection, do not rule out co-infections with other pathogens, and should not be used as the sole basis for treatment or other patient management decisions. Negative results must be combined with clinical observations, patient history, and epidemiological information. The expected result is Negative.  Fact  Sheet for Patients: HairSlick.no  Fact Sheet for Healthcare Providers: quierodirigir.com  This test is not yet approved or cleared by the [place] FDA and  has been authorized for detection and/or diagnosis of SARS-CoV-2 by FDA under an Emergency Use Authorization (EUA). This EUA will remain  in effect (meaning this test can be used) for the duration of the COVID-19 declaration under Se ction 564(b)(1) of the Act, 21 U.S.C. section 360bbb-3(b)(1), unless the authorization is terminated or revoked sooner.  Performed at [facility], [address], [place], [place] [address]   SARS Coronavirus 2 by RT PCR (hospital order, performed in [facility] hospital lab) Nasopharyngeal Nasopharyngeal Swab     Status: None   Collection Time: 10/29/20 12:53 AM   Specimen: Nasopharyngeal Swab  Result Value Ref Range Status   SARS Coronavirus 2 NEGATIVE NEGATIVE Final    Comment: (NOTE)  SARS-CoV-2 target nucleic acids are NOT DETECTED.  The SARS-CoV-2 RNA is generally detectable in upper and lower respiratory specimens during the acute phase of infection. The lowest concentration of SARS-CoV-2 viral copies this assay can detect is 250 copies / mL. A negative result does not preclude SARS-CoV-2 infection and should not be used as the sole basis for treatment or other patient management decisions.  A negative result may occur with improper specimen collection / handling, submission of specimen other than nasopharyngeal swab, presence of viral mutation(s) within the areas targeted by this assay, and inadequate number of viral copies (<250 copies / mL). A negative result must be combined with clinical observations, patient history, and epidemiological information.  Fact Sheet for Patients:   BoilerBrush.com.cy  Fact Sheet for Healthcare Providers: https://pope.com/  This test is not  yet approved or  cleared by the [place] FDA and has been authorized for detection and/or diagnosis of SARS-CoV-2 by FDA under an Emergency Use Authorization (EUA).  This EUA will remain in effect (meaning this test can be used) for the duration of the COVID-19 declaration under Section 564(b)(1) of the Act, 21 U.S.C. section 360bbb-3(b)(1), unless the authorization is terminated or revoked sooner.  Performed at [facility], [address] [place], [place] [address]   CSF culture     Status: None   Collection Time: 10/29/20  2:58 AM   Specimen: CSF; Cerebrospinal Fluid  Result Value Ref Range Status   Specimen Description CSF  Final   Special Requests NONE  Final   Gram Stain   Final    WBC PRESENT, PREDOMINANTLY MONONUCLEAR NO ORGANISMS SEEN CYTOSPIN SMEAR    Culture   Final    NO GROWTH 3 DAYS Performed at [facility], [address], [place], [place] [address]    Report Status 11/01/2020 FINAL  Final  SARS Coronavirus 2 by RT PCR (hospital order, performed in [facility] hospital lab) Nasopharyngeal Nasopharyngeal Swab     Status: None   Collection Time: 10/31/20 11:24 AM   Specimen: Nasopharyngeal Swab  Result Value Ref Range Status   SARS Coronavirus 2 NEGATIVE NEGATIVE Final    Comment: (NOTE) SARS-CoV-2 target nucleic acids are NOT DETECTED.  The SARS-CoV-2 RNA is generally detectable in upper and lower respiratory specimens during the acute phase of infection. The lowest concentration of SARS-CoV-2 viral copies this assay can detect is 250 copies / mL. A negative result does not preclude SARS-CoV-2 infection and should not be used as the sole basis for treatment or other patient management decisions.  A negative result may occur with improper specimen collection / handling, submission of specimen other than nasopharyngeal swab, presence of viral mutation(s) within the areas targeted by this assay, and inadequate number of viral copies (<250  copies / mL). A negative result must be combined with clinical observations, patient history, and epidemiological information.  Fact Sheet for Patients:   BoilerBrush.com.cy  Fact Sheet for Healthcare Providers: https://pope.com/  This test is not yet approved or  cleared by the [place] FDA and has been authorized for detection and/or diagnosis of SARS-CoV-2 by FDA under an Emergency Use Authorization (EUA).  This EUA will remain in effect (meaning this test can be used) for the duration of the COVID-19 declaration under Section 564(b)(1) of the Act, 21 U.S.C. section 360bbb-3(b)(1), unless the authorization is terminated or revoked sooner.  Performed at [facility], [address], [place], [place] [address]   Culture, blood (  Routine X 2) w Reflex to ID Panel     Status: None (Preliminary result)   Collection Time: 10/31/20 12:45 PM   Specimen: BLOOD RIGHT HAND  Result Value Ref Range Status   Specimen Description BLOOD RIGHT HAND  Final   Special Requests   Final    BOTTLES DRAWN AEROBIC AND ANAEROBIC Blood Culture adequate volume   Culture   Final    NO GROWTH 2 DAYS Performed at [facility], [address], [place], [place] [address]    Report Status PENDING  Incomplete     Labs: BNP (last 3 results) Recent Labs    10/31/20 1006  BNP 549.0*   Basic Metabolic Panel: Recent Labs  Lab 10/28/20 2245 10/31/20 0926 10/31/20 1246 11/01/20 0435 11/02/20 0342  NA 132* 135  --  136 136  K 3.5 3.3*  --  4.3 3.5  CL 97* 102  --  105 103  CO2 23 22  --  18* 21*  GLUCOSE 143* 175*  --  106* 118*  BUN 10 14  --  10 11  CREATININE 1.13 1.23  --  1.07 1.17  CALCIUM 8.8* 8.5*  --  8.2* 8.3*  MG  --   --  2.5*  --   --    Liver Function Tests: Recent Labs  Lab 10/29/20 0026 10/31/20 1246  AST 48* 34  ALT 46* 39  ALKPHOS 63 75  BILITOT 2.2* 1.1  PROT 7.0 6.9  ALBUMIN 3.1* 2.6*   No results for  input(s): LIPASE, AMYLASE in the last 168 hours. No results for input(s): AMMONIA in the last 168 hours. CBC: Recent Labs  Lab 10/28/20 2245 10/31/20 0926 11/01/20 0435 11/02/20 0342  WBC 11.2* 14.0* 14.5* 15.9*  NEUTROABS  --   --  11.3*  --   HGB 14.5 13.6 12.8* 13.1  HCT 42.2 41.4 39.0 38.9*  MCV 85.9 88.8 88.8 86.6  PLT 186 192 199 250   Cardiac Enzymes: Recent Labs  Lab 10/31/20 1246  CKTOTAL 118   BNP: Invalid input(s): POCBNP CBG: Recent Labs  Lab 10/31/20 0943  GLUCAP 166*   D-Dimer No results for input(s): DDIMER in the last 72 hours. Hgb A1c No results for input(s): HGBA1C in the last 72 hours. Lipid Profile No results for input(s): CHOL, HDL, LDLCALC, TRIG, CHOLHDL, LDLDIRECT in the last 72 hours. Thyroid function studies No results for input(s): TSH, T4TOTAL, T3FREE, THYROIDAB in the last 72 hours.  Invalid input(s): FREET3 Anemia work up No results for input(s): VITAMINB12, FOLATE, FERRITIN, TIBC, IRON, RETICCTPCT in the last 72 hours. Urinalysis    Component Value Date/Time   COLORURINE YELLOW 10/31/2020 1837   APPEARANCEUR CLEAR 10/31/2020 1837   LABSPEC 1.026 10/31/2020 1837   PHURINE 6.0 10/31/2020 1837   GLUCOSEU NEGATIVE 10/31/2020 1837   HGBUR NEGATIVE 10/31/2020 1837   BILIRUBINUR NEGATIVE 10/31/2020 1837   KETONESUR NEGATIVE 10/31/2020 1837   PROTEINUR 30 (A) 10/31/2020 1837   UROBILINOGEN 0.2 09/27/2016 1453   NITRITE NEGATIVE 10/31/2020 1837   LEUKOCYTESUR NEGATIVE 10/31/2020 1837   Sepsis Labs Invalid input(s): PROCALCITONIN,  WBC,  LACTICIDVEN Microbiology Recent Results (from the past 240 hour(s))  SARS CORONAVIRUS 2 (TAT 6-24 HRS) Nasopharyngeal Nasopharyngeal Swab     Status: None   Collection Time: 10/25/20  7:06 PM   Specimen: Nasopharyngeal Swab  Result Value Ref Range Status   SARS Coronavirus 2 NEGATIVE NEGATIVE Final    Comment: (NOTE) SARS-CoV-2 target nucleic acids are NOT  DETECTED.  The SARS-CoV-2 RNA is  generally detectable in upper and lower respiratory specimens during the acute phase of infection. Negative results do not preclude SARS-CoV-2 infection, do not rule out co-infections with other pathogens, and should not be used as the sole basis for treatment or other patient management decisions. Negative results must be combined with clinical observations, patient history, and epidemiological information. The expected result is Negative.  Fact Sheet for Patients: HairSlick.nohttps://www.fda.gov/media/138098/download  Fact Sheet for Healthcare Providers: quierodirigir.comhttps://www.fda.gov/media/138095/download  This test is not yet approved or cleared by the [place] FDA and  has been authorized for detection and/or diagnosis of SARS-CoV-2 by FDA under an Emergency Use Authorization (EUA). This EUA will remain  in effect (meaning this test can be used) for the duration of the COVID-19 declaration under Se ction 564(b)(1) of the Act, 21 U.S.C. section 360bbb-3(b)(1), unless the authorization is terminated or revoked sooner.  Performed at [facility], [address] [place], [place] [address]   SARS CORONAVIRUS 2 (TAT 6-24 HRS) Nasopharyngeal Nasopharyngeal Swab     Status: None   Collection Time: 10/27/20  5:50 PM   Specimen: Nasopharyngeal Swab  Result Value Ref Range Status   SARS Coronavirus 2 NEGATIVE NEGATIVE Final    Comment: (NOTE) SARS-CoV-2 target nucleic acids are NOT DETECTED.  The SARS-CoV-2 RNA is generally detectable in upper and lower respiratory specimens during the acute phase of infection. Negative results do not preclude SARS-CoV-2 infection, do not rule out co-infections with other pathogens, and should not be used as the sole basis for treatment or other patient management decisions. Negative results must be combined with clinical observations, patient history, and epidemiological information. The expected result is Negative.  Fact Sheet for  Patients: HairSlick.nohttps://www.fda.gov/media/138098/download  Fact Sheet for Healthcare Providers: quierodirigir.comhttps://www.fda.gov/media/138095/download  This test is not yet approved or cleared by the [place] FDA and  has been authorized for detection and/or diagnosis of SARS-CoV-2 by FDA under an Emergency Use Authorization (EUA). This EUA will remain  in effect (meaning this test can be used) for the duration of the COVID-19 declaration under Se ction 564(b)(1) of the Act, 21 U.S.C. section 360bbb-3(b)(1), unless the authorization is terminated or revoked sooner.  Performed at [facility], [address] [place], [place] [address]   SARS Coronavirus 2 by RT PCR (hospital order, performed in [facility] hospital lab) Nasopharyngeal Nasopharyngeal Swab     Status: None   Collection Time: 10/29/20 12:53 AM   Specimen: Nasopharyngeal Swab  Result Value Ref Range Status   SARS Coronavirus 2 NEGATIVE NEGATIVE Final    Comment: (NOTE) SARS-CoV-2 target nucleic acids are NOT DETECTED.  The SARS-CoV-2 RNA is generally detectable in upper and lower respiratory specimens during the acute phase of infection. The lowest concentration of SARS-CoV-2 viral copies this assay can detect is 250 copies / mL. A negative result does not preclude SARS-CoV-2 infection and should not be used as the sole basis for treatment or other patient management decisions.  A negative result may occur with improper specimen collection / handling, submission of specimen other than nasopharyngeal swab, presence of viral mutation(s) within the areas targeted by this assay, and inadequate number of viral copies (<250 copies / mL). A negative result must be combined with clinical observations, patient history, and epidemiological information.  Fact Sheet for Patients:   BoilerBrush.com.cyhttps://www.fda.gov/media/136312/download  Fact Sheet for Healthcare Providers: https://pope.com/https://www.fda.gov/media/136313/download  This test is not yet  approved or  cleared by the [place] FDA  and has been authorized for detection and/or diagnosis of SARS-CoV-2 by FDA under an Emergency Use Authorization (EUA).  This EUA will remain in effect (meaning this test can be used) for the duration of the COVID-19 declaration under Section 564(b)(1) of the Act, 21 U.S.C. section 360bbb-3(b)(1), unless the authorization is terminated or revoked sooner.  Performed at [facility], [address] [place], [place] [address]   CSF culture     Status: None   Collection Time: 10/29/20  2:58 AM   Specimen: CSF; Cerebrospinal Fluid  Result Value Ref Range Status   Specimen Description CSF  Final   Special Requests NONE  Final   Gram Stain   Final    WBC PRESENT, PREDOMINANTLY MONONUCLEAR NO ORGANISMS SEEN CYTOSPIN SMEAR    Culture   Final    NO GROWTH 3 DAYS Performed at [facility], [address] [place], [place] [address]    Report Status 11/01/2020 FINAL  Final  SARS Coronavirus 2 by RT PCR (hospital order, performed in [facility] hospital lab) Nasopharyngeal Nasopharyngeal Swab     Status: None   Collection Time: 10/31/20 11:24 AM   Specimen: Nasopharyngeal Swab  Result Value Ref Range Status   SARS Coronavirus 2 NEGATIVE NEGATIVE Final    Comment: (NOTE) SARS-CoV-2 target nucleic acids are NOT DETECTED.  The SARS-CoV-2 RNA is generally detectable in upper and lower respiratory specimens during the acute phase of infection. The lowest concentration of SARS-CoV-2 viral copies this assay can detect is 250 copies / mL. A negative result does not preclude SARS-CoV-2 infection and should not be used as the sole basis for treatment or other patient management decisions.  A negative result may occur with improper specimen collection / handling, submission of specimen other than nasopharyngeal swab, presence of viral mutation(s) within the areas targeted by this assay, and inadequate number of viral copies (<250 copies /  mL). A negative result must be combined with clinical observations, patient history, and epidemiological information.  Fact Sheet for Patients:   BoilerBrush.com.cy  Fact Sheet for Healthcare Providers: https://pope.com/  This test is not yet approved or  cleared by the [place] FDA and has been authorized for detection and/or diagnosis of SARS-CoV-2 by FDA under an Emergency Use Authorization (EUA).  This EUA will remain in effect (meaning this test can be used) for the duration of the COVID-19 declaration under Section 564(b)(1) of the Act, 21 U.S.C. section 360bbb-3(b)(1), unless the authorization is terminated or revoked sooner.  Performed at [facility], [address] [place], [place] [address]   Culture, blood (Routine X 2) w Reflex to ID Panel     Status: None (Preliminary result)   Collection Time: 10/31/20 12:45 PM   Specimen: BLOOD RIGHT HAND  Result Value Ref Range Status   Specimen Description BLOOD RIGHT HAND  Final   Special Requests   Final    BOTTLES DRAWN AEROBIC AND ANAEROBIC Blood Culture adequate volume   Culture   Final    NO GROWTH 2 DAYS Performed at [facility], [address] [place], [place] [address]    Report Status PENDING  Incomplete     Time coordinating discharge: 32 minutes  SIGNED:   Dorcas Carrow, Jones  [facility] 11/02/2020, 4:39 PM

## 2020-11-02 NOTE — Progress Notes (Signed)
DISCHARGE NOTE HOME Nathaniel Jones to be discharged Home per MD order. Discussed prescriptions and follow up appointments with the patient. Prescriptions given to patient; medication list explained in detail. Patient verbalized understanding.  Skin clean, dry and intact without evidence of skin break down, no evidence of skin tears noted. IV catheter discontinued intact. Site without signs and symptoms of complications. Dressing and pressure applied. Pt denies pain at the site currently. No complaints noted.  Patient free of lines, drains, and wounds.   An After Visit Summary (AVS) was printed and given to the patient. Patient escorted via wheelchair, and discharged home via private auto.  Velia Meyer, RN

## 2020-11-05 ENCOUNTER — Other Ambulatory Visit: Payer: Self-pay | Admitting: Physician Assistant

## 2020-11-05 LAB — CULTURE, BLOOD (ROUTINE X 2)
Culture: NO GROWTH
Special Requests: ADEQUATE

## 2020-11-17 NOTE — Progress Notes (Unsigned)
Cardiology Office Note:    Date:  11/18/2020   ID:  Nathaniel Jones, DOB 1981/08/25, MRN [id number]  PCP:  Asencion Gowda.August Saucer, MD  Cardiologist:  Nicki Guadalajara, MD   Referring MD: Asencion Gowda.August Saucer, MD   Chief Complaint  Patient presents with  . Hospitalization Follow-up    pericarditis    History of Present Illness:    Nathaniel Jones is a 40 y.o. male with a hx of HTN, pericarditis, OSA, prediabetes, and obesity. He had pericarditis in his 39s. Former Therapist, occupational. Nuclear stress test 08/2017 nonischemic. Echo with EF 55% moderate LVH, and mild LAE and mild TR. This testing was done for SOB in the setting of weight gain. He had COVID-19 infection 10/02/20. He was evaluated in [facility] 10/25/20 for fever, HA, chills, and malaise. COVID negative. Flu swab not completed. He returned to [facility] 10/27/20 for ongoing symptoms, still febrile, tachycardic, and hypertensive. He was given toradol and supportive care recommended. He presented to ER 10/29/20 with diaphoresis, fever, chills, HA, neck pain, myalgias, diarrhea, dry cough, and N/V. Labs consistent with lactic acidosis, hyponatremia, and abnormal LFTs. LP excluded meningitis. HA improved with morphine. He was discharged and instructed to follow up with neuro outpatient. He presented back to [facility] 10/31/20 with chest pressure with recumbent position, yawning, and inspiration. HS troponin 147, lactic acid WNL, leukocytosis of 14. CXR with possible PNA. Symptoms felt consistent with pericarditis and colchicine was started. Echo 10/31/20 showed normal EF, grade 1 DD, and no significant valvular disease. No pericardial effusion. He had significant improvement with colchicine and he was discharged the following day.    He returns for follow up. He feels terrible. He has lingering chest pain worse with he is prone or supine, dyspnea on exertion, and significant fatigue. He has finished ABX for PNA and sinusitis. He is breathless after one flight of stairs and taking a  shower leaves him significantly fatigued.    Past Medical History:  Diagnosis Date  . Hypertension   . Low back pain    herniated disc  . Obesity   . OSA (obstructive sleep apnea)    has not yet received CPAP  . Pericarditis    in his 108s    Past Surgical History:  Procedure Laterality Date  . KNEE SURGERY Right     Current Medications: Current Meds  Medication Sig  . acetaminophen (TYLENOL) 500 MG tablet Take 1,000 mg by mouth 3 (three) times daily as needed for fever or headache (pain).  Marland Kitchen allopurinol (ZYLOPRIM) 100 MG tablet Take 100 mg by mouth daily as needed (gout attacks).  . Biotin 1000 MCG tablet Take 1,000 mcg by mouth daily.  . Colchicine 0.6 MG CAPS TAKE ONE CAPSULE EVERY HOUR FOR THREE DOSES THE FIRST DAY THEN ONCE DAILY THEREAFTER  . colchicine 0.6 MG tablet Take 0.6 mg by mouth daily as needed (gout attacks).  . Cranberry 300 MG tablet Take 300 mg by mouth daily.  . cyclobenzaprine (FLEXERIL) 5 MG tablet Take 1 tablet (5 mg total) by mouth 3 (three) times daily as needed.  . Ferrous Sulfate (IRON) 325 (65 Fe) MG TABS Take 325 mg by mouth daily.  . Glucosamine-Chondroitin 500-400 MG CAPS Take 1 capsule by mouth daily.  Marland Kitchen ibuprofen (ADVIL) 200 MG tablet Take 400 mg by mouth every 6 (six) hours as needed for fever or headache (pain).  . Lysine 500 MG CAPS Take 500 mg by mouth daily.  . Misc Natural Products (TART CHERRY ADVANCED)  CAPS Take 1 capsule by mouth daily.  . Multiple Vitamin (MULTIVITAMIN WITH MINERALS) TABS tablet Take 1 tablet by mouth daily.  . nabumetone (RELAFEN) 750 MG tablet Take 1 tablet (750 mg total) by mouth 2 (two) times daily as needed.  . Omega-3 Fatty Acids (FISH OIL) 1000 MG CAPS Take 1,000 mg by mouth daily.  . pantoprazole (PROTONIX) 40 MG tablet Take 1 tablet (40 mg total) by mouth daily.  Marland Kitchen pyridOXINE (VITAMIN B-6) 100 MG tablet Take 100 mg by mouth daily.  . QUEtiapine (SEROQUEL) 25 MG tablet Take 12.5 mg by mouth at bedtime as needed  (sleep).  . Saw Palmetto, Serenoa repens, (SAW PALMETTO EXTRACT) 160 MG CAPS Take 160 mg by mouth daily.  . sildenafil (VIAGRA) 100 MG tablet Take 100 mg by mouth daily as needed for erectile dysfunction.  Marland Kitchen Specialty Vitamins Products (COLLAGEN ULTRA) CAPS Take 1 capsule by mouth daily.  . Turmeric 500 MG CAPS Take 500 mg by mouth daily.  . vitamin B-12 (CYANOCOBALAMIN) 1000 MCG tablet Take 1,000 mcg by mouth daily.     Allergies:   Coconut oil and Ibuprofen   Social History   Socioeconomic History  . Marital status: Single    Spouse name: Not on file  . Number of children: 0  . Years of education: Not on file  . Highest education level: Not on file  Occupational History  . 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 Smoking status: Never Smoker  . Smokeless tobacco: Never Used  Substance and Sexual Activity  . Alcohol use: No  . Drug use: No  . Sexual activity: Not on file  Other Topics Concern  . Not on file  Social History Narrative  . Not on file   Social Determinants of Health   Financial Resource Strain: Not on file  Food Insecurity: Not on file  Transportation Needs: Not on file  Physical Activity: Not on file  Stress: Not on file  Social Connections: Not on file     Family History: The patient's family history includes Diabetes in his brother, maternal grandmother, mother, and another family member; Heart disease in his father; High Cholesterol in his maternal grandmother; Hypertension in his father, mother, and another family member; Stroke in his maternal grandfather and maternal grandmother. There is no history of Autoimmune disease.  ROS:   Please see the history of present illness.     All other systems reviewed and are negative.  EKGs/Labs/Other Studies Reviewed:    The following studies were reviewed today:  Echo 10/31/20: 1. Left ventricular ejection fraction, by estimation, is 60 to 65%. The  left ventricle has normal function. The left  ventricle has no regional  wall motion abnormalities. There is moderate left ventricular hypertrophy.  Left ventricular diastolic  parameters are consistent with Grade I diastolic dysfunction (impaired  relaxation).  2. Right ventricular systolic function is normal. The right ventricular  size is normal. There is normal pulmonary artery systolic pressure.  3. Left atrial size was mildly dilated.  4. The mitral valve is normal in structure. Trivial mitral valve  regurgitation. No evidence of mitral stenosis.  5. The aortic valve is normal in structure. Aortic valve regurgitation is  not visualized. No aortic stenosis is present.  6. The inferior vena cava is normal in size with greater than 50%  respiratory variability, suggesting right atrial pressure of 3 mmHg.   EKG:  EKG is ordered today.  The ekg ordered today demonstrates sinus rhythm HR  69, ST elevation in anterior leads improving  Recent Labs: 10/31/2020: ALT 39; B Natriuretic Peptide 549.0; Magnesium 2.5 11/02/2020: BUN 11; Creatinine, Ser 1.17; Hemoglobin 13.1; Platelets 250; Potassium 3.5; Sodium 136  Recent Lipid Panel No results found for: CHOL, TRIG, HDL, CHOLHDL, VLDL, LDLCALC, LDLDIRECT  Physical Exam:    VS:  BP 132/71   Pulse 97   Ht 6\' 3"  (1.905 m)   Wt (!) 306 lb 6.4 oz (139 kg)   SpO2 96%   BMI 38.30 kg/m     Wt Readings from Last 3 Encounters:  11/18/20 (!) 306 lb 6.4 oz (139 kg)  10/31/20 (!) 313 lb 8 oz (142.2 kg)  10/27/20 (!) 307 lb (139.3 kg)     GEN:  Well nourished, well developed in no acute distress HEENT: Normal NECK: No JVD; No carotid bruits LYMPHATICS: No lymphadenopathy CARDIAC: RRR, no murmurs, no rub RESPIRATORY:  Clear to auscultation without rales, wheezing or rhonchi  ABDOMEN: Soft, non-tender, non-distended MUSCULOSKELETAL:  No edema; No deformity  SKIN: Warm and dry NEUROLOGIC:  Alert and oriented x 3 PSYCHIATRIC:  Normal affect   ASSESSMENT:    1. Pericarditis,  unspecified chronicity, unspecified type   2. COVID-19 virus infection   3. Hypokalemia    PLAN:    In order of problems listed above:  Pericarditis  - complete 3 months of colchcine  - suspect viral etiology s/p COVID infection - will check inflammatory markers and BNP to make sure these are improving - he describes chest pain that is classic for pericarditis - if inflammatory markers not improving, may need steroid course for second occurrence of pericarditis - but there is a risk of reoccurrence with prednisone - will message Dr. 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     Medication Adjustments/Labs and Tests Ordered: Current medicines are reviewed at length with the patient today.  Concerns regarding medicines are outlined above.  Orders Placed This Encounter  Procedures  . C-reactive protein  . Basic metabolic panel  . CBC  . Brain natriuretic peptide  . TSH  . Sedimentation rate  . EKG 12-Lead   Meds ordered this encounter  Medications  . pantoprazole (PROTONIX) 40 MG tablet    Sig: Take 1 tablet (40 mg total) by mouth daily.    Dispense:  90 tablet    Refill:  1    Signed, Tresa Endo, Marcelino Duster  11/18/2020 4:17 PM    [facility]

## 2020-11-18 ENCOUNTER — Ambulatory Visit (INDEPENDENT_AMBULATORY_CARE_PROVIDER_SITE_OTHER): Payer: 59 | Admitting: Physician Assistant

## 2020-11-18 ENCOUNTER — Other Ambulatory Visit: Payer: Self-pay

## 2020-11-18 ENCOUNTER — Encounter: Payer: Self-pay | Admitting: Physician Assistant

## 2020-11-18 VITALS — BP 132/71 | HR 97 | Ht 75.0 in | Wt 306.4 lb

## 2020-11-18 DIAGNOSIS — I319 Disease of pericardium, unspecified: Secondary | ICD-10-CM

## 2020-11-18 DIAGNOSIS — E876 Hypokalemia: Secondary | ICD-10-CM | POA: Diagnosis not present

## 2020-11-18 DIAGNOSIS — U071 COVID-19: Secondary | ICD-10-CM | POA: Diagnosis not present

## 2020-11-18 MED ORDER — PANTOPRAZOLE SODIUM 40 MG PO TBEC: 40.0000 mg | DELAYED_RELEASE_TABLET | Freq: Every day | ORAL | 1 refills | Status: DC

## 2020-11-18 NOTE — Procedures (Signed)
EMG & NCV Findings: Evaluation of the left median motor nerve showed prolonged distal onset latency (4.9 ms).  The right median motor nerve showed prolonged distal onset latency (5.7 ms) and decreased conduction velocity (Elbow-Wrist, 46 m/s).  The left ulnar motor nerve showed decreased conduction velocity (A Elbow-B Elbow, 52 m/s).  The left median (across palm) sensory and the right median (across palm) sensory nerves showed no response (Palm) and prolonged distal peak latency (L5.1, R6.2 ms).  All remaining nerves (as indicated in the following tables) were within normal limits.  Left vs. Right side comparison data for the median motor nerve indicates abnormal L-R latency difference (0.8 ms).  The ulnar motor nerve indicates abnormal L-R velocity difference (B Elbow-Wrist, 12 m/s).  All remaining left vs. right side differences were within normal limits.    All examined muscles (as indicated in the following table) showed no evidence of electrical instability.    Impression: The above electrodiagnostic study is ABNORMAL and reveals evidence of a moderate bilateral median nerve entrapment at the wrist (carpal tunnel syndrome) affecting sensory and motor components. There is no significant electrodiagnostic evidence of any other focal nerve entrapment, brachial plexopathy or radiculopathy.   Recommendations: 1.  Follow-up with referring physician. 2.  Continue current management of symptoms. 3.  Continue use of resting splint at night-time and as needed during the day. 4.  Suggest surgical evaluation.  ___________________________ Naaman Plummer FAAPMR Board Certified, American Board of Physical Medicine and Rehabilitation    Nerve Conduction Studies Anti Sensory Summary Table   Stim Site NR Peak (ms) Norm Peak (ms) P-T Amp (V) Norm P-T Amp Site1 Site2 Delta-P (ms) Dist (cm) Vel (m/s) Norm Vel (m/s)  Left Median Acr Palm Anti Sensory (2nd Digit)  32.2C  Wrist    *5.1 <3.6 13.1 >10 Wrist Palm   0.0    Palm *NR  <2.0          Right Median Acr Palm Anti Sensory (2nd Digit)  31.6C  Wrist    *6.2 <3.6 12.8 >10 Wrist Palm  0.0    Palm *NR  <2.0          Left Radial Anti Sensory (Base 1st Digit)  32.2C  Wrist    2.2 <3.1 25.9  Wrist Base 1st Digit 2.2 0.0    Right Radial Anti Sensory (Base 1st Digit)  31.7C  Wrist    2.4 <3.1 12.4  Wrist Base 1st Digit 2.4 0.0    Left Ulnar Anti Sensory (5th Digit)  32.4C  Wrist    3.7 <3.7 18.7 >15.0 Wrist 5th Digit 3.7 14.0 38 >38  Right Ulnar Anti Sensory (5th Digit)  31.8C  Wrist    3.7 <3.7 20.3 >15.0 Wrist 5th Digit 3.7 14.0 38 >38   Motor Summary Table   Stim Site NR Onset (ms) Norm Onset (ms) O-P Amp (mV) Norm O-P Amp Site1 Site2 Delta-0 (ms) Dist (cm) Vel (m/s) Norm Vel (m/s)  Left Median Motor (Abd Poll Brev)  32.1C  Wrist    *4.9 <4.2 6.5 >5 Elbow Wrist 6.0 30.0 50 >50  Elbow    10.9  6.0         Right Median Motor (Abd Poll Brev)  31.7C  Wrist    *5.7 <4.2 5.7 >5 Elbow Wrist 6.5 30.0 *46 >50  Elbow    12.2  3.9         Left Ulnar Motor (Abd Dig Min)  32C  Wrist    3.1 <4.2  10.5 >3 B Elbow Wrist 4.1 27.0 66 >53  B Elbow    7.2  10.2  A Elbow B Elbow 2.1 11.0 *52 >53  A Elbow    9.3  10.6         Right Ulnar Motor (Abd Dig Min)  31.5C  Wrist    3.1 <4.2 10.4 >3 B Elbow Wrist 5.1 27.5 54 >53  B Elbow    8.2  8.5  A Elbow B Elbow 1.7 11.0 65 >53  A Elbow    9.9  10.0          EMG   Side Muscle Nerve Root Ins Act Fibs Psw Amp Dur Poly Recrt Int Dennie Bible Comment  Right Abd Poll Brev Median C8-T1 Nml Nml Nml Nml Nml 0 Nml Nml   Right 1stDorInt Ulnar C8-T1 Nml Nml Nml Nml Nml 0 Nml Nml   Right PronatorTeres Median C6-7 Nml Nml Nml Nml Nml 0 Nml Nml   Right Biceps Musculocut C5-6 Nml Nml Nml Nml Nml 0 Nml Nml   Right Deltoid Axillary C5-6 Nml Nml Nml Nml Nml 0 Nml Nml     Nerve Conduction Studies Anti Sensory Left/Right Comparison   Stim Site L Lat (ms) R Lat (ms) L-R Lat (ms) L Amp (V) R Amp (V) L-R Amp (%) Site1 Site2 L  Vel (m/s) R Vel (m/s) L-R Vel (m/s)  Median Acr Palm Anti Sensory (2nd Digit)  32.2C  Wrist *5.1 *6.2 1.1 13.1 12.8 2.3 Wrist Palm     Palm             Radial Anti Sensory (Base 1st Digit)  32.2C  Wrist 2.2 2.4 0.2 25.9 12.4 52.1 Wrist Base 1st Digit     Ulnar Anti Sensory (5th Digit)  32.4C  Wrist 3.7 3.7 0.0 18.7 20.3 7.9 Wrist 5th Digit 38 38 0   Motor Left/Right Comparison   Stim Site L Lat (ms) R Lat (ms) L-R Lat (ms) L Amp (mV) R Amp (mV) L-R Amp (%) Site1 Site2 L Vel (m/s) R Vel (m/s) L-R Vel (m/s)  Median Motor (Abd Poll Brev)  32.1C  Wrist *4.9 *5.7 *0.8 6.5 5.7 12.3 Elbow Wrist 50 *46 4  Elbow 10.9 12.2 1.3 6.0 3.9 35.0       Ulnar Motor (Abd Dig Min)  32C  Wrist 3.1 3.1 0.0 10.5 10.4 1.0 B Elbow Wrist 66 54 *12  B Elbow 7.2 8.2 1.0 10.2 8.5 16.7 A Elbow B Elbow *52 65 13  A Elbow 9.3 9.9 0.6 10.6 10.0 5.7          Waveforms:

## 2020-11-18 NOTE — Patient Instructions (Signed)
Medication Instructions:  Start Protonix 40 mg (1 Tablet Daily) *If you need a refill on your cardiac medications before your next appointment, please call your pharmacy*   Lab Work: [facility] If you have labs (blood work) drawn today and your tests are completely normal, you will receive your results only by: Marland Kitchen MyChart Message (if you have MyChart) OR . A paper copy in the mail If you have any lab test that is abnormal or we need to change your treatment, we will call you to review the results.   Testing/Procedures: No Testing   Follow-Up: At [facility], you and your health needs are our priority.  As part of our continuing mission to provide you with exceptional heart care, we have created designated Provider Care Teams.  These Care Teams include your primary Cardiologist (physician) and Advanced Practice Providers (APPs -  Physician Assistants and Nurse Practitioners) who all work together to provide you with the care you need, when you need it.  Your next appointment:   3 week(s)  The format for your next appointment:   In Person  Provider:   You may see Shelva Majestic, MD or one of the following Advanced Practice Providers on your designated Care Team:    Almyra Deforest, PA-C  [name], PA-C or   Roby Lofts, [place]

## 2020-11-19 LAB — CBC
Hematocrit: 39.5 % (ref 37.5–51.0)
Hemoglobin: 13.2 g/dL (ref 13.0–17.7)
MCH: 29.2 pg (ref 26.6–33.0)
MCHC: 33.4 g/dL (ref 31.5–35.7)
MCV: 87 fL (ref 79–97)
Platelets: 263 10*3/uL (ref 150–450)
RBC: 4.52 x10E6/uL (ref 4.14–5.80)
RDW: 13.5 % (ref 11.6–15.4)
WBC: 6.4 10*3/uL (ref 3.4–10.8)

## 2020-11-19 LAB — BASIC METABOLIC PANEL
BUN/Creatinine Ratio: 11 (ref 9–20)
BUN: 12 mg/dL (ref 6–24)
CO2: 24 mmol/L (ref 20–29)
Calcium: 9.4 mg/dL (ref 8.7–10.2)
Chloride: 101 mmol/L (ref 96–106)
Creatinine, Ser: 1.14 mg/dL (ref 0.76–1.27)
GFR calc Af Amer: 92 mL/min/{1.73_m2} (ref >59)
GFR calc non Af Amer: 80 mL/min/{1.73_m2} (ref >59)
Glucose: 84 mg/dL (ref 65–99)
Potassium: 4.4 mmol/L (ref 3.5–5.2)
Sodium: 140 mmol/L (ref 134–144)

## 2020-11-19 LAB — TSH: TSH: 0.603 u[IU]/mL (ref 0.450–4.500)

## 2020-11-19 LAB — SEDIMENTATION RATE: Sed Rate: 40 mm/hr — ABNORMAL HIGH (ref 0–15)

## 2020-11-19 LAB — C-REACTIVE PROTEIN: CRP: 3 mg/L (ref 0–10)

## 2020-11-19 LAB — BRAIN NATRIURETIC PEPTIDE: BNP: 5.9 pg/mL (ref 0.0–100.0)

## 2020-11-20 ENCOUNTER — Telehealth: Payer: Self-pay | Admitting: Cardiovascular Disease

## 2020-11-20 NOTE — Telephone Encounter (Signed)
Pt called in returning Alishas call for results   Best number [phone]

## 2020-11-20 NOTE — Telephone Encounter (Signed)
Spoke with patient regarding his recent lab results from 2/16. Advised patient of the following   Marcelino Duster, [place]  11/19/2020 4:32 PM EST      Your labs overall are improving. One inflammatory marker, CRP, has completely normalized. Your sed rate, another inflammatory marker, is improving. Your electrolytes and kidneys are normal. BNP is normal - your heart is not under strain from excess fluid. Your thyroid is on the low end of normal - we should have your recheck this in 6 months with your PCP. It looks like things are improving and I am hopeful symptoms will start resolving over the next several weeks. Please let us know if your symptoms suddenly worsen. We will see you at follow up. Please call if you have any questions or concerns   Patient verbalized understanding.

## 2020-11-20 NOTE — Telephone Encounter (Signed)
[facility] received paperwork from patient for [employer]. Paperwork was put in providers box on 2/18.

## 2020-11-23 ENCOUNTER — Telehealth: Payer: Self-pay | Admitting: Physician Assistant

## 2020-11-23 NOTE — Telephone Encounter (Signed)
Returned call to patient who states that he forgot to ask Bettina Gavia PA-C at his last appointment on 2/16 if and when he can return to work. 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 Patient states the pain is the same pain he was having, it has not worsened or changed in description. Patient states if he needs to be out of work longer he will need a note for work. Advised patient I would forward message to Bettina Gavia- PA for advice. Patient verbalized understanding.

## 2020-11-23 NOTE — Telephone Encounter (Signed)
Spoke to patient-aware of recommendations.  He does not need letter just  forms completed that were dropped off on 2/18.   Advised if anything additional needed to let us know.

## 2020-11-23 NOTE — Telephone Encounter (Signed)
    Pt said he forgot to ask Nathaniel Jones when he can go back to work, he said his chest still hurting especially when he lays downp

## 2020-11-23 NOTE — Telephone Encounter (Signed)
He should stay out of work until he is evaluated again at his next appt in March.   Can you please provide him a letter for work?  Thank you Angie

## 2020-11-24 NOTE — Telephone Encounter (Signed)
Forms located in Dr. [name] box to complete.

## 2020-11-24 NOTE — Telephone Encounter (Signed)
Forms given to [place], [place] for Bettina Gavia, PA to complete.

## 2020-11-24 NOTE — Telephone Encounter (Signed)
I didn't see any forms. Judeth Cornfield, are there forms in my mailbox at [facility]?

## 2020-11-26 NOTE — Telephone Encounter (Signed)
Paperwork was received back and faxed to [employer] [place] on 2/23.

## 2020-11-27 ENCOUNTER — Telehealth: Payer: Self-pay | Admitting: Physician Assistant

## 2020-11-27 NOTE — Telephone Encounter (Signed)
Received medical records release form, disability paperwork and $25.00 cash from patient     Forwarding to [facility] today

## 2020-11-30 ENCOUNTER — Other Ambulatory Visit: Payer: Self-pay

## 2020-11-30 ENCOUNTER — Telehealth: Payer: Self-pay | Admitting: Cardiovascular Disease

## 2020-11-30 MED ORDER — COLCHICINE 0.6 MG PO CAPS: ORAL_CAPSULE | ORAL | 0 refills | Status: DC

## 2020-11-30 MED ORDER — COLCHICINE 0.6 MG PO TABS: 0.6000 mg | ORAL_TABLET | Freq: Every day | ORAL | 0 refills | Status: DC | PRN

## 2020-11-30 NOTE — Telephone Encounter (Signed)
*  STAT* If patient is at the pharmacy, call can be transferred to refill team.   1. Which medications need to be refilled? (please list name of each medication and dose if known) Colchicine 0.6 MG CAPS  2. Which pharmacy/location (including street and city if local pharmacy) is medication to be sent to? [id number] - [place], [place] - [address]  3. Do they need a 30 day or 90 day supply? 90 day  Patient is out of medication

## 2020-11-30 NOTE — Telephone Encounter (Signed)
Okay to send in a 3 month supply, no refills. Thanks!

## 2020-12-02 ENCOUNTER — Telehealth: Payer: Self-pay | Admitting: Cardiovascular Disease

## 2020-12-02 NOTE — Telephone Encounter (Signed)
Pt c/o of Chest Pain: STAT if CP now or developed within 24 hours  1. Are you having CP right now? yes  2. Are you experiencing any other symptoms (ex. SOB, nausea, vomiting, sweating)? SOB but does not sound SOB on the phone, light sweating  3. How long have you been experiencing CP? Since last appointment February 16th, but it is getting worse  4. Is your CP continuous or coming and going? comes and goes  5. Have you taken Nitroglycerin? no   Patient states his chest pain has been getting worse. He states he also has been having more labored breathing like he cannot take a deep breath  ?

## 2020-12-02 NOTE — Telephone Encounter (Signed)
Spoke with pt who report he is currently experiencing chest pain and labored breathing. It report no precipitating or alleviating factors.  Nurse recommended pt report to ER based on current symptoms.

## 2020-12-03 ENCOUNTER — Telehealth: Payer: Self-pay | Admitting: Physician Assistant

## 2020-12-03 NOTE — Telephone Encounter (Signed)
New Message:     Pt said the last time he saw Micah Flesher, she said if he continue to have Chest Pains she was going to schedule a Cardiac MRI. He says he is still having pains.   Pt c/o of Chest Pain: STAT if CP now or developed within 24 hours  1. Are you having CP right now? Yes  2. Are you experiencing any other symptoms (ex. SOB, nausea, vomiting, sweating)?  no  3. How long have you been experiencing CP?  Been having it every day since in February  4. Is your CP continuous or coming and going? Comes and goes  5. Have you taken Nitroglycerin? no ?

## 2020-12-03 NOTE — Telephone Encounter (Signed)
Spoke with pt on the phone, pt states that he is still having the same pain that he saw Micah Flesher, [place] with on 11/18/20. Pt states that the pain comes and goes and goes away with ibuprofen. Pt also states that pain get worst when he lays down. Pt states that he was told to give [place] a call if pain does not subside within a few weeks of his appointment. Pt says that Marylene Land mentioned a cardiac MRI to further assess.

## 2020-12-03 NOTE — Telephone Encounter (Signed)
Called Nathaniel Jones back per Marylene Land Duke's recommendations. Pt scheduled with Dr. Tresa Endo for tomorrow (12/04/20) at 1:20pm.

## 2020-12-04 ENCOUNTER — Other Ambulatory Visit: Payer: Self-pay

## 2020-12-04 ENCOUNTER — Ambulatory Visit: Payer: 59 | Admitting: Cardiovascular Disease

## 2020-12-04 ENCOUNTER — Encounter: Payer: Self-pay | Admitting: Cardiovascular Disease

## 2020-12-04 DIAGNOSIS — U071 COVID-19: Secondary | ICD-10-CM

## 2020-12-04 DIAGNOSIS — R0609 Other forms of dyspnea: Secondary | ICD-10-CM

## 2020-12-04 DIAGNOSIS — G4733 Obstructive sleep apnea (adult) (pediatric): Secondary | ICD-10-CM

## 2020-12-04 DIAGNOSIS — I10 Essential (primary) hypertension: Secondary | ICD-10-CM

## 2020-12-04 DIAGNOSIS — R079 Chest pain, unspecified: Secondary | ICD-10-CM | POA: Diagnosis not present

## 2020-12-04 DIAGNOSIS — Z6839 Body mass index (BMI) 39.0-39.9, adult: Secondary | ICD-10-CM

## 2020-12-04 DIAGNOSIS — B3323 Viral pericarditis: Secondary | ICD-10-CM | POA: Diagnosis not present

## 2020-12-04 DIAGNOSIS — R0600 Dyspnea, unspecified: Secondary | ICD-10-CM | POA: Diagnosis not present

## 2020-12-04 DIAGNOSIS — E6609 Other obesity due to excess calories: Secondary | ICD-10-CM

## 2020-12-04 NOTE — Patient Instructions (Addendum)
Medication Instructions:  Continue Ibuprofen 400-600 mg three times daily   *If you need a refill on your cardiac medications before your next appointment, please call your pharmacy*   Lab Work: CMET, CBC with diff, Sed rate, CRP today  If you have labs (blood work) drawn today and your tests are completely normal, you will receive your results only by: Marland Kitchen MyChart Message (if you have MyChart) OR . A paper copy in the mail If you have any lab test that is abnormal or we need to change your treatment, we will call you to review the results.   Testing/Procedures: Your physician has requested that you have a cardiac MRI. Cardiac MRI uses a computer to create images of your heart as its beating, producing both still and moving pictures of your heart and major blood vessels. For further information please visit [email or web address]. Please follow the instruction sheet given to you today for more information.  Chest xray - Your physician has requested that you have a chest xray, is a fast and painless imaging test that uses certain electromagnetic waves to create pictures of the structures in and around your chest. This test can help diagnose and monitor conditions such as pneumonia and other lung issues his will be done at [facility] [address], [place]. If you should need to call them their phone number is [phone].   Follow-Up: At [facility], you and your health needs are our priority.  As part of our continuing mission to provide you with exceptional heart care, we have created designated Provider Care Teams.  These Care Teams include your primary Cardiologist (physician) and Advanced Practice Providers (APPs -  Physician Assistants and Nurse Practitioners) who all work together to provide you with the care you need, when you need it.  We recommend signing up for the patient portal called "MyChart".  Sign up information is provided on this After Visit Summary.  MyChart is  used to connect with patients for Virtual Visits (Telemedicine).  Patients are able to view lab/test results, encounter notes, upcoming appointments, etc.  Non-urgent messages can be sent to your provider as well.   To learn more about what you can do with MyChart, go to [email or web address].    Your next appointment:   2 months  The format for your next appointment:   In Person  Provider:   Nicki Guadalajara, MD

## 2020-12-04 NOTE — Progress Notes (Signed)
Cardiology Office Note    Date:  12/06/2020   ID:  Nathaniel Jones, DOB [birth year], MRN [id number]  PCP:  Aurea Graff.Marlou Sa, MD  Cardiologist:  Shelva Majestic, MD    History of Present Illness:  Nathaniel Jones is a 40 y.o. male who I had seen in the past for evaluation of chest pain with atypical features as well as sleepiness.  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  Since his playing days when his weight was 255 pounds he has gained weight and has been around 315.  He has a history of hypertension.  In 2018 he had a normal nuclear stress test and an echo Doppler study showed an EF of 55% with moderate LVH with mild left atrial enlargement and mild TR.  He has obstructive sleep apnea and has had issues with significant excessive daytime sleepiness.  Remotely had been evaluated by Dr. Rexene Alberts and was reevaluated by me on a home study in October 2021 which showed mild overall sleep apnea with an AHI of 10 but in the past he had been noted to have severe OSA during REM sleep which was unable to be assessed on the home study.  He never had a CPAP titration or AutoPap initiation.  The patient developed COVID-19 infection of October 02, 2020.  He apparently presented to the emergency room on October 27, 2020 with headaches, chills myalgias and fever.  He was sent home.  He returned on January 27 with neck pain and significant headache and apparently had a spinal tap which was negative.  A chest x-ray was suspicious for possible pneumonia and he was discharged on Augmentin.  He represented the next day with left precordial chest pain and low-grade fever as well as mild cough.  He was admitted to the hospital on October 31, 2020.  CT of the chest was negative for PE and was felt he had pericarditis and possible consolidation of his right middle upper lobe.  He continued to have low fever.  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   Blood cultures were negative.  Procalcitonin was greater than 4, CRP was 40.  Repeat Covid 19 PCR test was negative.  An echo Doppler study showed an EF of 60 to 65% with grade 1 diastolic dysfunction there was no mention of any pericardial effusion.  He was treated with Rocephin and azithromycin and was recommended to take colchicine and continue allopurinol with his history of gout.  The patient was to have a repeat chest x-ray after completion of antibiotic therapy which she had not yet done.  He was evaluated by [name] on November 18, 2020 and admitted to feeling terrible.  He had finished his antibiotic therapy for pneumonia and sinusitis and admitted to being breathless after 1 flight of steps and even taking a shower left knee is significantly fatigued.  He has not yet returned to work.  Due to diagnosis of pericarditis it was recommended he complete 3 months of colchicine therapy she started him on Relafen instead of ibuprofen but he did not notice any significant change and she had a Protonix 40 mg daily.    Presently, he notes increasing shortness of breath with activity.  He admits to being very weak.  He experiences sharp stabbing pain when he is lying down on the left chest region.  His fever has resolved.  He has not had a follow-up chest x-ray.  He is not sleeping well.  He presents for evaluation.   Past Medical History:  Diagnosis Date  . Hypertension   . Low back pain    herniated disc  . Obesity   . OSA (obstructive sleep apnea)    has not yet received CPAP  . Pericarditis    in his 55s    Past Surgical History:  Procedure Laterality Date  . KNEE SURGERY Right     Current Medications: Outpatient Medications Prior to Visit  Medication Sig Dispense Refill  . acetaminophen (TYLENOL) 500 MG tablet Take 1,000 mg by mouth 3 (three) times daily as needed for fever or headache (pain).    Marland Kitchen allopurinol (ZYLOPRIM) 100 MG tablet Take 100 mg by mouth daily as needed (gout  attacks).    . colchicine 0.6 MG tablet Take 1 tablet (0.6 mg total) by mouth daily as needed (gout attacks). 90 tablet 0  . cyclobenzaprine (FLEXERIL) 5 MG tablet Take 1 tablet (5 mg total) by mouth 3 (three) times daily as needed. 40 tablet 1  . ibuprofen (ADVIL) 200 MG tablet Take 400 mg by mouth every 6 (six) hours as needed for fever or headache (pain).    . nabumetone (RELAFEN) 750 MG tablet Take 1 tablet (750 mg total) by mouth 2 (two) times daily as needed. 60 tablet 6  . pantoprazole (PROTONIX) 40 MG tablet Take 1 tablet (40 mg total) by mouth daily. 90 tablet 1  . QUEtiapine (SEROQUEL) 25 MG tablet Take 12.5 mg by mouth at bedtime as needed (sleep).    . sildenafil (VIAGRA) 100 MG tablet Take 100 mg by mouth daily as needed for erectile dysfunction.    . Colchicine 0.6 MG CAPS TAKE ONE CAPSULE EVERY HOUR FOR THREE DOSES THE FIRST DAY THEN ONCE DAILY THEREAFTER 33 capsule 0  . Biotin 1000 MCG tablet Take 1,000 mcg by mouth daily. (Patient not taking: Reported on 12/04/2020)    . Cranberry 300 MG tablet Take 300 mg by mouth daily. (Patient not taking: Reported on 12/04/2020)    . Ferrous Sulfate (IRON) 325 (65 Fe) MG TABS Take 325 mg by mouth daily. (Patient not taking: Reported on 12/04/2020)    . Glucosamine-Chondroitin 500-400 MG CAPS Take 1 capsule by mouth daily. (Patient not taking: Reported on 12/04/2020)    . Lysine 500 MG CAPS Take 500 mg by mouth daily. (Patient not taking: Reported on 12/04/2020)    . Misc Natural Products (TART CHERRY ADVANCED) CAPS Take 1 capsule by mouth daily. (Patient not taking: Reported on 12/04/2020)    . Multiple Vitamin (MULTIVITAMIN WITH MINERALS) TABS tablet Take 1 tablet by mouth daily. (Patient not taking: Reported on 12/04/2020)    . Omega-3 Fatty Acids (FISH OIL) 1000 MG CAPS Take 1,000 mg by mouth daily. (Patient not taking: Reported on 12/04/2020)    . pyridOXINE (VITAMIN B-6) 100 MG tablet Take 100 mg by mouth daily. (Patient not taking: Reported on 12/04/2020)     . Saw Palmetto, Serenoa repens, (SAW PALMETTO EXTRACT) 160 MG CAPS Take 160 mg by mouth daily. (Patient not taking: Reported on 12/04/2020)    . Specialty Vitamins Products (COLLAGEN ULTRA) CAPS Take 1 capsule by mouth daily. (Patient not taking: Reported on 12/04/2020)    . Turmeric 500 MG CAPS Take 500 mg by mouth daily. (Patient not taking: Reported on 12/04/2020)    . vitamin B-12 (CYANOCOBALAMIN) 1000 MCG tablet Take 1,000 mcg by mouth daily. (Patient not taking: Reported on 12/04/2020)  No facility-administered medications prior to visit.     Allergies:   Coconut oil and Ibuprofen   Social History   Socioeconomic History  . Marital status: Single    Spouse name: Not on file  . Number of children: 0  . Years of education: Not on file  . Highest education level: Not on file  Occupational History  . 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 Smoking status: Never Smoker  . Smokeless tobacco: Never Used  Substance and Sexual Activity  . Alcohol use: No  . Drug use: No  . Sexual activity: Not on file  Other Topics Concern  . Not on file  Social History Narrative  . Not on file   Social Determinants of Health   Financial Resource Strain: Not on file  Food Insecurity: Not on file  Transportation Needs: Not on file  Physical Activity: Not on file  Stress: Not on file  Social Connections: Not on file    Socially he is single.  He was born in [place] with early high school.  He has a BS degree.  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  He has a child.  Family History:  The patient's family history includes Diabetes in his brother, maternal grandmother, mother, and another family member; Heart disease in his father; High Cholesterol in his maternal grandmother; Hypertension in his father, mother, and another family member; Stroke in his maternal  grandfather and maternal grandmother.     ROS General: Negative; No fevers, chills, or night sweats;  HEENT: Negative; No changes in vision or hearing, sinus congestion, difficulty swallowing Pulmonary: Negative; No cough, wheezing, shortness of breath, hemoptysis Cardiovascular: See HPI GI: Negative; No nausea, vomiting, diarrhea, or abdominal pain GU: Negative; No dysuria, hematuria, or difficulty voiding Musculoskeletal: Negative; no myalgias, joint pain, or weakness Hematologic/Oncology: Negative; no easy bruising, bleeding Endocrine: Negative; no heat/cold intolerance; no diabetes Neuro: Negative; no changes in balance, headaches Skin: Negative; No rashes or skin lesions Psychiatric: Negative; No behavioral problems, depression Sleep: OSA, not started on therapy yet; history of snoring, daytime sleepiness, hypersomnolence Other comprehensive 14 point system review is negative.   PHYSICAL EXAM:   VS:  BP 135/81   Pulse 71   Ht 6' 4" (1.93 m)   Wt (!) 315 lb 6.4 oz (143.1 kg)   SpO2 100%   BMI 38.39 kg/m     Repeat blood pressure by me 154/88  Wt Readings from Last 3 Encounters:  12/04/20 (!) 315 lb 6.4 oz (143.1 kg)  11/18/20 (!) 306 lb 6.4 oz (139 kg)  10/31/20 (!) 313 lb 8 oz (142.2 kg)    General: Alert, oriented, no distress.  Skin: normal turgor, no rashes, warm and dry HEENT: Normocephalic, atraumatic. Pupils equal round and reactive to light; sclera anicteric; extraocular muscles intact;  Nose without nasal septal hypertrophy Mouth/Parynx benign; Mallinpatti scale 3 Neck: Thick neck; no JVD, no carotid bruits; normal carotid upstroke Lungs: clear to ausculatation and percussion; no wheezing or rales Chest wall: without tenderness to palpitation Heart: PMI not displaced, RRR, s1 s2 normal, 1/6 systolic murmur, no diastolic murmur, no definitive pericardial friction rub, no gallops, thrills, or heaves Abdomen: soft, nontender; no hepatosplenomehaly, BS+;  abdominal aorta nontender and not dilated by palpation. Back: no CVA tenderness Pulses 2+ Musculoskeletal: full range of motion, normal strength, no joint  deformities Extremities: no clubbing cyanosis or edema, Homan's sign negative  Neurologic: grossly nonfocal; Cranial nerves grossly wnl Psychologic: Normal mood and affect   Studies/Labs Reviewed:   EKG:  EKG is ordered today. ECG (independently read by me): NSR at 71; mild early repolarization changes; QTc 430  I reviewed his recent ECGs from his emergency room and hospitalizations.  Early repolarization changes had evolved since his October 28, 2020 ECG.  Recent Labs: BMP Latest Ref Rng & Units 12/04/2020 11/18/2020 11/02/2020  Glucose 65 - 99 mg/dL 84 84 118(H)  BUN 6 - 24 mg/dL _0 Creatinine 0.76 - 1.27 mg/dL 1.09 1.14 1.17  BUN/Creat Ratio 9 - _1 -  Sodium 134 - 144 mmol/L 143 140 136  Potassium 3.5 - 5.2 mmol/L 3.9 4.4 3.5  Chloride 96 - 106 mmol/L 105 101 103  CO2 20 - 29 mmol/L 21 24 21(L)  Calcium 8.7 - 10.2 mg/dL 8.8 9.4 8.3(L)     Hepatic Function Latest Ref Rng & Units 12/04/2020 10/31/2020 10/29/2020  Total Protein 6.0 - 8.5 g/dL 7.1 6.9 7.0  Albumin 4.0 - 5.0 g/dL 4.1 2.6(L) 3.1(L)  AST 0 - 40 IU/L 34 34 48(H)  ALT 0 - 44 IU/L 47(H) 39 46(H)  Alk Phosphatase 44 - 121 IU/L 81 75 63  Total Bilirubin 0.0 - 1.2 mg/dL 0.6 1.1 2.2(H)  Bilirubin, Direct 0.0 - 0.2 mg/dL - 0.4(H) 0.6(H)    CBC Latest Ref Rng & Units 12/04/2020 11/18/2020 11/02/2020  WBC 3.4 - 10.8 x10E3/uL 5.8 6.4 15.9(H)  Hemoglobin 13.0 - 17.7 g/dL 12.9(L) 13.2 13.1  Hematocrit 37.5 - 51.0 % 39.5 39.5 38.9(L)  Platelets 150 - 450 x10E3/uL - 263 250   Lab Results  Component Value Date   MCV 88 12/04/2020   MCV 87 11/18/2020   MCV 86.6 11/02/2020   Lab Results  Component Value Date   TSH 0.603 11/18/2020   No results found for: HGBA1C   BNP    Component Value Date/Time   BNP 5.9 11/18/2020 1619   BNP 549.0 (H) 10/31/2020 1006     ProBNP No results found for: PROBNP   Lipid Panel  No results found for: CHOL, TRIG, HDL, CHOLHDL, VLDL, LDLCALC, LDLDIRECT, LABVLDL   RADIOLOGY: No results found.   Additional studies/ records that were reviewed today include:  I reviewed his prior office notes, January 2022 hospital records, ECGs, and subsequent evaluation with Angie Duke, PA   ASSESSMENT:    1. Viral pericarditis, unspecified chronicity   2. Exertional dyspnea   3. Chest pain, unspecified type   4. Essential hypertension   5. OSA (obstructive sleep apnea)   6. Class 2 obesity due to excess calories without serious comorbidity with body mass index (BMI) of 38.0 to 38.9 in adult   7. COVID-19 virus infection     PLAN:  Nathaniel Jones is a 40 year old gentleman who has a history of hypertension, obstructive sleep apnea, Covid 19 infection on October 02, 2020 with subsequent sequelae of fever, chills, headache, myalgias, and pneumonia.  Spinal tap was negative.  He was felt to have possible pericarditis and is now on colchicine and he has been recently taking Relafen without much benefit compared to previous ibuprofen.  I have recommended he resume taking ibuprofen and consider taking 400 to 600 mg 3 times a day and continue with pantoprazole.  I am recommending he undergo a PA and lateral chest x-ray for follow-up of his suspected pneumonia and  infiltrate.  I am also scheduling him for cardiac MRI for further assessment of pericarditis and/or myocarditis.  He continues to experience sharp stabbing left-sided chest pain which is nonexertional.  I am rechecking laboratory today consisting of a comprehensive metabolic panel, CBC with differential, erythrocyte sedimentation rate as well as C-reactive protein.  He is very fatigued and has had issues of significant daytime sleepiness.  I reviewed his prior sleep evaluations from Dr. Rexene Alberts and more recently his home study from July 14, 2020.  I will initiate AutoPap  therapy soon as possible since he is still within the 78-monthwindow.  At present, I have recommended he stay out of work until the results of his cardiac MRI are known.  I will see him in 6 to 8 weeks for follow-up evaluation or sooner if needed.   Medication Adjustments/Labs and Tests Ordered: Current medicines are reviewed at length with the patient today.  Concerns regarding medicines are outlined above.  Medication changes, Labs and Tests ordered today are listed in the Patient Instructions below. Patient Instructions  Medication Instructions:  Continue Ibuprofen 400-600 mg three times daily   *If you need a refill on your cardiac medications before your next appointment, please call your pharmacy*   Lab Work: CMET, CBC with diff, Sed rate, CRP today  If you have labs (blood work) drawn today and your tests are completely normal, you will receive your results only by: .Marland KitchenMyChart Message (if you have MyChart) OR . A paper copy in the mail If you have any lab test that is abnormal or we need to change your treatment, we will call you to review the results.   Testing/Procedures: Your physician has requested that you have a cardiac MRI. Cardiac MRI uses a computer to create images of your heart as its beating, producing both still and moving pictures of your heart and major blood vessels. For further information please visit [email or web address] Please follow the instruction sheet given to you today for more information.  Chest xray - Your physician has requested that you have a chest xray, is a fast and painless imaging test that uses certain electromagnetic waves to create pictures of the structures in and around your chest. This test can help diagnose and monitor conditions such as pneumonia and other lung issues [place] [facility] [place] If you should need to call them their phone number is [phone].   Follow-Up: [facility] and  your health needs are our priority.  As part of our continuing mission to provide you with exceptional heart care, we have created designated Provider Care Teams.  These Care Teams include your primary Cardiologist (physician) and Advanced Practice Providers (APPs -  Physician Assistants and Nurse Practitioners) who all work together to provide you with the care you need, when you need it.  We recommend signing up for the patient portal called "MyChart".  Sign up information is provided on this After Visit Summary.  MyChart is used to connect with patients for Virtual Visits (Telemedicine).  Patients are able to view lab/test results, encounter notes, upcoming appointments, etc.  Non-urgent messages can be sent to your provider as well.   To learn more about what you can do with MyChart, go to [email or web address]    Your next appointment:   2 months  The format for your next appointment:   In Person  Provider:   TShelva Majestic MD      Signed, TMarcello Moores  Claiborne Billings, MD  12/06/2020 1:21 PM    [facility] [address], [address], [place], [place]  [address] Phone: [phone]

## 2020-12-05 LAB — COMPREHENSIVE METABOLIC PANEL
ALT: 47 IU/L — ABNORMAL HIGH (ref 0–44)
AST: 34 IU/L (ref 0–40)
Albumin/Globulin Ratio: 1.4 (ref 1.2–2.2)
Albumin: 4.1 g/dL (ref 4.0–5.0)
Alkaline Phosphatase: 81 IU/L (ref 44–121)
BUN/Creatinine Ratio: 12 (ref 9–20)
BUN: 13 mg/dL (ref 6–24)
Bilirubin Total: 0.6 mg/dL (ref 0.0–1.2)
CO2: 21 mmol/L (ref 20–29)
Calcium: 8.8 mg/dL (ref 8.7–10.2)
Chloride: 105 mmol/L (ref 96–106)
Creatinine, Ser: 1.09 mg/dL (ref 0.76–1.27)
Globulin, Total: 3 g/dL (ref 1.5–4.5)
Glucose: 84 mg/dL (ref 65–99)
Potassium: 3.9 mmol/L (ref 3.5–5.2)
Sodium: 143 mmol/L (ref 134–144)
Total Protein: 7.1 g/dL (ref 6.0–8.5)
eGFR: 88 mL/min/{1.73_m2} (ref >59)

## 2020-12-05 LAB — CBC WITH DIFFERENTIAL
Basophils Absolute: 0 10*3/uL (ref 0.0–0.2)
Basos: 1 %
EOS (ABSOLUTE): 0.2 10*3/uL (ref 0.0–0.4)
Eos: 3 %
Hematocrit: 39.5 % (ref 37.5–51.0)
Hemoglobin: 12.9 g/dL — ABNORMAL LOW (ref 13.0–17.7)
Immature Grans (Abs): 0 10*3/uL (ref 0.0–0.1)
Immature Granulocytes: 0 %
Lymphocytes Absolute: 2.3 10*3/uL (ref 0.7–3.1)
Lymphs: 41 %
MCH: 28.6 pg (ref 26.6–33.0)
MCHC: 32.7 g/dL (ref 31.5–35.7)
MCV: 88 fL (ref 79–97)
Monocytes Absolute: 0.6 10*3/uL (ref 0.1–0.9)
Monocytes: 10 %
Neutrophils Absolute: 2.7 10*3/uL (ref 1.4–7.0)
Neutrophils: 45 %
RBC: 4.51 x10E6/uL (ref 4.14–5.80)
RDW: 14.1 % (ref 11.6–15.4)
WBC: 5.8 10*3/uL (ref 3.4–10.8)

## 2020-12-05 LAB — SEDIMENTATION RATE: Sed Rate: 18 mm/hr — ABNORMAL HIGH (ref 0–15)

## 2020-12-05 LAB — C-REACTIVE PROTEIN: CRP: 1 mg/L (ref 0–10)

## 2020-12-06 ENCOUNTER — Encounter: Payer: Self-pay | Admitting: Cardiovascular Disease

## 2020-12-08 ENCOUNTER — Telehealth: Payer: Self-pay | Admitting: Cardiovascular Disease

## 2020-12-08 ENCOUNTER — Encounter: Payer: Self-pay | Admitting: Cardiovascular Disease

## 2020-12-08 NOTE — Telephone Encounter (Signed)
Spoke with patient regarding Friday 01/15/21 9:00 am Cardiac MRI appointment at [facility]---arrival time is 8:30 am 1st floor admissions office for check in.  Will mail information to patient and it is available in My Chart.  Patient voiced his understanding.

## 2020-12-09 ENCOUNTER — Ambulatory Visit: Payer: 59 | Admitting: Medical

## 2021-01-01 ENCOUNTER — Telehealth: Payer: Self-pay | Admitting: Cardiovascular Disease

## 2021-01-01 NOTE — Telephone Encounter (Signed)
Called patient, he states that for the past week or so he has noticed his heart will race- he states he has no other symptoms, does mention slight chest ache but it goes away, no shortness of breath, no swelling, no light headedness, no syncope episodes, patient mentions he notices this more at night when he is laying down. He has no record of what his HR's when he feels it. I did give him instruction on how to manually take a pulse at home, and also wear to get a pulse ox or bp machine to track both. Advised patient this was important to start monitoring and to keep a log of BP/HR. Patient also mentions he has stress and anxiety going on in his life right now due to his FMLA issues, patient advised to cut back caffeine intake and hydrate with water. Patient verbalized understanding, advised when he should seek immediate attention if HR is extremely elevated. Will route to MD to advise of any other suggestions.  Upcoming appointment 02/15/2021.

## 2021-01-01 NOTE — Telephone Encounter (Signed)
    STAT if HR is under 50 or over 120 (normal HR is 60-100 beats per minute)  1) What is your heart rate? Not able to check it  2) Do you have a log of your heart rate readings (document readings)?   3) Do you have any other symptoms? Pt said he's been experience fast heart beat, he doesn't know how to check his HR. He said his HR is fast especially when he lays down, he also get sporadic chest pain but in a very short period of time. He wanted to get Dr. [name] recommendation

## 2021-01-05 MED ORDER — METOPROLOL SUCCINATE ER 25 MG PO TB24: 25.0000 mg | ORAL_TABLET | Freq: Every day | ORAL | 3 refills | Status: DC

## 2021-01-05 NOTE — Telephone Encounter (Signed)
Try adding metoprolol succinate 25 mg daily.  Arrange for follow-up office visit for further evaluation

## 2021-01-05 NOTE — Telephone Encounter (Signed)
Pt updated with MD's recommendations and verbalized understanding. New order placed and appointment scheduled for 4/18 for follow up. Nurse also encouraged pt purchase pulse ox monitor HR.

## 2021-01-11 ENCOUNTER — Telehealth: Payer: Self-pay | Admitting: Cardiovascular Disease

## 2021-01-11 NOTE — Telephone Encounter (Signed)
Spoke to patient he stated he needed a letter for work updating his condition.Advised Dr.Kelly and his RN out of office today.I will send message to Dr.Kelly's RN.

## 2021-01-11 NOTE — Telephone Encounter (Signed)
Patient states he is still on FMLA.  He is requesting an updated letter for his employer discussing Dr. [name] plan for him, including upcoming appointments.

## 2021-01-12 NOTE — Telephone Encounter (Signed)
Ok to extend [employer] until OV on 01/18/21

## 2021-01-12 NOTE — Telephone Encounter (Signed)
Spoke with pt. He report his FMLA ran out on 4/11 and state his job is requesting a letter for extension or a plan. Pt state he has a cardiac MRI scheduled for 4/15 and follow up appointment with Dr. Tresa Endo on 4/18.  Will forward to MD

## 2021-01-13 ENCOUNTER — Telehealth: Payer: Self-pay | Admitting: Cardiovascular Disease

## 2021-01-13 NOTE — Telephone Encounter (Signed)
Patient called and stated that he came to pick up some papers from the office but they were the same ones as before. Dr. Tresa Endo needs to sign FMLA forms

## 2021-01-13 NOTE — Telephone Encounter (Signed)
Pt updated and letter printed for pt to pick up.

## 2021-01-14 ENCOUNTER — Telehealth (HOSPITAL_COMMUNITY): Payer: Self-pay | Admitting: Emergency Medicine

## 2021-01-14 ENCOUNTER — Ambulatory Visit (HOSPITAL_COMMUNITY): Payer: 59

## 2021-01-14 NOTE — Telephone Encounter (Signed)
Reaching out to patient to offer assistance regarding upcoming cardiac imaging study; pt verbalizes understanding of appt date/time, parking situation and where to check in, and verified current allergies; name and call back number provided for further questions should they arise Nathaniel Alexandria RN Navigator Cardiac Imaging [facility] [phone] office [phone] cell  Denies implants, denies claustro  To have a 2V CXR also

## 2021-01-15 ENCOUNTER — Ambulatory Visit (HOSPITAL_COMMUNITY)
Admission: RE | Admit: 2021-01-15 | Discharge: 2021-01-15 | Disposition: A | Discharge status: Home / Self Care | Payer: 59 | Source: Ambulatory Visit | Attending: Cardiovascular Disease | Admitting: Cardiovascular Disease

## 2021-01-15 ENCOUNTER — Other Ambulatory Visit: Payer: Self-pay

## 2021-01-15 DIAGNOSIS — B3323 Viral pericarditis: Secondary | ICD-10-CM | POA: Diagnosis not present

## 2021-01-15 DIAGNOSIS — R0600 Dyspnea, unspecified: Secondary | ICD-10-CM | POA: Insufficient documentation

## 2021-01-15 DIAGNOSIS — R079 Chest pain, unspecified: Secondary | ICD-10-CM | POA: Insufficient documentation

## 2021-01-15 DIAGNOSIS — R0609 Other forms of dyspnea: Secondary | ICD-10-CM

## 2021-01-15 MED ORDER — GADOBUTROL 1 MMOL/ML IV SOLN
10.0000 mL | Freq: Once | INTRAVENOUS | Status: AC | PRN
  Administered 2021-01-15: 10 mL via INTRAVENOUS

## 2021-01-18 ENCOUNTER — Telehealth: Payer: Self-pay | Admitting: Cardiovascular Disease

## 2021-01-18 ENCOUNTER — Ambulatory Visit (INDEPENDENT_AMBULATORY_CARE_PROVIDER_SITE_OTHER): Payer: 59 | Admitting: Cardiovascular Disease

## 2021-01-18 ENCOUNTER — Encounter: Payer: Self-pay | Admitting: Cardiovascular Disease

## 2021-01-18 ENCOUNTER — Other Ambulatory Visit: Payer: Self-pay

## 2021-01-18 VITALS — BP 144/90 | HR 63 | Ht 76.0 in | Wt 325.0 lb

## 2021-01-18 DIAGNOSIS — R0609 Other forms of dyspnea: Secondary | ICD-10-CM

## 2021-01-18 DIAGNOSIS — U071 COVID-19: Secondary | ICD-10-CM | POA: Diagnosis not present

## 2021-01-18 DIAGNOSIS — B3323 Viral pericarditis: Secondary | ICD-10-CM

## 2021-01-18 DIAGNOSIS — G4733 Obstructive sleep apnea (adult) (pediatric): Secondary | ICD-10-CM

## 2021-01-18 DIAGNOSIS — R0600 Dyspnea, unspecified: Secondary | ICD-10-CM | POA: Diagnosis not present

## 2021-01-18 NOTE — Telephone Encounter (Signed)
Patient has an appointment today at 1:40- not sure if you know anything about this.   I will route to covering RN, and primary RN for results if she is aware of this paperwork.   Thank you!

## 2021-01-18 NOTE — Telephone Encounter (Signed)
With his recent Covid at end of December and pericarditis; would hold off booster for several additional months. Can return to work April 25

## 2021-01-18 NOTE — Telephone Encounter (Signed)
Concerns addressed at appointment today 4/18.

## 2021-01-18 NOTE — Patient Instructions (Signed)
Medication Instructions:  Your physician recommends that you continue on your current medications as directed. Please refer to the Current Medication list given to you today.  *If you need a refill on your cardiac medications before your next appointment, please call your pharmacy*  Follow-Up: At [facility], you and your health needs are our priority.  As part of our continuing mission to provide you with exceptional heart care, we have created designated Provider Care Teams.  These Care Teams include your primary Cardiologist (physician) and Advanced Practice Providers (APPs -  Physician Assistants and Nurse Practitioners) who all work together to provide you with the care you need, when you need it.  We recommend signing up for the patient portal called "MyChart".  Sign up information is provided on this After Visit Summary.  MyChart is used to connect with patients for Virtual Visits (Telemedicine).  Patients are able to view lab/test results, encounter notes, upcoming appointments, etc.  Non-urgent messages can be sent to your provider as well.   To learn more about what you can do with MyChart, go to [email or web address].    Your next appointment:   6 month(s)  The format for your next appointment:   In Person  Provider:   Nicki Guadalajara, MD   Other Instructions If you do not hear from our sleep coordinator in the next week, please call our office [phone].

## 2021-01-18 NOTE — Progress Notes (Signed)
Cardiology Office Note    Date:  01/20/2021   ID:  Nathaniel Jones, DOB May 01, 1981, MRN [id number]  PCP:  Aurea Graff.Marlou Sa, MD  Cardiologist:  Shelva Majestic, MD   1 month follow-up evaluation  History of Present Illness:  Nathaniel Jones is a 40 y.o. male who I had seen in the past for evaluation of chest pain with atypical features as well as sleepiness.  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  Since his playing days when his weight was 255 pounds he has gained weight and has been around 315.  He has a history of hypertension.  In 2018 he had a normal nuclear stress test and an echo Doppler study showed an EF of 55% with moderate LVH with mild left atrial enlargement and mild TR.  He has obstructive sleep apnea and has had issues with significant excessive daytime sleepiness.  Remotely had been evaluated by Dr. Rexene Alberts and was reevaluated by me on a home study in October 2021 which showed mild overall sleep apnea with an AHI of 10 but in the past he had been noted to have severe OSA during REM sleep which was unable to be assessed on the home study.  He never had a CPAP titration or AutoPap initiation.  He developed COVID-19 infection on October 02, 2020.  He presented to the emergency room on October 27, 2020 with headaches, chills myalgias and fever.  He was sent home.  He returned on January 27 with neck pain and significant headache and apparently had a spinal tap which was negative.  A chest x-ray was suspicious for possible pneumonia and he was discharged on Augmentin.  He represented the next day with left precordial chest pain and low-grade fever as well as mild cough.  He was admitted to the hospital on October 31, 2020.  CT of the chest was negative for PE and was felt he had pericarditis and possible consolidation of his right middle upper lobe.  He continued to have low fever.  He was felt to have community-acquired pneumonia/post Covid inflammatory  syndrome.  Blood cultures were negative.  Procalcitonin was greater than 4, CRP was 40.  Repeat Covid 19 PCR test was negative.  An echo Doppler study showed an EF of 60 to 65% with grade 1 diastolic dysfunction there was no mention of any pericardial effusion.  He was treated with Rocephin and azithromycin and was recommended to take colchicine and continue allopurinol with his history of gout.  The patient was to have a repeat chest x-ray after completion of antibiotic therapy which she had not yet done.  He was evaluated by [name] on November 18, 2020 and admitted to feeling terrible.  He had finished his antibiotic therapy for pneumonia and sinusitis and admitted to being breathless after 1 flight of steps and even taking a shower left knee is significantly fatigued.  He has not yet returned to work.  Due to diagnosis of pericarditis it was recommended he complete 3 months of colchicine therapy she started him on Relafen instead of ibuprofen but he did not notice any significant change and she had a Protonix 40 mg daily.    I saw him on December 04, 2020 at which time he continued to experience increasing shortness of breath with activity and admitted to being very weak.  He was experiencing episodes of sharp stabbing pain when he was lying down on the left chest region.  He no longer had a fever.  He had not had a follow-up chest x-ray.  He was not sleeping well.  I recommended he undergo a PA and lateral chest x-ray as well as laboratory.  CRP was 1.  Hemoglobin and hematocrit were 12.9 and 39.5.  Creatinine was 1.09.  Erythrocyte sedimentation rate had improved and was now 18 compared to 98 in January and 40 on November 18, 2020.  A chest x-ray January 15, 2021 was negative for acute cardiopulmonary disease.  I scheduled him for cardiac MRI which was done on January 18, 2021 was essentially normal and showed normal LV chamber size with mild increased wall thickness of 12 mm.  LVEF was 60% without regional  wall motion abnormalities.  There was no evidence for myocardial edema.  There was no postcontrast delayed myocardial enhancement.  RV function was normal with an EF of 53%.  Valves were normal.  The pericardium was normal.  His aorta was mildly dilated at the level of the sinus Valsalva measuring 41 mm.  He presents for follow-up office visit today.  He is feeling better.  He has not yet returned to work.  However he still experiences some pain particularly when he is on his back and for this reason is having to sleep on his left side.  He describes the pain as a knifelike sensation at times he also notes a residual dull ache.  He presents for follow-up evaluation.   Past Medical History:  Diagnosis Date  . Hypertension   . Low back pain    herniated disc  . Obesity   . OSA (obstructive sleep apnea)    has not yet received CPAP  . Pericarditis    in his 10s    Past Surgical History:  Procedure Laterality Date  . KNEE SURGERY Right     Current Medications: Outpatient Medications Prior to Visit  Medication Sig Dispense Refill  . acetaminophen (TYLENOL) 500 MG tablet Take 1,000 mg by mouth 3 (three) times daily as needed for fever or headache (pain).    Marland Kitchen allopurinol (ZYLOPRIM) 100 MG tablet Take 100 mg by mouth daily as needed (gout attacks).    . Biotin 1000 MCG tablet Take 1,000 mcg by mouth daily.    . colchicine 0.6 MG tablet Take 1 tablet (0.6 mg total) by mouth daily as needed (gout attacks). 90 tablet 0  . Cranberry 300 MG tablet Take 300 mg by mouth daily.    . cyclobenzaprine (FLEXERIL) 5 MG tablet Take 1 tablet (5 mg total) by mouth 3 (three) times daily as needed. 40 tablet 1  . Ferrous Sulfate (IRON) 325 (65 Fe) MG TABS Take 325 mg by mouth daily.    . Glucosamine-Chondroitin 500-400 MG CAPS Take 1 capsule by mouth daily.    Marland Kitchen ibuprofen (ADVIL) 200 MG tablet Take 400 mg by mouth every 6 (six) hours as needed for fever or headache (pain).    . Lysine 500 MG CAPS Take 500  mg by mouth daily.    . metoprolol succinate (TOPROL-XL) 25 MG 24 hr tablet Take 1 tablet (25 mg total) by mouth daily. Take with or immediately following a meal. 90 tablet 3  . Misc Natural Products (TART CHERRY ADVANCED) CAPS Take 1 capsule by mouth daily.    . Multiple Vitamin (MULTIVITAMIN WITH MINERALS) TABS tablet Take 1 tablet by mouth daily.    . nabumetone (RELAFEN) 750 MG tablet Take 1 tablet (750 mg total) by mouth 2 (two) times  daily as needed. 60 tablet 6  . Omega-3 Fatty Acids (FISH OIL) 1000 MG CAPS Take 1,000 mg by mouth daily.    . pantoprazole (PROTONIX) 40 MG tablet Take 1 tablet (40 mg total) by mouth daily. 90 tablet 1  . pyridOXINE (VITAMIN B-6) 100 MG tablet Take 100 mg by mouth daily.    . QUEtiapine (SEROQUEL) 25 MG tablet Take 12.5 mg by mouth at bedtime as needed (sleep).    . Saw Palmetto, Serenoa repens, (SAW PALMETTO EXTRACT) 160 MG CAPS Take 160 mg by mouth daily.    . sildenafil (VIAGRA) 100 MG tablet Take 100 mg by mouth daily as needed for erectile dysfunction.    Marland Kitchen Specialty Vitamins Products (COLLAGEN ULTRA) CAPS Take 1 capsule by mouth daily.    . Turmeric 500 MG CAPS Take 500 mg by mouth daily.    . vitamin B-12 (CYANOCOBALAMIN) 1000 MCG tablet Take 1,000 mcg by mouth daily.     No facility-administered medications prior to visit.     Allergies:   Coconut oil and Ibuprofen   Social History   Socioeconomic History  . Marital status: Single    Spouse name: Not on file  . Number of children: 0  . Years of education: Not on file  . Highest education level: Not on file  Occupational History  . 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 Smoking status: Never Smoker  . Smokeless tobacco: Never Used  Substance and Sexual Activity  . Alcohol use: No  . Drug use: No  . Sexual activity: Not on file  Other Topics Concern  . Not on file  Social History Narrative  . Not on file   Social Determinants of Health   Financial Resource Strain: Not on  file  Food Insecurity: Not on file  Transportation Needs: Not on file  Physical Activity: Not on file  Stress: Not on file  Social Connections: Not on file    Socially he is single.  He was born in [place] with early high school.  He has a BS degree.  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  He has a child.  Family History:  The patient's family history includes Diabetes in his brother, maternal grandmother, mother, and another family member; Heart disease in his father; High Cholesterol in his maternal grandmother; Hypertension in his father, mother, and another family member; Stroke in his maternal grandfather and maternal grandmother.     ROS General: Negative; No fevers, chills, or night sweats;  HEENT: Negative; No changes in vision or hearing, sinus congestion, difficulty swallowing Pulmonary: Negative; No cough, wheezing, shortness of breath, hemoptysis Cardiovascular: See HPI GI: Negative; No nausea, vomiting, diarrhea, or abdominal pain GU: Negative; No dysuria, hematuria, or difficulty voiding Musculoskeletal: Negative; no myalgias, joint pain, or weakness Hematologic/Oncology: Negative; no easy bruising, bleeding Endocrine: Negative; no heat/cold intolerance; no diabetes Neuro: Negative; no changes in balance, headaches Skin: Negative; No rashes or skin lesions Psychiatric: Negative; No behavioral problems, depression Sleep: OSA, not started on therapy yet; history of snoring, daytime sleepiness, hypersomnolence Other comprehensive 14 point system review is negative.   PHYSICAL EXAM:   VS:  BP (!) 144/90 (BP Location: Left Arm, Patient Position: Sitting, Cuff Size: Large)   Pulse 63   Ht '6\' 4"'  (1.93 m)   Wt (!) 325 lb (147.4 kg)  SpO2 97%   BMI 39.56 kg/m     Repeat blood pressure by me 126/80  Wt Readings from Last 3 Encounters:   01/18/21 (!) 325 lb (147.4 kg)  12/04/20 (!) 315 lb 6.4 oz (143.1 kg)  11/18/20 (!) 306 lb 6.4 oz (139 kg)     General: Alert, oriented, no distress.  Skin: normal turgor, no rashes, warm and dry HEENT: Normocephalic, atraumatic. Pupils equal round and reactive to light; sclera anicteric; extraocular muscles intact;  Nose without nasal septal hypertrophy Mouth/Parynx benign; Mallinpatti scale 3 Neck: No JVD, no carotid bruits; normal carotid upstroke Lungs: clear to ausculatation and percussion; no wheezing or rales Chest wall: without tenderness to palpitation Heart: PMI not displaced, RRR, s1 s2 normal, 1/6 systolic murmur, no diastolic murmur, no rubs, gallops, thrills, or heaves; no pericardial friction rub Abdomen: soft, nontender; no hepatosplenomehaly, BS+; abdominal aorta nontender and not dilated by palpation. Back: no CVA tenderness Pulses 2+ Musculoskeletal: full range of motion, normal strength, no joint deformities Extremities: no clubbing cyanosis or edema, Homan's sign negative  Neurologic: grossly nonfocal; Cranial nerves grossly wnl Psychologic: Normal mood and affect   Studies/Labs Reviewed:   EKG:  EKG is ordered today. ECG (independently read by me): NSR at 63; LVH Q III T wave abnormality III, aVF  December 04, 2020 ECG (independently read by me): NSR at 71; mild early repolarization changes; QTc 430  I reviewed his recent ECGs from his emergency room and hospitalizations.  Early repolarization changes had evolved since his October 28, 2020 ECG.  Recent Labs: BMP Latest Ref Rng & Units 12/04/2020 11/18/2020 11/02/2020  Glucose 65 - 99 mg/dL 84 84 118(H)  BUN 6 - 24 mg/dL '13 12 11  ' Creatinine 0.76 - 1.27 mg/dL 1.09 1.14 1.17  BUN/Creat Ratio 9 - '20 12 11 ' -  Sodium 134 - 144 mmol/L 143 140 136  Potassium 3.5 - 5.2 mmol/L 3.9 4.4 3.5  Chloride 96 - 106 mmol/L 105 101 103  CO2 20 - 29 mmol/L 21 24 21(L)  Calcium 8.7 - 10.2 mg/dL 8.8 9.4 8.3(L)     Hepatic  Function Latest Ref Rng & Units 12/04/2020 10/31/2020 10/29/2020  Total Protein 6.0 - 8.5 g/dL 7.1 6.9 7.0  Albumin 4.0 - 5.0 g/dL 4.1 2.6(L) 3.1(L)  AST 0 - 40 IU/L 34 34 48(H)  ALT 0 - 44 IU/L 47(H) 39 46(H)  Alk Phosphatase 44 - 121 IU/L 81 75 63  Total Bilirubin 0.0 - 1.2 mg/dL 0.6 1.1 2.2(H)  Bilirubin, Direct 0.0 - 0.2 mg/dL - 0.4(H) 0.6(H)    CBC Latest Ref Rng & Units 12/04/2020 11/18/2020 11/02/2020  WBC 3.4 - 10.8 x10E3/uL 5.8 6.4 15.9(H)  Hemoglobin 13.0 - 17.7 g/dL 12.9(L) 13.2 13.1  Hematocrit 37.5 - 51.0 % 39.5 39.5 38.9(L)  Platelets 150 - 450 x10E3/uL - 263 250   Lab Results  Component Value Date   MCV 88 12/04/2020   MCV 87 11/18/2020   MCV 86.6 11/02/2020   Lab Results  Component Value Date   TSH 0.603 11/18/2020   No results found for: HGBA1C   BNP    Component Value Date/Time   BNP 5.9 11/18/2020 1619   BNP 549.0 (H) 10/31/2020 1006    ProBNP No results found for: PROBNP   Lipid Panel  No results found for: CHOL, TRIG, HDL, CHOLHDL, VLDL, LDLCALC, LDLDIRECT, LABVLDL   RADIOLOGY: DG Chest 2 View  Result Date: 01/15/2021 CLINICAL DATA:  40 year old male with a history of chest  pain EXAM: CHEST - 2 VIEW COMPARISON:  10/31/2020 FINDINGS: Cardiomediastinal silhouette unchanged in size and contour. No interlobular septal thickening. No pneumothorax or pleural effusion. No confluent airspace disease. No displaced fracture IMPRESSION: Negative for acute cardiopulmonary disease Electronically Signed   By: Corrie Mckusick D.O.   On: 01/15/2021 10:05   MR CARDIAC MORPHOLOGY W WO CONTRAST  Result Date: 01/18/2021 CLINICAL DATA:  Chest pain, nonspecific chest pain EXAM: CARDIAC MRI TECHNIQUE: The patient was scanned on a 1.5 Tesla GE magnet. A dedicated cardiac coil was used. Functional imaging was done using Fiesta sequences. 2,3, and 4 chamber views were done to assess for RWMA's. Modified Simpson's rule using a short axis stack was used to calculate an ejection  fraction on a dedicated work Conservation officer, nature. The patient received 43m GADAVIST GADOBUTROL 1 MMOL/ML IV SOLN. After 10 minutes inversion recovery sequences were used to assess for infiltration and scar tissue. FINDINGS: LEFT VENTRICLE: Normal left ventricular chamber size. Mildly increased left ventricular wall thickness. Maximal wall thickness 12 mm in the basal septal wall. Normal left ventricular systolic function. LVEF = 60% There are no regional wall motion abnormalities. No myocardial edema, T2 time 46 msec. Normal first pass perfusion. There is no post contrast delayed myocardial enhancement. Normal T1 myocardial nulling kinetics suggest against a diagnosis of cardiac amyloidosis. ECV = 23%, within normal range RIGHT VENTRICLE: Normal right ventricular chamber size. Normal right ventricular wall thickness. Normal right ventricular systolic function. RVEF = 53% There are no regional wall motion abnormalities. No post contrast delayed myocardial enhancement. ATRIA: Normal left atrial size. Normal right atrial size. Redundant atrial septum. VALVES: No significant valvular abnormalities. PERICARDIUM: Normal pericardium.  No pericardial effusion. AORTA: Mildly dilated aorta at level of sinus of Valsalva, measuring approximately 41 mm. OTHER: No significant extracardiac findings. MEASUREMENTS: Left ventricle: LV male LV EF: 60% (normal 56-78%) Absolute volumes: LV EDV: 174 mL (Normal 77-195 mL) LV ESV: 70 mL (Normal 19-72 mL) LV SV: 104 mL (Normal 51-133 mL) CO: 8.3 L/min (Normal 2.8-8.8 L/min) Indexed volumes: LV EDV: 63 mL/sq-m (Normal 47-92 mL/sq-m) LV ESV: 25 mL/sq-m (Normal 13-30 mL/sq-m) LV SV: 37 mL/sq-m (Normal 32-62 mL/sq-m) CI: 3.00 L/min/sq-m (Normal 1.7-4.2 L/min/sq-m) Right ventricle: RV male RV EF: 53  % (Normal 47-74%) Absolute volumes: RV EDV: 1818m(Normal 88-227 mL) RV ESV:87 mL (Normal 23-103 mL) RV SV: 9636mNormal 52-138 mL) CO: 7.7L/min (Normal 2.8-8.8 L/min) Indexed volumes: RV  EDV: 25m6m-m (Normal 55-105 mL/sq-m) RV ESV:31 mL/sq-m (Normal 15-43 mL/sq-m) RV SV: 35mL64mm (Normal 32-64 mL/sq-m) CI: 2.79L/min/sq-m (Normal 1.7-4.2 L/min/sq-m) IMPRESSION: 1. Normal left ventricular chamber size and function. Mild LVH. LVEF 60% 2.  Normal right ventricular size and function.  RVEF 53% 3. No post contrast delayed myocardial enhancement. No evidence of scar, fibrosis, inflammation or infarction. No myocardial edema. No findings to suggest myocarditis. 4.  Normal pericardium with no delayed enhancement. 5. Mild ascending aorta dilation, 41 mm at sinus of Valsalva measured in transverse plane (not orthogonal). Electronically Signed   By: GayatCherlynn Kaiser: 01/18/2021 14:03     Additional studies/ records that were reviewed today include:  I reviewed his prior office notes, January 2022 hospital records, ECGs, and subsequent evaluation with Angie Duke, PA   ASSESSMENT:    1. Viral pericarditis, unspecified chronicity   2. OSA (obstructive sleep apnea)   3. Exertional dyspnea   4. COVID-19 virus infection     PLAN:  Mr. Amon Nathaniel Jones 39 ye83 old gentleman  who has a history of hypertension, obstructive sleep apnea, Covid 19 infection on October 02, 2020 with subsequent sequelae of fever, chills, headache, myalgias, and pneumonia.  Spinal tap was negative.  He was felt to have possible pericarditis and is on colchicine and he has been recently taking Relafen without much benefit compared to previous ibuprofen.  When I saw him on December 04, 2020 I recommended he resume ibuprofen this is actually improved some of his symptomatology.  I scheduled him for PA and lateral chest x-ray which was without acute Neri disease.  I also scheduled him for cardiac MRI which essentially is normal and was done yesterday.  At the time of the office visit I only had preliminary data but the final report was completed after his visit which is noted above and is entirely normal.  Recent laboratory is  stable and his C-reactive protein was 1 and his erythrocyte sedimentation rate had significantly improved.  Clinically he is doing well.  I believe he can return to work and have said that he can return as of April 25 and he continues to have nonischemic, chest wall discomfort.  He is still waiting for CPAP machine and plans were to institute AutoPap.  Her sleep coordinator was not in the office today we will have her further investigate.  I discussed with him that there is a back order on equipment.  I will see him in 6 months or sooner within 2 months  after initiation of AutoPap therapy.    Medication Adjustments/Labs and Tests Ordered: Current medicines are reviewed at length with the patient today.  Concerns regarding medicines are outlined above.  Medication changes, Labs and Tests ordered today are listed in the Patient Instructions below. Patient Instructions  Medication Instructions:  Your physician recommends that you continue on your current medications as directed. Please refer to the Current Medication list given to you today.  *If you need a refill on your cardiac medications before your next appointment, please call your pharmacy*  Follow-Up: At [facility], you and your health needs are our priority.  As part of our continuing mission to provide you with exceptional heart care, we have created designated Provider Care Teams.  These Care Teams include your primary Cardiologist (physician) and Advanced Practice Providers (APPs -  Physician Assistants and Nurse Practitioners) who all work together to provide you with the care you need, when you need it.  We recommend signing up for the patient portal called "MyChart".  Sign up information is provided on this After Visit Summary.  MyChart is used to connect with patients for Virtual Visits (Telemedicine).  Patients are able to view lab/test results, encounter notes, upcoming appointments, etc.  Non-urgent messages can be sent to your  provider as well.   To learn more about what you can do with MyChart, go to [email or web address].    Your next appointment:   6 month(s)  The format for your next appointment:   In Person  Provider:   Shelva Majestic, MD   Other Instructions If you do not hear from our sleep coordinator in the next week, please call our office [phone].     Signed, Shelva Majestic, MD  01/20/2021 6:28 PM    [facility] [address], [place], [place], [place]  [address] Phone: [phone]

## 2021-01-18 NOTE — Telephone Encounter (Signed)
PT is calling in he forgot to ask the Dr. Knox Saliva questions before he left. He is going to get the 1st booster shot the pharmacist told him it could cause mor heart problems advised to ask Dr if it is OK to get. He is still having a little pain after procedure wants to know if its ok to go back to work In May.Please advise

## 2021-01-19 NOTE — Telephone Encounter (Signed)
Called patient, he states he understands the MD recommendations regarding the covid vaccine.  He does question about being put out until the end of April, I did advise patient that Dr.Kelly only said until the 25th of April but patient requested it be sent to reevaluate as he is still having pains in his chest.   Either way he would like a letter stating he can be out.   Will route to MD and covering nurse.  Thanks!

## 2021-01-19 NOTE — Telephone Encounter (Signed)
Letter provided for pt. Per MD pt is cleared to return to work on 01/25/21.

## 2021-01-20 ENCOUNTER — Telehealth: Payer: Self-pay | Admitting: *Deleted

## 2021-01-20 ENCOUNTER — Encounter: Payer: Self-pay | Admitting: Cardiovascular Disease

## 2021-01-20 NOTE — Telephone Encounter (Signed)
APAP orders sent to [facility].

## 2021-01-28 ENCOUNTER — Telehealth: Payer: Self-pay | Admitting: Cardiovascular Disease

## 2021-01-28 NOTE — Telephone Encounter (Signed)
Pt c/o of Chest Pain: STAT if CP now or developed within 24 hours  1. Are you having CP right now? Yes, but patient states it is dull   2. Are you experiencing any other symptoms (ex. SOB, nausea, vomiting, sweating)?  No   3. How long have you been experiencing CP?  On and off for the past 3 months  4. Is your CP continuous or coming and going? Coming and going  5. Have you taken Nitroglycerin?  No, patient states he does not have any nitro  Around 12:15 PM today patient states he became upset with his supervisor and he had a severe episode of chest pain. He states it was like a level 10 pain. He states the pain has since eased up, but he still has a dull ache in his chest. ?

## 2021-01-28 NOTE — Telephone Encounter (Signed)
Spoke to pt on the phone regarding chest pain that he experienced earlier today after a heated discussion with his supervisor. Pt experienced a stabbing pain in his chest that he rated 10/10 that resolved to a dull ache. Per chart pt was recently seen by Dr. Tresa Endo on 01/19/21 for follow up on viral pericarditis. Pt states that this is a very similar feeling that he has had associated with his pericarditis. Pt wanted to check with cardiologist before taking any medication to help with discomfort.   Spoke with DOD (Dr. Antoine Poche) to advise. Per DOD pt can take ibuprofen 400-600mg  twice daily PRN to help with chest discomfort.  Relayed to pt Dr. Jenene Slicker advise. Pt verbalizes understanding.

## 2021-02-15 ENCOUNTER — Other Ambulatory Visit: Payer: Self-pay

## 2021-02-15 ENCOUNTER — Encounter: Payer: Self-pay | Admitting: Cardiovascular Disease

## 2021-02-15 ENCOUNTER — Ambulatory Visit (INDEPENDENT_AMBULATORY_CARE_PROVIDER_SITE_OTHER): Payer: 59 | Admitting: Cardiovascular Disease

## 2021-02-15 VITALS — BP 138/84 | HR 72 | Ht 76.0 in | Wt 319.0 lb

## 2021-02-15 DIAGNOSIS — R002 Palpitations: Secondary | ICD-10-CM

## 2021-02-15 DIAGNOSIS — R0789 Other chest pain: Secondary | ICD-10-CM | POA: Diagnosis not present

## 2021-02-15 DIAGNOSIS — G4733 Obstructive sleep apnea (adult) (pediatric): Secondary | ICD-10-CM

## 2021-02-15 DIAGNOSIS — I10 Essential (primary) hypertension: Secondary | ICD-10-CM

## 2021-02-15 DIAGNOSIS — B3323 Viral pericarditis: Secondary | ICD-10-CM

## 2021-02-15 MED ORDER — METOPROLOL SUCCINATE ER 50 MG PO TB24: 50.0000 mg | ORAL_TABLET | Freq: Every day | ORAL | 3 refills | Status: DC

## 2021-02-15 NOTE — Patient Instructions (Signed)
Medication Instructions:  INCREASE metoprolol succinate to 50mg  once daily.  *If you need a refill on your cardiac medications before your next appointment, please call your pharmacy*   Lab Work: None ordered.   Testing/Procedures: None ordered.    Follow-Up: At [facility], you and your health needs are our priority.  As part of our continuing mission to provide you with exceptional heart care, we have created designated Provider Care Teams.  These Care Teams include your primary Cardiologist (physician) and Advanced Practice Providers (APPs -  Physician Assistants and Nurse Practitioners) who all work together to provide you with the care you need, when you need it.  We recommend signing up for the patient portal called "MyChart".  Sign up information is provided on this After Visit Summary.  MyChart is used to connect with patients for Virtual Visits (Telemedicine).  Patients are able to view lab/test results, encounter notes, upcoming appointments, etc.  Non-urgent messages can be sent to your provider as well.   To learn more about what you can do with MyChart, go to [facility].    Your next appointment:   Keep your July 5th appointment.   The format for your next appointment:   In Person  Provider:   1972, MD

## 2021-02-15 NOTE — Progress Notes (Signed)
Cardiology Office Note    Date:  02/15/2021   ID:  Nathaniel Jones, DOB [birth year], MRN [id number]  PCP:  Aurea Graff.Marlou Sa, MD  Cardiologist:  Shelva Majestic, MD   One month evaluation with chief complaint of recent atypical chest pain  History of Present Illness:  Nathaniel Jones is a 40 y.o. male who I had seen in the past for evaluation of chest pain with atypical features as well as sleepiness.  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  Since his playing days when his weight was 255 pounds he has gained weight and has been around 315.  He has a history of hypertension.  In 2018 he had a normal nuclear stress test and an echo Doppler study showed an EF of 55% with moderate LVH with mild left atrial enlargement and mild TR.  He has obstructive sleep apnea and has had issues with significant excessive daytime sleepiness.  Remotely had been evaluated by Dr. Rexene Alberts and was reevaluated by me on a home study in October 2021 which showed mild overall sleep apnea with an AHI of 10 but in the past he had been noted to have severe OSA during REM sleep which was unable to be assessed on the home study.  He never had a CPAP titration or AutoPap initiation.  He developed COVID-19 infection on October 02, 2020.  He presented to the emergency room on October 27, 2020 with headaches, chills myalgias and fever.  He was sent home.  He returned on January 27 with neck pain and significant headache and apparently had a spinal tap which was negative.  A chest x-ray was suspicious for possible pneumonia and he was discharged on Augmentin.  He represented the next day with left precordial chest pain and low-grade fever as well as mild cough.  He was admitted to the hospital on October 31, 2020.  CT of the chest was negative for PE and was felt he had pericarditis and possible consolidation of his right middle upper lobe.  He continued to have low fever.  He was felt to have  community-acquired pneumonia/post Covid inflammatory syndrome.  Blood cultures were negative.  Procalcitonin was greater than 4, CRP was 40.  Repeat Covid 19 PCR test was negative.  An echo Doppler study showed an EF of 60 to 65% with grade 1 diastolic dysfunction there was no mention of any pericardial effusion.  He was treated with Rocephin and azithromycin and was recommended to take colchicine and continue allopurinol with his history of gout.  The patient was to have a repeat chest x-ray after completion of antibiotic therapy which she had not yet done.  He was evaluated by [name] on November 18, 2020 and admitted to feeling terrible.  He had finished his antibiotic therapy for pneumonia and sinusitis and admitted to being breathless after 1 flight of steps and even taking a shower left knee is significantly fatigued.  He has not yet returned to work.  Due to diagnosis of pericarditis it was recommended he complete 3 months of colchicine therapy she started him on Relafen instead of ibuprofen but he did not notice any significant change and she had a Protonix 40 mg daily.    I saw him on December 04, 2020 at which time he continued to experience increasing shortness of breath with activity and admitted to being very weak.  He was experiencing episodes of sharp stabbing pain when he was  lying down on the left chest region.  He no longer had a fever.  He had not had a follow-up chest x-ray.  He was not sleeping well.  I recommended he undergo a PA and lateral chest x-ray as well as laboratory.  CRP was 1.  Hemoglobin and hematocrit were 12.9 and 39.5.  Creatinine was 1.09.  Erythrocyte sedimentation rate had improved and was now 18 compared to 98 in January and 40 on November 18, 2020.  A chest x-ray January 15, 2021 was negative for acute cardiopulmonary disease.  I scheduled him for cardiac MRI which was done on January 18, 2021 was essentially normal and showed normal LV chamber size with mild increased  wall thickness of 12 mm.  LVEF was 60% without regional wall motion abnormalities.  There was no evidence for myocardial edema.  There was no postcontrast delayed myocardial enhancement.  RV function was normal with an EF of 53%.  Valves were normal.  The pericardium was normal.  His aorta was mildly dilated at the level of the sinus Valsalva measuring 41 mm.  I last saw him on January 18, 2021 in follow-up office visit.  At that time he felt better but had not yet returned to work.  However he was still experiencing some pain while at sleeping on his back and reason was sleeping on his left side.  He described the pain as a knifelike sensation is also noted a vague residual dullness.  During that evaluation, I recommended that he can return to work.  I felt his chest pain was nonischemic and chest wall discomfort.  He was still waiting for his machine and I recommended institution of AutoPap therapy.  Since I saw him, he called the office on January 28, 2021 after he experienced recurrent sharp chest pain episodes after he had had a heated discussion with his supervisor at work.  Also admits to his heart rate increasing when he lies on his back even before he falls asleep with heart rates in the upper 90s.  He has tried instituting AutoPap therapy.  However he is only been sleeping several hours with it.  He has been having difficulty with the full facemask that was provided to him which covers the nose and mouth.  He was added onto my schedule for follow-up evaluation.  Past Medical History:  Diagnosis Date  . Hypertension   . Low back pain    herniated disc  . Obesity   . OSA (obstructive sleep apnea)    has not yet received CPAP  . Pericarditis    in his 23s    Past Surgical History:  Procedure Laterality Date  . KNEE SURGERY Right     Current Medications: Outpatient Medications Prior to Visit  Medication Sig Dispense Refill  . acetaminophen (TYLENOL) 500 MG tablet Take 1,000 mg by mouth 3  (three) times daily as needed for fever or headache (pain).    Marland Kitchen allopurinol (ZYLOPRIM) 100 MG tablet Take 100 mg by mouth daily as needed (gout attacks).    . Biotin 1000 MCG tablet Take 1,000 mcg by mouth daily.    . colchicine 0.6 MG tablet Take 1 tablet (0.6 mg total) by mouth daily as needed (gout attacks). 90 tablet 0  . Cranberry 300 MG tablet Take 300 mg by mouth daily.    . cyclobenzaprine (FLEXERIL) 5 MG tablet Take 1 tablet (5 mg total) by mouth 3 (three) times daily as needed. 40 tablet 1  . Ferrous Sulfate (  IRON) 325 (65 Fe) MG TABS Take 325 mg by mouth daily.    . Glucosamine-Chondroitin 500-400 MG CAPS Take 1 capsule by mouth daily.    Marland Kitchen ibuprofen (ADVIL) 200 MG tablet Take 400 mg by mouth every 6 (six) hours as needed for fever or headache (pain).    . Lysine 500 MG CAPS Take 500 mg by mouth daily.    . Misc Natural Products (TART CHERRY ADVANCED) CAPS Take 1 capsule by mouth daily.    . Multiple Vitamin (MULTIVITAMIN WITH MINERALS) TABS tablet Take 1 tablet by mouth daily.    . nabumetone (RELAFEN) 750 MG tablet Take 1 tablet (750 mg total) by mouth 2 (two) times daily as needed. 60 tablet 6  . Omega-3 Fatty Acids (FISH OIL) 1000 MG CAPS Take 1,000 mg by mouth daily.    . pantoprazole (PROTONIX) 40 MG tablet Take 1 tablet (40 mg total) by mouth daily. 90 tablet 1  . pyridOXINE (VITAMIN B-6) 100 MG tablet Take 100 mg by mouth daily.    . QUEtiapine (SEROQUEL) 25 MG tablet Take 12.5 mg by mouth at bedtime as needed (sleep).    . Saw Palmetto, Serenoa repens, (SAW PALMETTO EXTRACT) 160 MG CAPS Take 160 mg by mouth daily.    . sildenafil (VIAGRA) 100 MG tablet Take 100 mg by mouth daily as needed for erectile dysfunction.    Marland Kitchen Specialty Vitamins Products (COLLAGEN ULTRA) CAPS Take 1 capsule by mouth daily.    . Turmeric 500 MG CAPS Take 500 mg by mouth daily.    . vitamin B-12 (CYANOCOBALAMIN) 1000 MCG tablet Take 1,000 mcg by mouth daily.    . metoprolol succinate (TOPROL-XL) 25  MG 24 hr tablet Take 1 tablet (25 mg total) by mouth daily. Take with or immediately following a meal. 90 tablet 3   No facility-administered medications prior to visit.     Allergies:   Coconut oil and Ibuprofen   Social History   Socioeconomic History  . Marital status: Single    Spouse name: Not on file  . Number of children: 0  . Years of education: Not on file  . Highest education level: Not on file  Occupational History  . 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 Smoking status: Never Smoker  . Smokeless tobacco: Never Used  Substance and Sexual Activity  . Alcohol use: No  . Drug use: No  . Sexual activity: Not on file  Other Topics Concern  . Not on file  Social History Narrative  . Not on file   Social Determinants of Health   Financial Resource Strain: Not on file  Food Insecurity: Not on file  Transportation Needs: Not on file  Physical Activity: Not on file  Stress: Not on file  Social Connections: Not on file    Socially he is single.  He was born in [place] with early high school.  He has a BS degree.  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  He has a child.  Family History:  The patient's family history includes Diabetes in his brother, maternal grandmother, mother, and another family member; Heart disease in his father; High Cholesterol in his maternal grandmother; Hypertension in his father, mother, and another family member; Stroke in his maternal grandfather and maternal grandmother.     ROS General: Negative; No fevers, chills, or  night sweats;  HEENT: Negative; No changes in vision or hearing, sinus congestion, difficulty swallowing Pulmonary: Negative; No cough, wheezing, shortness of breath, hemoptysis Cardiovascular: See HPI GI: Negative; No nausea, vomiting, diarrhea, or abdominal pain GU: Negative; No  dysuria, hematuria, or difficulty voiding Musculoskeletal: Negative; no myalgias, joint pain, or weakness Hematologic/Oncology: Negative; no easy bruising, bleeding Endocrine: Negative; no heat/cold intolerance; no diabetes Neuro: Negative; no changes in balance, headaches Skin: Negative; No rashes or skin lesions Psychiatric: Negative; No behavioral problems, depression Sleep: OSA, recently has started a trial of AutoPap; history of snoring, daytime sleepiness, hypersomnolence Other comprehensive 14 point system review is negative.   PHYSICAL EXAM:   VS:  BP 138/84   Pulse 72   Ht '6\' 4"'  (1.93 m)   Wt (!) 319 lb (144.7 kg)   SpO2 97%   BMI 38.83 kg/m     Repeat blood pressure by me was 130/82  Wt Readings from Last 3 Encounters:  02/15/21 (!) 319 lb (144.7 kg)  01/18/21 (!) 325 lb (147.4 kg)  12/04/20 (!) 315 lb 6.4 oz (143.1 kg)    General: Alert, oriented, no distress.  Skin: normal turgor, no rashes, warm and dry HEENT: Normocephalic, atraumatic. Pupils equal round and reactive to light; sclera anicteric; extraocular muscles intact;  Nose without nasal septal hypertrophy Mouth/Parynx benign; Mallinpatti scale 3 Neck: No JVD, no carotid bruits; normal carotid upstroke Lungs: clear to ausculatation and percussion; no wheezing or rales Chest wall: Left costochondral chest wall tenderness to palpation Heart: PMI not displaced, RRR, s1 s2 normal, 1/6 systolic murmur, no diastolic murmur, no rubs, gallops, thrills, or heaves Abdomen: soft, nontender; no hepatosplenomehaly, BS+; abdominal aorta nontender and not dilated by palpation. Back: no CVA tenderness Pulses 2+ Musculoskeletal: full range of motion, normal strength, no joint deformities Extremities: no clubbing cyanosis or edema, Homan's sign negative  Neurologic: grossly nonfocal; Cranial nerves grossly wnl Psychologic: Normal mood and affect   Studies/Labs Reviewed:   EKG:  EKG is ordered today. ECG (independently  read by me): Normal sinus rhythm at 72 bpm.  Previously noted T wave abnormality in lead III and aVF.  January 28, 2021 ECG (independently read by me): NSR at 63; LVH Q III T wave abnormality III, aVF  December 04, 2020 ECG (independently read by me): NSR at 71; mild early repolarization changes; QTc 430  I reviewed his recent ECGs from his emergency room and hospitalizations.  Early repolarization changes had evolved since his October 28, 2020 ECG.  Recent Labs: BMP Latest Ref Rng & Units 12/04/2020 11/18/2020 11/02/2020  Glucose 65 - 99 mg/dL 84 84 118(H)  BUN 6 - 24 mg/dL '13 12 11  ' Creatinine 0.76 - 1.27 mg/dL 1.09 1.14 1.17  BUN/Creat Ratio 9 - '20 12 11 ' -  Sodium 134 - 144 mmol/L 143 140 136  Potassium 3.5 - 5.2 mmol/L 3.9 4.4 3.5  Chloride 96 - 106 mmol/L 105 101 103  CO2 20 - 29 mmol/L 21 24 21(L)  Calcium 8.7 - 10.2 mg/dL 8.8 9.4 8.3(L)     Hepatic Function Latest Ref Rng & Units 12/04/2020 10/31/2020 10/29/2020  Total Protein 6.0 - 8.5 g/dL 7.1 6.9 7.0  Albumin 4.0 - 5.0 g/dL 4.1 2.6(L) 3.1(L)  AST 0 - 40 IU/L 34 34 48(H)  ALT 0 - 44 IU/L 47(H) 39 46(H)  Alk Phosphatase 44 - 121 IU/L 81 75 63  Total Bilirubin 0.0 - 1.2 mg/dL 0.6 1.1 2.2(H)  Bilirubin, Direct 0.0 - 0.2 mg/dL -  0.4(H) 0.6(H)    CBC Latest Ref Rng & Units 12/04/2020 11/18/2020 11/02/2020  WBC 3.4 - 10.8 x10E3/uL 5.8 6.4 15.9(H)  Hemoglobin 13.0 - 17.7 g/dL 12.9(L) 13.2 13.1  Hematocrit 37.5 - 51.0 % 39.5 39.5 38.9(L)  Platelets 150 - 450 x10E3/uL - 263 250   Lab Results  Component Value Date   MCV 88 12/04/2020   MCV 87 11/18/2020   MCV 86.6 11/02/2020   Lab Results  Component Value Date   TSH 0.603 11/18/2020   No results found for: HGBA1C   BNP    Component Value Date/Time   BNP 5.9 11/18/2020 1619   BNP 549.0 (H) 10/31/2020 1006    ProBNP No results found for: PROBNP   Lipid Panel  No results found for: CHOL, TRIG, HDL, CHOLHDL, VLDL, LDLCALC, LDLDIRECT, LABVLDL   RADIOLOGY: No results  found.   Additional studies/ records that were reviewed today include:  I reviewed his prior office notes, January 2022 hospital records, ECGs, and subsequent evaluation with Doreene Adas, PA   ASSESSMENT:    1. Essential hypertension   2. OSA (obstructive sleep apnea)   3. Musculoskeletal chest pain   4. Palpitations   5. Viral pericarditis, unspecified chronicity     PLAN:  Nathaniel Jones is a 40 year old gentleman who has a history of hypertension, obstructive sleep apnea, Covid 19 infection on October 02, 2020 with subsequent sequelae of fever, chills, headache, myalgias, and pneumonia.  Spinal tap was negative.  He was felt to have possible pericarditis and is on colchicine and he has been recently taking Relafen without much benefit compared to previous ibuprofen.  When I saw him on December 04, 2020 I recommended he resume ibuprofen this is actually improved some of his symptomatology.  I scheduled him for PA and lateral chest x-ray which was without acute disease.  I also scheduled him for cardiac MRI which essentially was normal.  Recent laboratory also showed significantly improved erythrocyte sedimentation rate and C-reactive protein was normal.  Since I last saw him, Nathaniel Jones has experienced some sharp knifelike chest pain symptomatology which seem to be more precipitated by stress.  He had called the office on April 28 and was advised to take ibuprofen.  He has been taking 600 mg twice a day with benefit.  Recently he has noticed his heart rate speeding up when he lies flat and oftentimes his pulse may be in the upper 90s.  He has been on metoprolol succinate 25 mg daily and I have suggested he increase this to 50 mg.  He has started AutoPap therapy.  He has had difficulty with the full facemask which it sounds like it is an F 20 fullface mask by style.  I believe he may benefit from a trial of just a nasal mask and I have provided him with a new ResMed AirFit N 30i mask which I believe he  should tolerate well.  If he does require significant elevated pressures, this may need to subsequently be changed to an F 30 little I.  He has a sleep appointment scheduled already to see me in July.  He will contact us sooner if recurrent problems develop.  He had his COVID infection the end of December.  He was asking today about getting a booster vaccination.  Since it is approximately 6 months since his infection I have said it should be fine for him to have his booster shot which is being required by his employment.   Medication  Adjustments/Labs and Tests Ordered: Current medicines are reviewed at length with the patient today.  Concerns regarding medicines are outlined above.  Medication changes, Labs and Tests ordered today are listed in the Patient Instructions below. Patient Instructions  Medication Instructions:  INCREASE metoprolol succinate to 30m once daily.  *If you need a refill on your cardiac medications before your next appointment, please call your pharmacy*   Lab Work: None ordered.   Testing/Procedures: None ordered.    Follow-Up: [facility] you and your health needs are our priority.  As part of our continuing mission to provide you with exceptional heart care, we have created designated Provider Care Teams.  These Care Teams include your primary Cardiologist (physician) and Advanced Practice Providers (APPs -  Physician Assistants and Nurse Practitioners) who all work together to provide you with the care you need, when you need it.  We recommend signing up for the patient portal called "MyChart".  Sign up information is provided on this After Visit Summary.  MyChart is used to connect with patients for Virtual Visits (Telemedicine).  Patients are able to view lab/test results, encounter notes, upcoming appointments, etc.  Non-urgent messages can be sent to your provider as well.   To learn more about what you can do with MyChart, go to [email or web address]     Your next appointment:   Keep your July 5th appointment.   The format for your next appointment:   In Person  Provider:   TShelva Majestic MD        Signed, TShelva Majestic MD  02/15/2021 3:58 PM    [place] [place]  [id number]: [phone]

## 2021-04-06 ENCOUNTER — Ambulatory Visit: Payer: 59 | Admitting: Cardiovascular Disease

## 2021-04-21 ENCOUNTER — Other Ambulatory Visit: Payer: Self-pay

## 2021-04-21 ENCOUNTER — Ambulatory Visit: Payer: 59 | Admitting: Podiatry

## 2021-04-21 DIAGNOSIS — L600 Ingrowing nail: Secondary | ICD-10-CM | POA: Diagnosis not present

## 2021-04-21 NOTE — Progress Notes (Signed)
   Subjective: Patient presents today for evaluation of pain to the lateral border left great toe. Patient is concerned for possible ingrown nail.  It is very sensitive to touch.  Patient presents today for further treatment and evaluation.  Past Medical History:  Diagnosis Date   Hypertension    Low back pain    herniated disc   Obesity    OSA (obstructive sleep apnea)    has not yet received CPAP   Pericarditis    in his 20s    Objective:  General: Well developed, nourished, in no acute distress, alert and oriented x3   Dermatology: Skin is warm, dry and supple bilateral.  Lateral border of the great toe appears to be erythematous with evidence of an ingrowing nail. Pain on palpation noted to the border of the nail fold. The remaining nails appear unremarkable at this time. There are no open sores, lesions.  Vascular: Dorsalis Pedis artery and Posterior Tibial artery pedal pulses palpable. No lower extremity edema noted.   Neruologic: Grossly intact via light touch bilateral.  Musculoskeletal: Muscular strength within normal limits in all groups bilateral. Normal range of motion noted to all pedal and ankle joints.   Assesement: #1 Paronychia with ingrowing nail lateral border left great toe #2 Pain in toe  Plan of Care:  1. Patient evaluated.  2. Discussed treatment alternatives and plan of care. Explained nail avulsion procedure and post procedure course to patient. 3. Patient opted for permanent partial nail avulsion of the ingrown toenail.  4. Prior to procedure, local anesthesia infiltration utilized using 3 ml of a 50:50 mixture of 2% plain lidocaine and 0.5% plain marcaine in a normal hallux block fashion and a betadine prep performed.  5. Partial permanent nail avulsion with chemical matrixectomy performed using 3x30sec applications of phenol followed by alcohol flush.  6. Light dressing applied.  Post care instructions provided 7.  Prescription for gentamicin 2% cream   8.  Return to clinic 2 weeks.  Felecia Shelling, DPM [facility]  Dr. Felecia Shelling, DPM    [address] [place], [place] [address]                Office [phone]  Fax [phone]

## 2021-05-03 ENCOUNTER — Other Ambulatory Visit: Payer: Self-pay

## 2021-05-03 ENCOUNTER — Ambulatory Visit (INDEPENDENT_AMBULATORY_CARE_PROVIDER_SITE_OTHER): Payer: 59 | Admitting: Podiatry

## 2021-05-03 DIAGNOSIS — L600 Ingrowing nail: Secondary | ICD-10-CM

## 2021-05-03 NOTE — Progress Notes (Signed)
   Subjective: 40 y.o. male presents today status post permanent nail avulsion procedure of the lateral border left great toe that was performed on 04/21/2021.  Patient states that he is feeling much better.  Significant improvement.  No new complaints at this time  Past Medical History:  Diagnosis Date   Hypertension    Low back pain    herniated disc   Obesity    OSA (obstructive sleep apnea)    has not yet received CPAP   Pericarditis    in his 20s    Objective: Skin is warm, dry and supple. Nail and respective nail fold appears to be healing appropriately. Open wound to the associated nail fold with a granular wound base and moderate amount of fibrotic tissue. Minimal drainage noted. Mild erythema around the periungual region likely due to phenol chemical matricectomy.  Assessment: #1 s/p partial permanent nail matrixectomy lateral border left great toe   Plan of care: #1 patient was evaluated  #2 light debridement of open wound was performed to the periungual border of the respective toe using a currette. Antibiotic ointment and Band-Aid was applied. #3 patient is to return to clinic on a PRN basis.   Felecia Shelling, DPM [facility]  Dr. Felecia Shelling, DPM    [address] [place], [place] [address]                Office [phone]  Fax [phone]

## 2021-06-10 ENCOUNTER — Ambulatory Visit: Payer: 59 | Admitting: Cardiovascular Disease

## 2021-07-29 ENCOUNTER — Telehealth: Payer: Self-pay | Admitting: Cardiovascular Disease

## 2021-07-29 NOTE — Telephone Encounter (Signed)
Left message to call back  

## 2021-07-29 NOTE — Telephone Encounter (Signed)
Spoke with patient of Dr. Tresa Endo   He reports when he is breathing, he feels like this is similar symptoms to when he had pericarditis - dull chest pain These symptoms have been going on for a few days, worse when lying down   He had URI a few weeks ago - right sided chest discomfort  Scheduled him for acute visit with Dr. Flora Lipps tomorrow 07/30/21 @ 11:30am

## 2021-07-29 NOTE — Telephone Encounter (Signed)
Patient returning call.

## 2021-07-29 NOTE — Telephone Encounter (Signed)
Pt c/o Shortness Of Breath: STAT if SOB developed within the last 24 hours or pt is noticeably SOB on the phone  1. Are you currently SOB (can you hear that pt is SOB on the phone)? no  2. How long have you been experiencing SOB? A few days   3. Are you SOB when sitting or when up moving around? Any time he tries to take a deep breath   4. Are you currently experiencing any other symptoms? No  Patient states he gets like a dull pain that comes whenever he tries to take a deep breath.

## 2021-07-30 ENCOUNTER — Other Ambulatory Visit: Payer: Self-pay

## 2021-07-30 ENCOUNTER — Ambulatory Visit (INDEPENDENT_AMBULATORY_CARE_PROVIDER_SITE_OTHER): Payer: 59 | Admitting: Cardiovascular Disease

## 2021-07-30 ENCOUNTER — Encounter: Payer: Self-pay | Admitting: Cardiovascular Disease

## 2021-07-30 VITALS — BP 152/99 | HR 65 | Ht 76.0 in | Wt 315.0 lb

## 2021-07-30 DIAGNOSIS — R091 Pleurisy: Secondary | ICD-10-CM | POA: Diagnosis not present

## 2021-07-30 DIAGNOSIS — R072 Precordial pain: Secondary | ICD-10-CM | POA: Diagnosis not present

## 2021-07-30 MED ORDER — INDOMETHACIN 25 MG PO CAPS: 25.0000 mg | ORAL_CAPSULE | Freq: Three times a day (TID) | ORAL | 0 refills | Status: DC

## 2021-07-30 NOTE — Patient Instructions (Addendum)
Medication Instructions:  Start Indomethacin 25 mg three times daily for 7 days (drink plenty of water and eat with this medication)  *If you need a refill on your cardiac medications before your next appointment, please call your pharmacy*   Lab Work: ESR, CRP today   If you have labs (blood work) drawn today and your tests are completely normal, you will receive your results only by: [place] (if you have MyChart) OR A paper copy in the mail If you have any lab test that is abnormal or we need to change your treatment, we will call you to review the results.   Follow-Up: At [facility], you and your health needs are our priority.  As part of our continuing mission to provide you with exceptional heart care, we have created designated Provider Care Teams.  These Care Teams include your primary Cardiologist (physician) and Advanced Practice Providers (APPs -  Physician Assistants and Nurse Practitioners) who all work together to provide you with the care you need, when you need it.  We recommend signing up for the patient portal called "MyChart".  Sign up information is provided on this After Visit Summary.  MyChart is used to connect with patients for Virtual Visits (Telemedicine).  Patients are able to view lab/test results, encounter notes, upcoming appointments, etc.  Non-urgent messages can be sent to your provider as well.   To learn more about what you can do with MyChart, go to [email or web address].    Your next appointment:   2 month(s)  The format for your next appointment:   In Person  Provider:   Shelva Majestic, MD

## 2021-07-30 NOTE — Progress Notes (Signed)
Cardiology Office Note:   Date:  07/30/2021  NAME:  Nathaniel Jones    MRN: [id number] DOB:  02-Feb-1981   PCP:  Alroy Dust, L.Marlou Sa, MD  Cardiologist:  Shelva Majestic, MD  Electrophysiologist:  None   Referring MD: Aurea Graff.Marlou Sa, MD   Chief Complaint  Patient presents with   Chest Pain    History of Present Illness:   Nathaniel Jones is a 40 y.o. male with a hx of obesity, HTN, OSA, pericarditis who presents for follow-up.  He reports for the past 2 to 3 weeks has had intermittent tightness in his chest.  He reports he had an upper respiratory infection roughly 2 weeks ago.  He describes some sharp pain in his right chest.  It has moved into his left chest.  He reports it is worse with heavy exertion as well as with laying back.  He also reports deep inspiration can make the pain worse.  Symptoms can occur daily.  Not getting better.  He is concerned he may have pericarditis again.  No exertional chest pressure.  No significant shortness of breath.  He does take colchicine for gout.  He may be suffering from a gout flare as well.  His blood pressure is a bit elevated in office 152/99.  He reports he has an element of whitecoat hypertension.  Overall BP appears to be controlled at home.  He is using his sleep machine.  No significant change in medical history other than recent upper respiratory infection.  I reviewed the CT PE study from earlier this year.  No evidence of coronary calcium.  EKG in office demonstrates normal sinus rhythm.  There were no acute ischemic changes or evidence of infarction.  Symptoms of chest tightness or not improving.  He is very concerned about recurrent pericarditis.  Problem List HTN Obesity  OSA Pericarditis 10/2020 -MRI normal 01/2021  Past Medical History: Past Medical History:  Diagnosis Date   Hypertension    Low back pain    herniated disc   Obesity    OSA (obstructive sleep apnea)    has not yet received CPAP   Pericarditis    in his 82s    Past  Surgical History: Past Surgical History:  Procedure Laterality Date   KNEE SURGERY Right     Current Medications: Current Meds  Medication Sig   acetaminophen (TYLENOL) 500 MG tablet Take 1,000 mg by mouth 3 (three) times daily as needed for fever or headache (pain).   allopurinol (ZYLOPRIM) 100 MG tablet Take 100 mg by mouth daily as needed (gout attacks).   Biotin 1000 MCG tablet Take 1,000 mcg by mouth daily.   colchicine 0.6 MG tablet Take 1 tablet (0.6 mg total) by mouth daily as needed (gout attacks).   Cranberry 300 MG tablet Take 300 mg by mouth daily.   cyclobenzaprine (FLEXERIL) 5 MG tablet Take 1 tablet (5 mg total) by mouth 3 (three) times daily as needed.   Ferrous Sulfate (IRON) 325 (65 Fe) MG TABS Take 325 mg by mouth daily.   Glucosamine-Chondroitin 500-400 MG CAPS Take 1 capsule by mouth daily.   indomethacin (INDOCIN) 25 MG capsule Take 1 capsule (25 mg total) by mouth 3 (three) times daily with meals.   Lysine 500 MG CAPS Take 500 mg by mouth daily.   metoprolol succinate (TOPROL XL) 50 MG 24 hr tablet Take 1 tablet (50 mg total) by mouth daily. Take with or immediately following a meal.   Misc Natural Products (  TART CHERRY ADVANCED) CAPS Take 1 capsule by mouth daily.   Multiple Vitamin (MULTIVITAMIN WITH MINERALS) TABS tablet Take 1 tablet by mouth daily.   Omega-3 Fatty Acids (FISH OIL) 1000 MG CAPS Take 1,000 mg by mouth daily.   pantoprazole (PROTONIX) 40 MG tablet Take 1 tablet (40 mg total) by mouth daily.   pyridOXINE (VITAMIN B-6) 100 MG tablet Take 100 mg by mouth daily.   QUEtiapine (SEROQUEL) 25 MG tablet Take 12.5 mg by mouth at bedtime as needed (sleep).   Saw Palmetto, Serenoa repens, (SAW PALMETTO EXTRACT) 160 MG CAPS Take 160 mg by mouth daily.   sildenafil (VIAGRA) 100 MG tablet Take 100 mg by mouth daily as needed for erectile dysfunction.   Specialty Vitamins Products (COLLAGEN ULTRA) CAPS Take 1 capsule by mouth daily.   Turmeric 500 MG CAPS Take  500 mg by mouth daily.   vitamin B-12 (CYANOCOBALAMIN) 1000 MCG tablet Take 1,000 mcg by mouth daily.   [DISCONTINUED] ibuprofen (ADVIL) 200 MG tablet Take 400 mg by mouth every 6 (six) hours as needed for fever or headache (pain).   [DISCONTINUED] nabumetone (RELAFEN) 750 MG tablet Take 1 tablet (750 mg total) by mouth 2 (two) times daily as needed.     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  Tobacco Use   Smoking status: Never   Smokeless tobacco: Never  Substance and Sexual Activity   Alcohol use: No   Drug use: No   Sexual activity: Not on file  Other Topics Concern   Not on file  Social History Narrative   Not on file   Social Determinants of Health   Financial Resource Strain: Not on file  Food Insecurity: Not on file  Transportation Needs: Not on file  Physical Activity: Not on file  Stress: Not on file  Social Connections: Not on file     Family History: The patient'sfamily history includes Diabetes in his brother, maternal grandmother, mother, and another family member; Heart disease in his father; High Cholesterol in his maternal grandmother; Hypertension in his father, mother, and another family member; Stroke in his maternal grandfather and maternal grandmother. There is no history of Autoimmune disease.  ROS:   All other ROS reviewed and negative. Pertinent positives noted in the HPI.     EKGs/Labs/Other Studies Reviewed:   The following studies were personally reviewed by me today:  EKG:  EKG is ordered today.  The ekg ordered today demonstrates normal sinus rhythm heart rate 65, no acute ischemic changes or evidence of infarction, and was personally reviewed by me.   CMR  01/18/2021 IMPRESSION: 1. Normal left ventricular chamber size and function. Mild LVH. LVEF 60%   2.  Normal right ventricular size and function.  RVEF 53%   3. No post contrast delayed myocardial enhancement. No evidence of scar, fibrosis, inflammation or infarction. No myocardial edema. No findings to suggest myocarditis.   4.  Normal pericardium with no delayed enhancement.   5. Mild ascending aorta dilation, 41 mm at sinus of Valsalva measured in transverse plane (not orthogonal).  Recent Labs: 10/31/2020: Magnesium 2.5 11/18/2020: BNP 5.9; Platelets 263; TSH 0.603 12/04/2020: ALT 47; BUN 13; Creatinine, Ser  1.09; Hemoglobin 12.9; Potassium 3.9; Sodium 143   Recent Lipid Panel No results found for: CHOL, TRIG, HDL, CHOLHDL, VLDL, LDLCALC, LDLDIRECT  Physical Exam:   VS:  BP (!) 152/99   Pulse 65   Ht _0  (1.93 m)   Wt (!) 315 lb (142.9 kg)   SpO2 98%   BMI 38.34 kg/m    Wt Readings from Last 3 Encounters:  07/30/21 (!) 315 lb (142.9 kg)  02/15/21 (!) 319 lb (144.7 kg)  01/18/21 (!) 325 lb (147.4 kg)    General: Well nourished, well developed, in no acute distress Head: Atraumatic, normal size  Eyes: PEERLA, EOMI  Neck: Supple, no JVD Endocrine: No thryomegaly Cardiac: Normal S1, S2; RRR; no murmurs, rubs, or gallops Lungs: Clear to auscultation bilaterally, no wheezing, rhonchi or rales  Abd: Soft, nontender, no hepatomegaly  Ext: No edema, pulses 2+ Musculoskeletal: No deformities, BUE and BLE strength normal and equal Skin: Warm and dry, no rashes   Neuro: Alert and oriented to person, place, time, and situation, CNII-XII grossly intact, no focal deficits  Psych: Normal mood and affect   ASSESSMENT:   Nathaniel Jones is a 40 y.o. male who presents for the following: 1. Precordial pain   2. Pleurisy     PLAN:   1. Precordial pain 2. Pleurisy -Pleuritic chest pain for the last 2 weeks.  He is not hypoxic.  EKG demonstrates sinus rhythm with no acute ischemic  changes.  Symptoms do sound similar to pericarditis but not classic.  His MRI in April showed no evidence of pericarditis and had recurrent symptoms then.  I believe this could just be pleurisy related to a chest cold.  However we will check an ESR and CRP to make sure.  I think it is reasonable to treat him with indomethacin 25 mg 3 times daily for 7 days.  I have instructed him not to take ibuprofen or Relafen while on these medications.  He should continue with a PPI as well.  His cardiovascular examination is very normal.  He is maintained colchicine due to gout.  He should continue this as well.  Overall no strong suspicion for recurrent pericarditis.  Inflammatory markers will give [place] more information.  Nonetheless we will treat him with an anti-inflammatory.  If markers are elevated he will need prolonged treatment.  We will see how he does.  He will follow Dr. Claiborne Billings in 2 months.  Disposition: Return in about 2 months (around 09/29/2021).  Medication Adjustments/Labs and Tests Ordered: Current medicines are reviewed at length with the patient today.  Concerns regarding medicines are outlined above.  Orders Placed This Encounter  Procedures   Sedimentation rate   C-reactive protein   EKG 12-Lead    Meds ordered this encounter  Medications   indomethacin (INDOCIN) 25 MG capsule    Sig: Take 1 capsule (25 mg total) by mouth 3 (three) times daily with meals.    Dispense:  21 capsule    Refill:  0     Patient Instructions  Medication Instructions:  Start Indomethacin 25 mg three times daily for 7 days (drink plenty of water and eat with this medication)  *If you need a refill on your cardiac medications before your next appointment, please call your pharmacy*   Lab Work: ESR, CRP today   If you have labs (blood work) drawn today and your tests are completely normal, you will receive your results only by: [place] (if you have MyChart) OR  A paper copy in the mail If you  have any lab test that is abnormal or we need to change your treatment, we will call you to review the results.   Follow-Up: At [facility], you and your health needs are our priority.  As part of our continuing mission to provide you with exceptional heart care, we have created designated Provider Care Teams.  These Care Teams include your primary Cardiologist (physician) and Advanced Practice Providers (APPs -  Physician Assistants and Nurse Practitioners) who all work together to provide you with the care you need, when you need it.  We recommend signing up for the patient portal called "MyChart".  Sign up information is provided on this After Visit Summary.  MyChart is used to connect with patients for Virtual Visits (Telemedicine).  Patients are able to view lab/test results, encounter notes, upcoming appointments, etc.  Non-urgent messages can be sent to your provider as well.   To learn more about what you can do with MyChart, go to [email or web address].    Your next appointment:   2 month(s)  The format for your next appointment:   In Person  Provider:   Shelva Majestic, MD      Time Spent with Patient: I have spent a total of 35 minutes with patient reviewing hospital notes, telemetry, EKGs, labs and examining the patient as well as establishing an assessment and plan that was discussed with the patient.  > 50% of time was spent in direct patient care.  Signed, Addison Naegeli. Audie Box, MD, [facility]  [address] [place], [place] [address] [phone]  07/30/2021 1:07 PM

## 2021-07-31 LAB — SEDIMENTATION RATE: Sed Rate: 8 mm/hr (ref 0–15)

## 2021-07-31 LAB — C-REACTIVE PROTEIN: CRP: 19 mg/L — ABNORMAL HIGH (ref 0–10)

## 2021-08-02 ENCOUNTER — Telehealth: Payer: Self-pay | Admitting: Cardiovascular Disease

## 2021-08-02 NOTE — Progress Notes (Signed)
He has evidence of recurrent pericarditis.  We need to extend his taper of indomethacin.  I will send him a message.  The plan will be to prescribe 50 mg 3 times daily for 14 days followed by 50 mg twice daily for 14 days followed by 50 mg daily for 14 days followed by 25 mg daily for 14 days.  I will send him a message letting him know that he should continue colchicine.  He should also continue his Protonix and drink plenty of water while on these medications.  Gerri Spore T. Flora Lipps, MD, [facility]  [facility]  [address] [place], [place] [address]   Also file as a note          [phone]  4:45 PM

## 2021-08-02 NOTE — Telephone Encounter (Signed)
Spoke to pt. He was recently seen by Dr. Scharlene Gloss (DOD) on 10/28 for chest pain x 2-3 weeks. At that time, Dr. Scharlene Gloss believed symptoms could be pleurisy related to a chest cold and prescribed Indomethacin 25 mg 3 times daily for 7 days. Pt state since then, symptoms has worsen. He report he is more exhausted and symptoms are similar to when his was hospitalized.      Dr. Jens Som (DOD) made aware and recommend to report to ER for further evaluations. Pt became upset and state he does not want to go to ER to run up a bill. He is requesting medication changes and would like a message sent to Dr. Tresa Endo as he report he is the only one who really know him. Nurse again advise recommendation but pt declined. Will forward to MD.

## 2021-08-02 NOTE — Telephone Encounter (Signed)
Patient wanted to talk to Dr. Tresa Endo or his Nurse today about getting an antibiotic.  When the patient had his first spell of pericarditis he had to go to the ER to have a high dose of antibiotics. He came in to see Dr. Flora Lipps who was DOD because he was having similar symptoms but did not get any antibiotics. He also says the pain medicine is not helping his symptoms.  Please call

## 2021-08-03 ENCOUNTER — Encounter: Payer: Self-pay | Admitting: Physician Assistant

## 2021-08-03 ENCOUNTER — Ambulatory Visit: Payer: 59 | Admitting: Physician Assistant

## 2021-08-03 ENCOUNTER — Other Ambulatory Visit: Payer: Self-pay

## 2021-08-03 VITALS — BP 130/90 | HR 60 | Ht 76.0 in | Wt 315.8 lb

## 2021-08-03 DIAGNOSIS — I309 Acute pericarditis, unspecified: Secondary | ICD-10-CM

## 2021-08-03 DIAGNOSIS — I10 Essential (primary) hypertension: Secondary | ICD-10-CM

## 2021-08-03 DIAGNOSIS — R079 Chest pain, unspecified: Secondary | ICD-10-CM

## 2021-08-03 MED ORDER — COLCHICINE 0.6 MG PO TABS: 0.6000 mg | ORAL_TABLET | Freq: Two times a day (BID) | ORAL | 1 refills | Status: DC

## 2021-08-03 MED ORDER — INDOMETHACIN 25 MG PO CAPS: ORAL_CAPSULE | ORAL | 0 refills | Status: DC

## 2021-08-03 NOTE — Patient Instructions (Addendum)
Medication Instructions:  INCREASE Colchicine to 0.6 mg 2 times a day  Follow the instructions for the Indomethacin (Indocin) 25 mg as follows: Take 2 tablets (50 mg) three times daily for 14 days, then take 2 tablets (50 mg) twice daily for 14 days, then take 2 tablets (50 mg) once daily for 14 days, then take 1 tablet (25 mg) once daily for 14 days. *If you need a refill on your cardiac medications before your next appointment, please call your pharmacy*  Lab Work: NONE ordered at this time of appointment   If you have labs (blood work) drawn today and your tests are completely normal, you will receive your results only by: MyChart Message (if you have MyChart) OR A paper copy in the mail If you have any lab test that is abnormal or we need to change your treatment, we will call you to review the results.  Testing/Procedures: NONE ordered at this time of appointment   Follow-Up: At [facility], you and your health needs are our priority.  As part of our continuing mission to provide you with exceptional heart care, we have created designated Provider Care Teams.  These Care Teams include your primary Cardiologist (physician) and Advanced Practice Providers (APPs -  Physician Assistants and Nurse Practitioners) who all work together to provide you with the care you need, when you need it.  Your next appointment:   As scheduled    The format for your next appointment:   In Person  Provider:   Nicki Guadalajara, MD  Other Instructions

## 2021-08-03 NOTE — Telephone Encounter (Signed)
Spoke to patient. Patient is very concerned  that no one is  hearing him about his condition with his chest discomfort.  Patient was  upset he that he did not hear from the office yesterday. RN informed patient message was sent to  his doctor - doctor was not in the office.   Message was reviewed by  doctor of the day . The patient did not want to follow recommendation Patient keeps referencing that he has had the same type of symptoms twice in his life  once at the age of 75 and the last time was in Jan 2022.    Patient states he does not have the money to keep going to the  emergency room  for evaluation    RN made an appointment for patient today with Lisabeth Devoid PA at 2:45 pm

## 2021-08-03 NOTE — Telephone Encounter (Signed)
Pt c/o of Chest Pain: STAT if CP now or developed within 24 hours  1. Are you having CP right now? Yes   2. Are you experiencing any other symptoms (ex. SOB, nausea, vomiting, sweating)? SOB upon exertion, CP worsens when laying down   3. How long have you been experiencing CP? About two weeks ago   4. Is your CP continuous or coming and going?  Continuous   5. Have you taken Nitroglycerin? No, doesn't have Nitroglycerin  ?

## 2021-08-03 NOTE — Progress Notes (Signed)
Cardiology Office Note:    Date:  08/05/2021   ID:  Nathaniel Jones, DOB 1980/12/05, MRN [id number]  PCP:  Nathaniel Gowda.August Saucer, MD   [facility] Providers Cardiologist:  Nicki Guadalajara, MD     Referring MD: Nathaniel Gowda.August Saucer, MD   Chief Complaint  Patient presents with   Follow-up    Seen for Dr. Tresa Endo    History of Present Illness:    Nathaniel Jones is a 40 y.o. male with a hx of obesity, hypertension, obstructive sleep apnea and pericarditis.  He was a former Insurance account manager.  Myoview and echocardiogram were normal in 2018.  He also has obstructive sleep apnea with excessive daytime sleepiness.  Unfortunately he developed COVID-19 infection in December 2021.  He returned in January with fever and chest pain. Patient had prior pericarditis in January 2022.  CTA was negative for PE.  CRP and procalcitonin were elevated.  Echocardiogram showed EF 60 to 65%, grade 1 DD, there was no mention of pericardial effusion.  He was treated with Rocephin and azithromycin for possible community-acquired pneumonia.  He was also prescribed colchicine as well.  Cardiac MRI obtained on 01/15/2021 showed EF 60%, mild LVH, normal pericardium with no delayed enhancement, 41 mm mildly dilated ascending aorta at the sinus of Valsalva.  No findings to suggest myocarditis.  No myocardial edema.  No postcontrast delayed myocardial enhancement.  More recently, he had recurrent pleuritic chest pain in the past 2 weeks.  He was seen by Dr. Flora Lipps on 07/22/2021, Dr. Flora Lipps felt the symptom sound similar to pericarditis but not classic, he suspected he has pleurisy related to recent chest cold.  It was recommended he start on indomethacin 25 mg 3 times a day for 7 days.  He was also recommended to continue PPI.  CRP was elevated at 19, sed rate normal at 8.  Patient called our office yesterday complaining of worsening symptom, he was recommended to go to the emergency room however he did not wish to do so.  Patient presents  today for follow-up.  He says his current symptoms remind him of the exact same symptom when he had pericarditis at age 20 and also earlier this year.  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  He has sharp chest pain with deep inspiration and laying down.  He denies any cough.  He has been 2 to 3 weeks since he had a chest cold.  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  Dr. Bufford Buttner spoke to me earlier today and is sending a prescription for indomethacin down titrating dose for the next 2 months.  He is on Protonix for GI protection.  He was able to tolerate colchicine earlier this year, I have prescribed him 29-month of colchicine as well.  Initially, he was talking about getting an antibiotic at the same time as he always had antibiotic treatment when he was treated for pericarditis previously.  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  There is no current clinical indication to give him antibiotic at this time.  I recommended 88-month follow-up with Dr. Tresa Endo.   Past Medical History:  Diagnosis Date   Hypertension    Low back pain    herniated disc   Obesity    OSA (obstructive sleep apnea)    has not yet received CPAP   Pericarditis    in his 43s  Past Surgical History:  Procedure Laterality Date   KNEE SURGERY Right     Current Medications: Current Meds  Medication Sig   acetaminophen (TYLENOL) 500 MG tablet Take 1,000 mg by mouth 3 (three) times daily as needed for fever or headache (pain).   allopurinol (ZYLOPRIM) 100 MG tablet Take 100 mg by mouth daily as needed (gout attacks).   Biotin 1000 MCG tablet Take 1,000 mcg by mouth daily.   Cranberry 300 MG tablet Take 300 mg by mouth daily.   cyclobenzaprine (FLEXERIL) 5 MG tablet Take 1 tablet (5 mg total) by mouth 3 (three) times daily as needed.   Ferrous Sulfate (IRON) 325 (65 Fe) MG TABS Take 325 mg by mouth  daily.   Glucosamine-Chondroitin 500-400 MG CAPS Take 1 capsule by mouth daily.   indomethacin (INDOCIN) 25 MG capsule Take 2 tablets (50 mg) three times daily for 14 days, then take 2 tablets (50 mg) twice daily for 14 days, then take 2 tablets (50 mg) once daily for 14 days, then take 1 tablet (25 mg) once daily for 14 days.   Lysine 500 MG CAPS Take 500 mg by mouth daily.   metoprolol succinate (TOPROL XL) 50 MG 24 hr tablet Take 1 tablet (50 mg total) by mouth daily. Take with or immediately following a meal.   Misc Natural Products (TART CHERRY ADVANCED) CAPS Take 1 capsule by mouth daily.   Multiple Vitamin (MULTIVITAMIN WITH MINERALS) TABS tablet Take 1 tablet by mouth daily.   Omega-3 Fatty Acids (FISH OIL) 1000 MG CAPS Take 1,000 mg by mouth daily.   pantoprazole (PROTONIX) 40 MG tablet Take 1 tablet (40 mg total) by mouth daily.   pyridOXINE (VITAMIN B-6) 100 MG tablet Take 100 mg by mouth daily.   QUEtiapine (SEROQUEL) 25 MG tablet Take 12.5 mg by mouth at bedtime as needed (sleep).   Saw Palmetto, Serenoa repens, (SAW PALMETTO EXTRACT) 160 MG CAPS Take 160 mg by mouth daily.   sildenafil (VIAGRA) 100 MG tablet Take 100 mg by mouth daily as needed for erectile dysfunction.   Specialty Vitamins Products (COLLAGEN ULTRA) CAPS Take 1 capsule by mouth daily.   Turmeric 500 MG CAPS Take 500 mg by mouth daily.   vitamin B-12 (CYANOCOBALAMIN) 1000 MCG tablet Take 1,000 mcg by mouth daily.   [DISCONTINUED] colchicine 0.6 MG tablet Take 1 tablet (0.6 mg total) by mouth daily as needed (gout attacks).     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  Tobacco Use   Smoking status: Never   Smokeless tobacco: Never  Substance and Sexual Activity    Alcohol use: No   Drug use: No   Sexual activity: Not on file  Other Topics Concern   Not on file  Social History Narrative   Not on file   Social Determinants of Health   Financial Resource Strain: Not on file  Food Insecurity: Not on file  Transportation Needs: Not on file  Physical Activity: Not on file  Stress: Not on file  Social Connections: Not on file     Family History: The patient's family history includes Diabetes in his brother, maternal grandmother, mother, and another family member; Heart disease in  his father; High Cholesterol in his maternal grandmother; Hypertension in his father, mother, and another family member; Stroke in his maternal grandfather and maternal grandmother. There is no history of Autoimmune disease.  ROS:   Please see the history of present illness.     All other systems reviewed and are negative.  EKGs/Labs/Other Studies Reviewed:    The following studies were reviewed today:  Echo 10/31/2020  1. Left ventricular ejection fraction, by estimation, is 60 to 65%. The  left ventricle has normal function. The left ventricle has no regional  wall motion abnormalities. There is moderate left ventricular hypertrophy.  Left ventricular diastolic  parameters are consistent with Grade I diastolic dysfunction (impaired  relaxation).   2. Right ventricular systolic function is normal. The right ventricular  size is normal. There is normal pulmonary artery systolic pressure.   3. Left atrial size was mildly dilated.   4. The mitral valve is normal in structure. Trivial mitral valve  regurgitation. No evidence of mitral stenosis.   5. The aortic valve is normal in structure. Aortic valve regurgitation is  not visualized. No aortic stenosis is present.   6. The inferior vena cava is normal in size with greater than 50%  respiratory variability, suggesting right atrial pressure of 3 mmHg.   EKG:  EKG is ordered today.  The ekg ordered today demonstrates  normal sinus rhythm, no significant ST-T wave changes  Recent Labs: 10/31/2020: Magnesium 2.5 11/18/2020: BNP 5.9; Platelets 263; TSH 0.603 12/04/2020: ALT 47; BUN 13; Creatinine, Ser 1.09; Hemoglobin 12.9; Potassium 3.9; Sodium 143  Recent Lipid Panel No results found for: CHOL, TRIG, HDL, CHOLHDL, VLDL, LDLCALC, LDLDIRECT   Risk Assessment/Calculations:           Physical Exam:    VS:  BP 130/90   Pulse 60   Ht 6\' 4"  (1.93 m)   Wt (!) 315 lb 12.8 oz (143.2 kg)   SpO2 97%   BMI 38.44 kg/m     Wt Readings from Last 3 Encounters:  08/03/21 (!) 315 lb 12.8 oz (143.2 kg)  07/30/21 (!) 315 lb (142.9 kg)  02/15/21 (!) 319 lb (144.7 kg)     GEN:  Well nourished, well developed in no acute distress HEENT: Normal NECK: No JVD; No carotid bruits LYMPHATICS: No lymphadenopathy CARDIAC: RRR, no murmurs, rubs, gallops RESPIRATORY:  Clear to auscultation without rales, wheezing or rhonchi  ABDOMEN: Soft, non-tender, non-distended MUSCULOSKELETAL:  No edema; No deformity  SKIN: Warm and dry NEUROLOGIC:  Alert and oriented x 3 PSYCHIATRIC:  Normal affect   ASSESSMENT:    1. Acute pericarditis, unspecified type   2. 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  He did have a recent chest cold.  Symptom is worse with deep inspiration or lying down.  He was prescribed NSAIDs by Dr. Audie Box, I will add 75-month course of colchicine as well.  Patient initially requested antibiotic, however recent symptom sounds more viral rather than bacterial.  Since the chest cold has largely improved, I do not recommend any antibiotic therapy at this time.  Hypertension: Continue on current therapy.        Medication Adjustments/Labs and Tests Ordered: Current medicines are reviewed at length with the patient today.  Concerns regarding medicines are outlined above.  Orders Placed This Encounter   Procedures   EKG 12-Lead   Meds ordered this encounter  Medications   colchicine 0.6 MG tablet    Sig: Take 1 tablet (0.6 mg total) by mouth 2 (two) times daily.    Dispense:  60 tablet    Refill:  1    Patient Instructions  Medication Instructions:  INCREASE Colchicine to 0.6 mg 2 times a day  Follow the instructions for the Indomethacin (Indocin) 25 mg as follows: Take 2 tablets (50 mg) three times daily for 14 days, then take 2 tablets (50 mg) twice daily for 14 days, then take 2 tablets (50 mg) once daily for 14 days, then take 1 tablet (25 mg) once daily for 14 days. *If you need a refill on your cardiac medications before your next appointment, please call your pharmacy*  Lab Work: NONE ordered at this time of appointment   If you have labs (blood work) drawn today and your tests are completely normal, you will receive your results only by: [place] (if you have MyChart) OR A paper copy in the mail If you have any lab test that is abnormal or we need to change your treatment, we will call you to review the results.  Testing/Procedures: NONE ordered at this time of appointment   Follow-Up: At [facility], you and your health needs are our priority.  As part of our continuing mission to provide you with exceptional heart care, we have created designated Provider Care Teams.  These Care Teams include your primary Cardiologist (physician) and Advanced Practice Providers (APPs -  Physician Assistants and Nurse Practitioners) who all work together to provide you with the care you need, when you need it.  Your next appointment:   As scheduled    The format for your next appointment:   In Person  Provider:   Shelva Majestic, MD  Other Instructions    Signed, Almyra Deforest, [place]  08/05/2021 11:46 PM    [place]

## 2021-08-05 ENCOUNTER — Encounter: Payer: Self-pay | Admitting: Physician Assistant

## 2021-08-18 ENCOUNTER — Telehealth: Payer: Self-pay | Admitting: Cardiovascular Disease

## 2021-08-18 NOTE — Telephone Encounter (Signed)
Patient is called stating he has walking pneumonia.  They prescribed him a steroid and antibiotics, but he wants to know if he should be taking the steroid because of the inflammation around his heart.

## 2021-08-18 NOTE — Telephone Encounter (Signed)
Triage message: Patient is called stating he has walking pneumonia.  They prescribed him a steroid and antibiotics, but he wants to know if he should be taking the steroid because of the inflammation around his heart.   Returned patient call regarding his concern of taking levofloxacin and prednisone that was ordered at urgent care today. He wants to know if theses 2 medications will have an effect with the other medications he is currently taking.

## 2021-08-19 NOTE — Telephone Encounter (Signed)
Reviewed with Dr. Tresa Endo.  Pt last EKG showed QT interval at 402.  Yesterday was prescribed levofloxacin and prednisone for walking pneumonia.  Patient okay to take both medications.  Returned call and explained this to patient.

## 2021-08-24 ENCOUNTER — Encounter (HOSPITAL_BASED_OUTPATIENT_CLINIC_OR_DEPARTMENT_OTHER): Payer: Self-pay

## 2021-08-24 ENCOUNTER — Other Ambulatory Visit: Payer: Self-pay

## 2021-08-24 ENCOUNTER — Emergency Department (HOSPITAL_BASED_OUTPATIENT_CLINIC_OR_DEPARTMENT_OTHER)
Admission: EM | Admit: 2021-08-24 | Discharge: 2021-08-24 | Disposition: A | Discharge status: Home / Self Care | Payer: 59 | Attending: Emergency Medicine | Admitting: Emergency Medicine

## 2021-08-24 ENCOUNTER — Emergency Department (HOSPITAL_BASED_OUTPATIENT_CLINIC_OR_DEPARTMENT_OTHER): Payer: 59

## 2021-08-24 DIAGNOSIS — Z20822 Contact with and (suspected) exposure to covid-19: Secondary | ICD-10-CM | POA: Diagnosis not present

## 2021-08-24 DIAGNOSIS — Z79899 Other long term (current) drug therapy: Secondary | ICD-10-CM | POA: Diagnosis not present

## 2021-08-24 DIAGNOSIS — R0601 Orthopnea: Secondary | ICD-10-CM | POA: Insufficient documentation

## 2021-08-24 DIAGNOSIS — R079 Chest pain, unspecified: Secondary | ICD-10-CM

## 2021-08-24 DIAGNOSIS — I10 Essential (primary) hypertension: Secondary | ICD-10-CM | POA: Diagnosis not present

## 2021-08-24 DIAGNOSIS — R0602 Shortness of breath: Secondary | ICD-10-CM | POA: Insufficient documentation

## 2021-08-24 LAB — COMPREHENSIVE METABOLIC PANEL
ALT: 23 U/L (ref 0–44)
AST: 17 U/L (ref 15–41)
Albumin: 4 g/dL (ref 3.5–5.0)
Alkaline Phosphatase: 61 U/L (ref 38–126)
Anion gap: 7 (ref 5–15)
BUN: 15 mg/dL (ref 6–20)
CO2: 26 mmol/L (ref 22–32)
Calcium: 8.8 mg/dL — ABNORMAL LOW (ref 8.9–10.3)
Chloride: 108 mmol/L (ref 98–111)
Creatinine, Ser: 1.05 mg/dL (ref 0.61–1.24)
GFR, Estimated: >60 mL/min (ref >60)
Glucose, Bld: 127 mg/dL — ABNORMAL HIGH (ref 70–99)
Potassium: 3.9 mmol/L (ref 3.5–5.1)
Sodium: 141 mmol/L (ref 135–145)
Total Bilirubin: 0.7 mg/dL (ref 0.3–1.2)
Total Protein: 6.9 g/dL (ref 6.5–8.1)

## 2021-08-24 LAB — CBC WITH DIFFERENTIAL/PLATELET
Abs Immature Granulocytes: 0.13 10*3/uL — ABNORMAL HIGH (ref 0.00–0.07)
Basophils Absolute: 0 10*3/uL (ref 0.0–0.1)
Basophils Relative: 0 %
Eosinophils Absolute: 0.1 10*3/uL (ref 0.0–0.5)
Eosinophils Relative: 1 %
HCT: 40.7 % (ref 39.0–52.0)
Hemoglobin: 13.7 g/dL (ref 13.0–17.0)
Immature Granulocytes: 1 %
Lymphocytes Relative: 28 %
Lymphs Abs: 4.7 10*3/uL — ABNORMAL HIGH (ref 0.7–4.0)
MCH: 29.3 pg (ref 26.0–34.0)
MCHC: 33.7 g/dL (ref 30.0–36.0)
MCV: 87 fL (ref 80.0–100.0)
Monocytes Absolute: 1.6 10*3/uL — ABNORMAL HIGH (ref 0.1–1.0)
Monocytes Relative: 9 %
Neutro Abs: 10.2 10*3/uL — ABNORMAL HIGH (ref 1.7–7.7)
Neutrophils Relative %: 61 %
Platelets: 221 10*3/uL (ref 150–400)
RBC: 4.68 MIL/uL (ref 4.22–5.81)
RDW: 13.6 % (ref 11.5–15.5)
WBC: 16.8 10*3/uL — ABNORMAL HIGH (ref 4.0–10.5)
nRBC: 0 % (ref 0.0–0.2)

## 2021-08-24 LAB — RESP PANEL BY RT-PCR (FLU A&B, COVID) ARPGX2
Influenza A by PCR: NEGATIVE
Influenza B by PCR: NEGATIVE
SARS Coronavirus 2 by RT PCR: NEGATIVE

## 2021-08-24 LAB — LIPASE, BLOOD: Lipase: 17 U/L (ref 11–51)

## 2021-08-24 LAB — BRAIN NATRIURETIC PEPTIDE: B Natriuretic Peptide: 63.4 pg/mL (ref 0.0–100.0)

## 2021-08-24 LAB — D-DIMER, QUANTITATIVE: D-Dimer, Quant: <0.27 ug/mL-FEU (ref 0.00–0.50)

## 2021-08-24 LAB — TROPONIN I (HIGH SENSITIVITY): Troponin I (High Sensitivity): 3 ng/L (ref <18)

## 2021-08-24 NOTE — ED Triage Notes (Signed)
Chest pain and shortness of breath, ongoing since October, but becoming worse over the last week.

## 2021-08-24 NOTE — Discharge Instructions (Signed)
Fortunately, we did not find any emergent pathology.  Unfortunately, we did not figure out the source of your pain.  Your cardiologist will be able to see the work-up we did in the chart, but specifically we did not find any signs of heart failure, pneumonia, acute coronary syndrome or evidence of pulmonary embolism.  Please call your cardiologist tomorrow morning to see what the neck steps are.  I hope you get to feeling better soon.

## 2021-08-24 NOTE — ED Provider Notes (Signed)
[facility] DEPT Provider Note   CSN: [id number] Arrival date & time: 08/24/21  2007     History Chief Complaint  Patient presents with   Chest Pain   Shortness of Breath    Nathaniel Jones is a 40 y.o. male.   Chest Pain Associated symptoms: nausea and shortness of breath   Associated symptoms: no abdominal pain, no back pain, no cough, no fever, no palpitations and no vomiting   Shortness of Breath Associated symptoms: chest pain   Associated symptoms: no abdominal pain, no cough, no ear pain, no fever, no rash, no sore throat and no vomiting    Patient with history of pericarditis, OSA, obesity, hypertension presents due to chest pain and shortness of breath.  History of pericarditis, was seen in early October by his cardiology group for worsening chest pain consistent with pericarditis.  He is having the same pain today, usually feels dull and it comes and goes.  Its worsened by his lying down flat, alleviated by leaning forward.  He is currently taking colchicine, was on indomethacin.  He went to urgent care on 08/18/2021 due to concerns about worsening chest pain and shortness of breath, they did an x-ray and started him on Levaquin and prednisone for walking pneumonia.  He is currently taking both of these medicine has a few days left.  He comes to the ED today because he is having worsening shortness of breath that is waking him up from his sleep at night.  The chest pain is also constant although it has improved with the colchicine.  Past Medical History:  Diagnosis Date   Hypertension    Low back pain    herniated disc   Obesity    OSA (obstructive sleep apnea)    has not yet received CPAP   Pericarditis    in his 34s    Patient Active Problem List   Diagnosis Date Noted   CAP (community acquired pneumonia) 10/31/2020   Pericarditis, viral 10/31/2020   Essential hypertension 10/31/2020   OSA (obstructive sleep apnea) 10/31/2020   Sleep  disturbance 10/15/2020   Rectal bleeding 10/15/2020   Primary generalized (osteo)arthritis 10/15/2020   Prediabetes 10/15/2020   Obesity 10/15/2020   Neck pain 10/15/2020   Muscle contraction headache 10/15/2020   Decreased testosterone level 10/15/2020   Suspected COVID-19 virus infection 12/24/2019   Unilateral primary osteoarthritis, left knee 05/17/2018   Unilateral primary osteoarthritis, right knee 05/17/2018   Chronic pain of both knees 05/17/2018   Biceps rupture, proximal, right, initial encounter 05/22/2017   Chronic bilateral low back pain without sciatica 03/08/2017   Lateral epicondylitis, right elbow 03/08/2017   It band syndrome, left 03/08/2017    Past Surgical History:  Procedure Laterality Date   KNEE SURGERY Right        Family History  Problem Relation Age of Onset   Hypertension Other    Diabetes Other    Hypertension Mother    Diabetes Mother    Hypertension Father    Heart disease Father        Unclear details   Diabetes Brother    Stroke Maternal Grandmother    Diabetes Maternal Grandmother    High Cholesterol Maternal Grandmother    Stroke Maternal Grandfather    Autoimmune disease Neg Hx     Social History   Tobacco Use   Smoking status: Never   Smokeless tobacco: Never  Substance Use Topics   Alcohol use: No   Drug use:  No    Home Medications Prior to Admission medications   Medication Sig Start Date End Date Taking? Authorizing Provider  acetaminophen (TYLENOL) 500 MG tablet Take 1,000 mg by mouth 3 (three) times daily as needed for fever or headache (pain).    [provider]  allopurinol (ZYLOPRIM) 100 MG tablet Take 100 mg by mouth daily as needed (gout attacks). 06/05/20   [provider]  Biotin 1000 MCG tablet Take 1,000 mcg by mouth daily.    [provider]  colchicine 0.6 MG tablet Take 1 tablet (0.6 mg total) by mouth 2 (two) times daily. 08/03/21 10/02/21  Almyra Deforest, PA  Cranberry 300 MG tablet  Take 300 mg by mouth daily.    [provider]  cyclobenzaprine (FLEXERIL) 5 MG tablet Take 1 tablet (5 mg total) by mouth 3 (three) times daily as needed. 09/10/20   Pete Pelt, PA-C  Ferrous Sulfate (IRON) 325 (65 Fe) MG TABS Take 325 mg by mouth daily.    [provider]  Glucosamine-Chondroitin 500-400 MG CAPS Take 1 capsule by mouth daily.    [provider]  indomethacin (INDOCIN) 25 MG capsule Take 2 tablets (50 mg) three times daily for 14 days, then take 2 tablets (50 mg) twice daily for 14 days, then take 2 tablets (50 mg) once daily for 14 days, then take 1 tablet (25 mg) once daily for 14 days. 08/03/21   Geralynn Rile, MD  Lysine 500 MG CAPS Take 500 mg by mouth daily.    [provider]  metoprolol succinate (TOPROL XL) 50 MG 24 hr tablet Take 1 tablet (50 mg total) by mouth daily. Take with or immediately following a meal. 02/15/21   Troy Sine, MD  Misc Natural Products [facility] ADVANCED) CAPS Take 1 capsule by mouth daily.    [provider]  Multiple Vitamin (MULTIVITAMIN WITH MINERALS) TABS tablet Take 1 tablet by mouth daily.    [provider]  Omega-3 Fatty Acids (FISH OIL) 1000 MG CAPS Take 1,000 mg by mouth daily.    [provider]  pantoprazole (PROTONIX) 40 MG tablet Take 1 tablet (40 mg total) by mouth daily. 11/18/20   [facility], Tami [name], PA  pyridOXINE (VITAMIN B-6) 100 MG tablet Take 100 mg by mouth daily.    [provider]  QUEtiapine (SEROQUEL) 25 MG tablet Take 12.5 mg by mouth at bedtime as needed (sleep).    [provider]  Saw Palmetto, Serenoa repens, (SAW PALMETTO EXTRACT) 160 MG CAPS Take 160 mg by mouth daily.    [provider]  sildenafil (VIAGRA) 100 MG tablet Take 100 mg by mouth daily as needed for erectile dysfunction. 05/13/20   [provider]  Specialty Vitamins Products (COLLAGEN ULTRA) CAPS Take 1 capsule by mouth daily.     [provider]  Turmeric 500 MG CAPS Take 500 mg by mouth daily.    [provider]  vitamin B-12 (CYANOCOBALAMIN) 1000 MCG tablet Take 1,000 mcg by mouth daily.    [provider]    Allergies    Coconut oil and Ibuprofen  Review of Systems   Review of Systems  Constitutional:  Negative for chills and fever.  HENT:  Negative for ear pain and sore throat.   Eyes:  Negative for pain and visual disturbance.  Respiratory:  Positive for shortness of breath. Negative for cough.   Cardiovascular:  Positive for chest pain. Negative for palpitations.  Gastrointestinal:  Positive for nausea. Negative for abdominal pain and vomiting.  Genitourinary:  Negative for dysuria and hematuria.  Musculoskeletal:  Negative for arthralgias and back pain.  Skin:  Negative for color change and rash.  Neurological:  Negative for seizures and syncope.  All other systems reviewed and are negative.  Physical Exam Updated Vital Signs BP (!) 185/105 (BP Location: Right Arm)   Pulse 69   Resp 20   Ht $R'6\' 4"'Lo$  (1.93 m)   Wt (!) 142.9 kg   SpO2 100%   BMI 38.34 kg/m   Physical Exam Vitals and nursing note reviewed. Exam conducted with a chaperone present.  Constitutional:      Appearance: Normal appearance.  HENT:     Head: Normocephalic and atraumatic.  Eyes:     General: No scleral icterus.       Right eye: No discharge.        Left eye: No discharge.     Extraocular Movements: Extraocular movements intact.     Pupils: Pupils are equal, round, and reactive to light.  Cardiovascular:     Rate and Rhythm: Normal rate and regular rhythm.     Pulses: Normal pulses.     Heart sounds: Normal heart sounds. No murmur heard.   No friction rub. No gallop.  Pulmonary:     Effort: Pulmonary effort is normal. No respiratory distress.     Breath sounds: Normal breath sounds.     Comments: Lungs are clear to auscultation bilaterally, right-sided breath sounds sounds somewhat  diminished compared to the left but no appreciable wheezes or rales Chest:     Chest wall: No tenderness.     Comments: S1-S2, no appreciable murmurs.  Radial pulses equal 2+, no reproducible chest wall tenderness Abdominal:     General: Abdomen is flat. Bowel sounds are normal. There is no distension.     Palpations: Abdomen is soft.     Tenderness: There is no abdominal tenderness.  Musculoskeletal:     Right lower leg: No edema.     Left lower leg: No edema.  Skin:    General: Skin is warm and dry.     Coloration: Skin is not jaundiced.  Neurological:     Mental Status: He is alert. Mental status is at baseline.     Coordination: Coordination normal.   ED Results / Procedures / Treatments   Labs (all labs ordered are listed, but only abnormal results are displayed) Labs Reviewed  RESP PANEL BY RT-PCR (FLU A&B, COVID) [id number]  COMPREHENSIVE METABOLIC PANEL  LIPASE, BLOOD  BRAIN NATRIURETIC PEPTIDE  CBC WITH DIFFERENTIAL/PLATELET  TROPONIN I (HIGH SENSITIVITY)    EKG None  Radiology No results found.  Procedures Procedures   Medications Ordered in ED Medications - No data to display  ED Course  I have reviewed the triage vital signs and the nursing notes.  Pertinent labs & imaging results that were available during my care of the patient were reviewed by me and considered in my medical decision making (see chart for details).    MDM Rules/Calculators/A&P                           Patient is hypertensive, otherwise vitals are stable.  No tachycardia, no hypoxia.  He is complaining of shortness of breath and orthopnea, no history of heart failure so we will check a BNP and as well as troponin due to the consistent chest pain.  EKG ordered,  does not show any findings of diffuse ST elevations or PR depressions that be concerning for pericarditis.  Patient has a complicated medical history.  Notable highlights including imaging and most recent cardiology visits are  detailed below.  Last ECHO January 2022:  -LVEF 60 to 65% with normal function.  There is moderate left ventricular hypertrophy.   -Left ventricular diastolic parameters were consistent with grade 1 diastolic dysfunction.  CMR 01/18/2021:   1. Normal left ventricular chamber size and function. Mild LVH. LVEF 60%  2.  Normal right ventricular size and function.  RVEF 53%  3. No post contrast delayed myocardial enhancement. No evidence of scar, fibrosis, inflammation or infarction. No myocardial edema. No findings to suggest myocarditis.  4.  Normal pericardium with no delayed enhancement. 5. Mild ascending aorta dilation, 41 mm at sinus of Valsalva measured in transverse plane (not orthogonal).  Additional history: Was by cardiology 07/30/2021 for concerns about recurrent pericarditis.  Suspicion was very low at the time based on the CMR, ESR and CRP were taken at that time.  Started on indomethacin, that seems to have helped. Seen again 08/03/2021.  Started on colchicine for pleurisy/chest pain.   He was then seen 08/18/2021 at the urgent care and started on Levaquin and prednisone.  He is currently still taking it.  Work-up: Patient has a leukocytosis of 16, this could be contributory to him being on prednisone.  No left shift.  The radiograph does not show any signs of pneumonia, no cardiomegaly or enlarged heart or vascular congestion.  This in conjunction with the BNP being within normal limits makes acute heart failure unlikely.  Negative troponin, pain is been constant for over a day so do not think we need a second.  Additionally, no ST elevations concerning for ACS.  No PR depressions or be concerning for pericarditis, additionally no global ST elevations.  Patient is not tachycardic, discussed getting a dimer with my attending that resulted negative making blood clots highly unlikely.  No gross electrolyte derangement on CMP.  Patient is also COVID and flu negative.  There is no wheezing on  exam, no underlying history of pulmonary disease.  Although I do not have a definitive answer for his symptoms, he has stable vitals and is not in any acute pain.  He actually states his pain is improved resting in the room.  At this time I do not think more emergent work-up is warranted, discharge patient in stable condition.  Discussed this case with my attending Dr. Ronnald Nian who agrees with my plan. He did not personally evaluate the patient but was involved in my medical decision-making and reviewed the results of his work-up.   Final Clinical Impression(s) / ED Diagnoses Final diagnoses:  None    Rx / DC Orders ED Discharge Orders     None        Sherrill Raring, PA-C 08/24/21 2148    Lennice Sites, DO 08/24/21 2250

## 2021-08-25 ENCOUNTER — Ambulatory Visit (INDEPENDENT_AMBULATORY_CARE_PROVIDER_SITE_OTHER): Payer: 59 | Admitting: Cardiovascular Disease

## 2021-08-25 ENCOUNTER — Telehealth: Payer: Self-pay | Admitting: Cardiovascular Disease

## 2021-08-25 ENCOUNTER — Encounter: Payer: Self-pay | Admitting: Cardiovascular Disease

## 2021-08-25 DIAGNOSIS — R0781 Pleurodynia: Secondary | ICD-10-CM

## 2021-08-25 DIAGNOSIS — Z6839 Body mass index (BMI) 39.0-39.9, adult: Secondary | ICD-10-CM

## 2021-08-25 DIAGNOSIS — I10 Essential (primary) hypertension: Secondary | ICD-10-CM

## 2021-08-25 DIAGNOSIS — G4733 Obstructive sleep apnea (adult) (pediatric): Secondary | ICD-10-CM

## 2021-08-25 DIAGNOSIS — R002 Palpitations: Secondary | ICD-10-CM | POA: Diagnosis not present

## 2021-08-25 DIAGNOSIS — E6609 Other obesity due to excess calories: Secondary | ICD-10-CM

## 2021-08-25 DIAGNOSIS — B3323 Viral pericarditis: Secondary | ICD-10-CM

## 2021-08-25 MED ORDER — SPIRONOLACTONE 25 MG PO TABS: 12.5000 mg | ORAL_TABLET | Freq: Every day | ORAL | 3 refills | Status: DC

## 2021-08-25 MED ORDER — AMLODIPINE BESYLATE 5 MG PO TABS
5.0000 mg | ORAL_TABLET | Freq: Every day | ORAL | 3 refills | Status: DC
Start: 2021-08-25 — End: 2021-11-08

## 2021-08-25 NOTE — Telephone Encounter (Signed)
Spoke with patient of Dr. Tresa Endo who has had a few visits over the last month, being treated for pericarditis. He was in ED yesterday - reports chest pain, shortness of breath. Advised we can have him see MD today at 3:40pm for ED follow up. He would like this. Appointment scheduled.

## 2021-08-25 NOTE — Progress Notes (Signed)
Cardiology Office Note    Date:  09/01/2021   ID:  Nathaniel Jones, DOB [birth year], MRN [id number]  PCP:  Nathaniel Graff.Marlou Sa, MD  Cardiologist:  Nathaniel Majestic, MD   Seen as an add-on for recurrent pleuritic chest pain.  History of Present Illness:  Nathaniel Jones is a 40 y.o. male who I had seen in the past for evaluation of chest pain with atypical features as well as sleepiness.  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  Since his playing days when his weight was 255 pounds he has gained weight and has been around 315.  He has a history of hypertension.  In 2018 he had a normal nuclear stress test and an echo Doppler study showed an EF of 55% with moderate LVH with mild left atrial enlargement and mild TR.  He has obstructive sleep apnea and has had issues with significant excessive daytime sleepiness.  Remotely had been evaluated by Dr. Rexene Jones and was reevaluated by me on a home study in October 2021 which showed mild overall sleep apnea with an AHI of 10 but in the past he had been noted to have severe OSA during REM sleep which was unable to be assessed on the home study.  He never had a CPAP titration or AutoPap initiation.  He developed COVID-19 infection on October 02, 2020.  He presented to the emergency room on October 27, 2020 with headaches, chills myalgias and fever.  He was sent home.  He returned on January 27 with neck pain and significant headache and apparently had a spinal tap which was negative.  A chest x-ray was suspicious for possible pneumonia and he was discharged on Augmentin.  He represented the next day with left precordial chest pain and low-grade fever as well as mild cough.  He was admitted to the hospital on October 31, 2020.  CT of the chest was negative for PE and was felt he had pericarditis and possible consolidation of his right middle upper lobe.  He continued to have low fever.  He was felt to have community-acquired  pneumonia/post Covid inflammatory syndrome.  Blood cultures were negative.  Procalcitonin was greater than 4, CRP was 40.  Repeat Covid 19 PCR test was negative.  An echo Doppler study showed an EF of 60 to 65% with grade 1 diastolic dysfunction there was no mention of any pericardial effusion.  He was treated with Rocephin and azithromycin and was recommended to take colchicine and continue allopurinol with his history of gout.  The patient was to have a repeat chest x-ray after completion of antibiotic therapy.  He was evaluated by Nathaniel Jones on November 18, 2020 and admitted to feeling terrible.  He had finished his antibiotic therapy for pneumonia and sinusitis and admitted to being breathless after 1 flight of steps and even taking a shower left knee is significantly fatigued.  He has not yet returned to work.  Due to diagnosis of pericarditis it was recommended he complete 3 months of colchicine therapy she started him on Relafen instead of ibuprofen but he did not notice any significant change and she had a Protonix 40 mg daily.    I saw him on December 04, 2020 at which time he continued to experience increasing shortness of breath with activity and admitted to being very weak.  He was experiencing episodes of Jones stabbing pain when he was lying down on the left chest region.  He no longer had a fever.  He had not had a follow-up chest x-ray.  He was not sleeping well.  I recommended he undergo a PA and lateral chest x-ray as well as laboratory.  CRP was 1.  Hemoglobin and hematocrit were 12.9 and 39.5.  Creatinine was 1.09.  Erythrocyte sedimentation rate had improved and was now 18 compared to 98 in January and 40 on November 18, 2020.  A chest x-ray January 15, 2021 was negative for acute cardiopulmonary disease.  I scheduled him for cardiac MRI which was done on January 18, 2021 was essentially normal and showed normal LV chamber size with mild increased wall thickness of 12 mm.  LVEF was 60% without  regional wall motion abnormalities.  There was no evidence for myocardial edema.  There was no postcontrast delayed myocardial enhancement.  RV function was normal with an EF of 53%.  Valves were normal.  The pericardium was normal.  His aorta was mildly dilated at the level of the sinus Valsalva measuring 41 mm.  I saw him on January 18, 2021 in follow-up office visit.  At that time he felt better but had not yet returned to work.  However he was still experiencing some pain while at sleeping on his back and reason was sleeping on his left side.  He described the pain as a knifelike sensation is also noted a vague residual dullness.  During that evaluation, I recommended that he can return to work.  I felt his chest pain was nonischemic and chest wall discomfort.  He was still waiting for his machine and I recommended institution of AutoPap therapy.  I last saw him as an add-on on Feb 15, 2021.  Since his previous evaluation he had  called the office on January 28, 2021 after he experienced recurrent Jones chest pain episodes after he had had a heated discussion with his supervisor at work.  He had noted that his heart rate increases when he lies on his back even before he falls asleep with heart rates in the upper 90s.  He has tried instituting AutoPap therapy.  However he is only been sleeping several hours with it.  He has been having difficulty with the full facemask that was provided to him which covers the nose and mouth.  He was added onto my schedule for follow-up evaluation.  During that evaluation, his ECG showed sinus rhythm at 72 bpm with previously noted T wave abnormality in leads III and aVF.  He was experiencing knifelike chest pain which seem to be precipitated by [place] and was taking ibuprofen 600 mg twice a day with benefit.  With his recent heart rate increase I suggested a trial of increasing metoprolol succinate from 25 to 50 mg.  I had a long discussion with him regarding his initiation of CPAP  therapy.  Apparently, Nathaniel Jones CPAP machine was picked up by the DME company on August 2 due to very poor compliance and no recent usage.  He was seen by Dr. Grayling Congress on July 30, 2021 complaining of 2 to 3 weeks of intermittent chest tightness more described as a Jones pain on the right which then moved to his left chest.  It also had pleuritic component being worse with deep inspiration.  The time he was very concerned about recurrent pericarditis.  Dr. Davina Poke was not convinced this was pericarditis and that may be just pleurisy related to a chest cold.  However sed rate and CRP was obtained and  he was started on therapy with indomethacin 25 mg 3 times a day for 1 week.  Sedimentation rate was normal at 8 and C-reactive protein was increased at 19.  Subsequently, he was reevaluated on August 03, 2021 Almyra Deforest, [place] after he had called the office complaining of worsening symptoms.  He was complaining of pleuritic chest pain with Jones pain with deep inspiration and laying down which was the same pain that he had experienced with pericarditis.  At that time, he was represcribed colchicine 0.6 mg twice a day and he was wondering about getting an antibiotic but it was felt that most likely he had a nonbacterial etiology and antibiotic was not prescribed.  He was evaluated in urgent care on November 16 due to concerns about worsening chest pain and shortness of breath.  A chest x-ray was obtained and he was started on Levaquin and prednisone for presumed walking pneumonia.  August 24, 2021 he read presented to [facility] ER at [place] with shortness of breath that was waking him up from sleep at night and similar pleuritic-like chest pain.  D-dimer was negative.  White blood count was 16,800, but he was on prednisone.  Glucose had increased to 127.  COVID test was negative.  Troponins were negative.  BNP was normal at 63.4.  He is now added onto my schedule today.   His blood pressure has been elevated.  He  continues to experience some twinges of chest pain when he takes a deep breath but actually the symptoms have somewhat improved since initiation of colchicine and he has continued to be on indomethacin 50 mg twice a day.  He is sleeping poorly.  He presents for evaluation.  Past Medical History:  Diagnosis Date   Hypertension    Low back pain    herniated disc   Obesity    OSA (obstructive sleep apnea)    has not yet received CPAP   Pericarditis    in his 10s    Past Surgical History:  Procedure Laterality Date   KNEE SURGERY Right     Current Medications: Outpatient Medications Prior to Visit  Medication Sig Dispense Refill   acetaminophen (TYLENOL) 500 MG tablet Take 1,000 mg by mouth 3 (three) times daily as needed for fever or headache (pain).     allopurinol (ZYLOPRIM) 100 MG tablet Take 100 mg by mouth daily as needed (gout attacks).     Biotin 1000 MCG tablet Take 1,000 mcg by mouth daily.     colchicine 0.6 MG tablet Take 1 tablet (0.6 mg total) by mouth 2 (two) times daily. 60 tablet 1   Cranberry 300 MG tablet Take 300 mg by mouth daily.     cyclobenzaprine (FLEXERIL) 5 MG tablet Take 1 tablet (5 mg total) by mouth 3 (three) times daily as needed. 40 tablet 1   Ferrous Sulfate (IRON) 325 (65 Fe) MG TABS Take 325 mg by mouth daily.     Glucosamine-Chondroitin 500-400 MG CAPS Take 1 capsule by mouth daily.     Lysine 500 MG CAPS Take 500 mg by mouth daily.     metoprolol succinate (TOPROL XL) 50 MG 24 hr tablet Take 1 tablet (50 mg total) by mouth daily. Take with or immediately following a meal. 90 tablet 3   Misc Natural Products (TART CHERRY ADVANCED) CAPS Take 1 capsule by mouth daily.     Multiple Vitamin (MULTIVITAMIN WITH MINERALS) TABS tablet Take 1 tablet by mouth daily.  Omega-3 Fatty Acids (FISH OIL) 1000 MG CAPS Take 1,000 mg by mouth daily.     pantoprazole (PROTONIX) 40 MG tablet Take 1 tablet (40 mg total) by mouth daily. 90 tablet 1   pyridOXINE (VITAMIN  B-6) 100 MG tablet Take 100 mg by mouth daily.     QUEtiapine (SEROQUEL) 25 MG tablet Take 12.5 mg by mouth at bedtime as needed (sleep).     Saw Palmetto, Serenoa repens, (SAW PALMETTO EXTRACT) 160 MG CAPS Take 160 mg by mouth daily.     sildenafil (VIAGRA) 100 MG tablet Take 100 mg by mouth daily as needed for erectile dysfunction.     Specialty Vitamins Products (COLLAGEN ULTRA) CAPS Take 1 capsule by mouth daily.     Turmeric 500 MG CAPS Take 500 mg by mouth daily.     vitamin B-12 (CYANOCOBALAMIN) 1000 MCG tablet Take 1,000 mcg by mouth daily.     indomethacin (INDOCIN) 25 MG capsule Take 2 tablets (50 mg) three times daily for 14 days, then take 2 tablets (50 mg) twice daily for 14 days, then take 2 tablets (50 mg) once daily for 14 days, then take 1 tablet (25 mg) once daily for 14 days. 182 capsule 0   No facility-administered medications prior to visit.     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  He was born in [place] with early high school.  He has a BS degree.  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  He has a  child.  Family History:  The patient's family history includes Diabetes in his brother, maternal grandmother, mother, and another family member; Heart disease in his father; High Cholesterol in his maternal grandmother; Hypertension in his father, mother, and another family member; Stroke in his maternal grandfather and maternal grandmother.     ROS General: Negative; No fevers, chills, or night sweats;  HEENT: Negative; No changes in vision or hearing, sinus congestion, difficulty swallowing Pulmonary: Pleuritic chest pain Cardiovascular: See HPI GI: Negative; No nausea, vomiting, diarrhea, or abdominal pain GU: Negative; No dysuria, hematuria, or difficulty voiding Musculoskeletal: Negative; no myalgias, joint pain, or weakness Hematologic/Oncology: Negative; no easy bruising, bleeding Endocrine: Negative; no heat/cold intolerance; no diabetes Neuro: Negative; no changes in balance, headaches Skin: Negative; No rashes or skin lesions Psychiatric: Negative; No behavioral problems, depression Sleep: OSA, noncompliant with CPAP and as result is CPAP machine was picked up by his DME company;  history of snoring, daytime  sleepiness, hypersomnolence Other comprehensive 14 point system review is negative.   PHYSICAL EXAM:   VS:  BP (!) 156/102 (BP Location: Left Arm, Patient Position: Sitting, Cuff Size: Large)   Pulse (!) 53   Ht 6' 4" (1.93 m)   Wt (!) 326 lb (147.9 kg)   BMI 39.68 kg/m     Repeat blood pressure by me was 156/100.  Wt Readings from Last 3 Encounters:  08/25/21 (!) 326 lb (147.9 kg)  08/24/21 (!) 315 lb (142.9 kg)  08/03/21 (!) 315 lb 12.8 oz (143.2 kg)    General: Alert, oriented, no distress.  Mesomorphic Skin: normal turgor, no rashes, warm and dry HEENT: Normocephalic, atraumatic. Pupils equal round and reactive to light; sclera anicteric; extraocular muscles intact; Fundi ** Nose without nasal septal hypertrophy Mouth/Parynx benign; Mallinpatti scale  3 Neck: No JVD, no carotid bruits; normal carotid upstroke Lungs: clear to ausculatation and percussion; no wheezing or rales Chest wall: without tenderness to palpitation Heart: PMI not displaced, RRR, s1 s2 normal, 1/6 systolic murmur, no diastolic murmur, no  friction rub,no gallops, thrills, or heaves Abdomen: soft, nontender; no hepatosplenomehaly, BS+; abdominal aorta nontender and not dilated by palpation. Back: no CVA tenderness Pulses 2+ Musculoskeletal: full range of motion, normal strength, no joint deformities Extremities: no clubbing cyanosis or edema, Homan's sign negative  Neurologic: grossly nonfocal; Cranial nerves grossly wnl Psychologic: Normal mood and affect   Studies/Labs Reviewed:   EKG:  EKG is ordered today. ECG (independently read by me):  Sinus bradycardia at 53, normal PR segment   May 16,2022 ECG (independently read by me): Normal sinus rhythm at 72 bpm.  Previously noted T wave abnormality in lead III and aVF.  January 28, 2021 ECG (independently read by me): NSR at 63; LVH Q III T wave abnormality III, aVF  December 04, 2020 ECG (independently read by me): NSR at 71; mild early repolarization changes; QTc 430  I reviewed his recent ECGs from his emergency room and hospitalizations.  Early repolarization changes had evolved since his October 28, 2020 ECG.  Recent Labs: BMP Latest Ref Rng & Units 08/24/2021 12/04/2020 11/18/2020  Glucose 70 - 99 mg/dL 127(H) 84 84  BUN 6 - 20 mg/dL _0 Creatinine 0.61 - 1.24 mg/dL 1.05 1.09 1.14  BUN/Creat Ratio 9 - 20 - 12 11  Sodium 135 - 145 mmol/L 141 143 140  Potassium 3.5 - 5.1 mmol/L 3.9 3.9 4.4  Chloride 98 - 111 mmol/L 108 105 101  CO2 22 - 32 mmol/L _1 Calcium 8.9 - 10.3 mg/dL 8.8(L) 8.8 9.4     Hepatic Function Latest Ref Rng & Units 08/24/2021 12/04/2020 10/31/2020  Total Protein 6.5 - 8.1 g/dL 6.9 7.1 6.9  Albumin 3.5 - 5.0 g/dL 4.0 4.1 2.6(L)  AST 15 - 41 U/L 17 34 34  ALT 0 - 44 U/L 23 47(H) 39   Alk Phosphatase 38 - 126 U/L 61 81 75  Total Bilirubin 0.3 - 1.2 mg/dL 0.7 0.6 1.1  Bilirubin, Direct 0.0 - 0.2 mg/dL - - 0.4(H)    CBC Latest Ref Rng & Units 08/24/2021 12/04/2020 11/18/2020  WBC 4.0 - 10.5 K/uL 16.8(H) 5.8 6.4  Hemoglobin 13.0 - 17.0 g/dL 13.7 12.9(L) 13.2  Hematocrit 39.0 - 52.0 % 40.7 39.5 39.5  Platelets 150 - 400 K/uL 221 - 263   Lab Results  Component Value Date   MCV 87.0 08/24/2021   MCV 88 12/04/2020   MCV 87  11/18/2020   Lab Results  Component Value Date   TSH 0.603 11/18/2020   No results found for: HGBA1C   BNP    Component Value Date/Time   BNP 63.4 08/24/2021 2030    ProBNP No results found for: PROBNP   Lipid Panel  No results found for: CHOL, TRIG, HDL, CHOLHDL, VLDL, LDLCALC, LDLDIRECT, LABVLDL   RADIOLOGY: DG Chest Portable 1 View  Result Date: 08/24/2021 CLINICAL DATA:  Chest pain and shortness of breath EXAM: PORTABLE CHEST 1 VIEW COMPARISON:  01/15/2021 FINDINGS: Cardiac and mediastinal contours are within normal limits when accounting for differences in technique. No focal pulmonary opacity. No pleural effusion or pneumothorax. No acute osseous abnormality. IMPRESSION: No acute cardiopulmonary process. Electronically Signed   By: Merilyn Baba M.D.   On: 08/24/2021 20:39     Additional studies/ records that were reviewed today include:  I reviewed his prior office notes, January 2022 hospital records, ECGs, and subsequent evaluation with Doreene Adas, PA   ASSESSMENT:    1. Stage II hypertension   2. Pleuritic chest pain   3. OSA (obstructive sleep apnea)   4. Palpitations   5. Viral pericarditis, unspecified chronicity   6. Class 2 obesity due to excess calories without serious comorbidity with body mass index (BMI) of 39.0 to 39.9 in adult     PLAN:  Nathaniel Jones is a 40 year old gentleman who has a history of hypertension, obstructive sleep apnea, Covid 19 infection on October 02, 2020 with subsequent sequelae of  fever, chills, headache, myalgias, and pneumonia.  Spinal tap was negative.  He was felt to have possible pericarditis and was treated with colchicine and ibuprofen and had taken Relafen without much benefit compared to previous ibuprofen.  When I saw him on December 04, 2020 I recommended he resume ibuprofen with his recurrent symptomatology.  A PA and lateral chest x-ray was without acute disease.  I also scheduled him for cardiac MRI which essentially was normal.  Subsequent laboratory also showed significantly improved erythrocyte sedimentation rate and C-reactive protein was normal.  He has developed recurrent episodes of knifelike chest discomfort leading to reevaluation in May.  Recently, in October he again experienced recurrent symptomatology for which she had seen Dr. Davina Poke and subsequently [facility], [facility].  C-reactive protein had elevated to 19 and he was started on indomethacin.  Most recently he was started back on colchicine when seen by Isaac Laud on August 03, 2021.  He has continued to experience recurrent symptomatology.  On exam today he does not have a friction rub.  His pulse was 53.,  He has significant blood pressure elevation, stage II.  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  I have recommended sodium restriction.  I am adding amlodipine 5 mg for improved blood pressure control as well as spironolactone 12.5 mg daily.  I am scheduling him for 2D echo Doppler study for reassessment of LV function, diastolic function, valvular issues and to further assess his pericardium.  He continues to be on indomethacin and colchicine and his pleuritic chest pain has improved but at times still occurs.  I discussed his untreated sleep apnea at length.  He is not a candidate for inspire with his BMI of 39.68.  We discussed the reevaluation versus an attempt at obtaining a customized oral appliance.  After much discussion he would prefer a  trial of a customized oral appliance and I will refer  him for this.  Weeks I am recommending he undergo repeat laboratory with a comprehensive metabolic panel, CBC, ESR and CRP.   Medication Adjustments/Labs and Tests Ordered: Current medicines are reviewed at length with the patient today.  Concerns regarding medicines are outlined above.  Medication changes, Labs and Tests ordered today are listed in the Patient Instructions below. Patient Instructions  Medication Instructions:  Take amlodipine 5 mg each day.  Take spironolactone 12.5 mg (one-half tablet) each day.  *If you need a refill on your cardiac medications before your next appointment, please call your pharmacy*   Lab Work: In two weeks, get blood work (cmet, cbc, sed rate, crp)  If you have labs (blood work) drawn today and your tests are completely normal, you will receive your results only by: [facility] (if you have MyChart) OR A paper copy in the mail If you have any lab test that is abnormal or we need to change your treatment, we will call you to review the results.   Testing/Procedures: Your physician has requested that you have an echocardiogram. Echocardiography is a painless test that uses sound waves to create images of your heart. It provides your doctor with information about the size and shape of your heart and how well your heart's chambers and valves are working. This procedure takes approximately one hour. There are no restrictions for this procedure.   Follow-Up: At [facility], you and your health needs are our priority.  As part of our continuing mission to provide you with exceptional heart care, we have created designated Provider Care Teams.  These Care Teams include your primary Cardiologist (physician) and Advanced Practice Providers (APPs -  Physician Assistants and Nurse Practitioners) who all work together to provide you with the care you need, when you need it.  We recommend signing up  for the patient portal called "MyChart".  Sign up information is provided on this After Visit Summary.  MyChart is used to connect with patients for Virtual Visits (Telemedicine).  Patients are able to view lab/test results, encounter notes, upcoming appointments, etc.  Non-urgent messages can be sent to your provider as well.   To learn more about what you can do with MyChart, go to [email or web address].    Your next appointment:    Keep October 07, 2021 9:40 am appointment.  The format for your next appointment:   In Person  Provider:   Shelva Majestic, MD   Barry Brunner will schedule with for oral appliance fitting with either Dr. Oneal Grout or Dr. Augustina Mood      Signed, Nathaniel Majestic, MD  09/01/2021 6:27 PM    [facility] [address], [place], [place], [place]  [address] Phone: [phone]

## 2021-08-25 NOTE — Patient Instructions (Addendum)
Medication Instructions:  Take amlodipine 5 mg each day.  Take spironolactone 12.5 mg (one-half tablet) each day.  *If you need a refill on your cardiac medications before your next appointment, please call your pharmacy*   Lab Work: In two weeks, get blood work (cmet, cbc, sed rate, crp)  If you have labs (blood work) drawn today and your tests are completely normal, you will receive your results only by: MyChart Message (if you have MyChart) OR A paper copy in the mail If you have any lab test that is abnormal or we need to change your treatment, we will call you to review the results.   Testing/Procedures: Your physician has requested that you have an echocardiogram. Echocardiography is a painless test that uses sound waves to create images of your heart. It provides your doctor with information about the size and shape of your heart and how well your heart's chambers and valves are working. This procedure takes approximately one hour. There are no restrictions for this procedure.   Follow-Up: At [facility], you and your health needs are our priority.  As part of our continuing mission to provide you with exceptional heart care, we have created designated Provider Care Teams.  These Care Teams include your primary Cardiologist (physician) and Advanced Practice Providers (APPs -  Physician Assistants and Nurse Practitioners) who all work together to provide you with the care you need, when you need it.  We recommend signing up for the patient portal called "MyChart".  Sign up information is provided on this After Visit Summary.  MyChart is used to connect with patients for Virtual Visits (Telemedicine).  Patients are able to view lab/test results, encounter notes, upcoming appointments, etc.  Non-urgent messages can be sent to your provider as well.   To learn more about what you can do with MyChart, go to [email or web address].    Your next appointment:    Keep October 07, 2021  9:40 am appointment.  The format for your next appointment:   In Person  Provider:   Nicki Guadalajara, MD   Claiborne Rigg will schedule with for oral appliance fitting with either Dr. Althea Grimmer or Dr. Irene Limbo

## 2021-08-25 NOTE — Telephone Encounter (Signed)
Patient is following up regarding recent hospital admission, 11/22. He is requesting to have Dr. Tresa Endo review his ED notes. He states when he was discharged he was told to seek further advisement from his cardiologist.

## 2021-08-27 ENCOUNTER — Other Ambulatory Visit: Payer: Self-pay | Admitting: Cardiovascular Disease

## 2021-08-28 ENCOUNTER — Telehealth: Payer: Self-pay | Admitting: Cardiology

## 2021-08-28 NOTE — Telephone Encounter (Signed)
Pt called he did not feel well and he has had vomiting earlier today.  He does get hot and cold.  No sick contacts.  Encouraged to take fluids, and to hold indocin for 2 doses.  But if still feels bad to be seen at urgent care possible flu, has not had vaccination.  He has been on most meds for some time, just new dose of torpol XL.  Pt unable to take BP at home.

## 2021-08-31 MED ORDER — IBUPROFEN 200 MG PO CAPS: 600.0000 mg | ORAL_CAPSULE | Freq: Three times a day (TID) | ORAL | 0 refills | Status: DC

## 2021-08-31 NOTE — Telephone Encounter (Signed)
Pt c/o of Chest Pain: STAT if CP now or developed within 24 hours  1. Are you having CP right now? Yes  2. Are you experiencing any other symptoms (ex. SOB, nausea, vomiting, sweating)? SOB   3. How long have you been experiencing CP?  Since October   4. Is your CP continuous or coming and going? Continuous, only dulls doesn't go away   5. Have you taken Nitroglycerin? No    Wants to know if there is any stronger anti inflammatory that can be prescribed to help with CP.  ?

## 2021-08-31 NOTE — Addendum Note (Signed)
Addended by: Tobin Chad on: 08/31/2021 06:26 PM   Modules accepted: Orders

## 2021-08-31 NOTE — Telephone Encounter (Signed)
Patient aware to take ibuprofen 600 mg  3 times a day  ( every 8 hours )  eat something with medication . Patient states he would purchase medication over the counter. Stop taking Indomethacin.  Continue taking colchicine 0.6 mg daily .  Patient voiced understanding  Medication list adjusted.

## 2021-08-31 NOTE — Telephone Encounter (Signed)
Patient calling into office stating he is still having recurring constant chest pain-    It hurts with and  without any movement and ,can be stabbing.   Patient has upcoming Echo  for 09/17/21. Patient would like to see if  echo can be  moved up in time. RN informed patient request has been sent to [facility].    Patient wanted to know if there is anything else he  can take for anti- inflammatory.  Patient is taking  Indomethacin 50 mg  twice daily . Colchicine  0.6 mg daily    Patient aware  will defer to Dr Tresa Endo for further instructions

## 2021-08-31 NOTE — Telephone Encounter (Signed)
Can try ibuprofen 600 mg 3 times a day in place of indomethacin for 2-4 weeks, continue colchicine. May ultimately need steroids

## 2021-09-01 ENCOUNTER — Encounter: Payer: Self-pay | Admitting: Cardiovascular Disease

## 2021-09-02 ENCOUNTER — Other Ambulatory Visit: Payer: Self-pay

## 2021-09-02 ENCOUNTER — Telehealth: Payer: Self-pay | Admitting: *Deleted

## 2021-09-02 ENCOUNTER — Ambulatory Visit (INDEPENDENT_AMBULATORY_CARE_PROVIDER_SITE_OTHER): Payer: 59

## 2021-09-02 DIAGNOSIS — I10 Essential (primary) hypertension: Secondary | ICD-10-CM

## 2021-09-02 DIAGNOSIS — G4733 Obstructive sleep apnea (adult) (pediatric): Secondary | ICD-10-CM | POA: Diagnosis not present

## 2021-09-02 DIAGNOSIS — R002 Palpitations: Secondary | ICD-10-CM | POA: Diagnosis not present

## 2021-09-02 LAB — ECHOCARDIOGRAM COMPLETE
AR max vel: 3.44 cm2
AV Area VTI: 3.78 cm2
AV Area mean vel: 3.45 cm2
AV Mean grad: 3 mmHg
AV Peak grad: 6.3 mmHg
AV Vena cont: 0.29 cm
Ao pk vel: 1.25 m/s
Area-P 1/2: 4.39 cm2
Calc EF: 58.7 %
S' Lateral: 3.85 cm
Single Plane A2C EF: 60.8 %
Single Plane A4C EF: 55.8 %

## 2021-09-02 NOTE — Telephone Encounter (Signed)
Referral for oral appliance sent to Dr Althea Grimmer.

## 2021-09-02 NOTE — Telephone Encounter (Signed)
-----   Message from Gaynelle Cage, [place] sent at 08/25/2021  4:46 PM EST ----- Referral to Dr Myrtis Ser for oral appliance

## 2021-09-08 ENCOUNTER — Telehealth: Payer: Self-pay | Admitting: Cardiovascular Disease

## 2021-09-08 NOTE — Telephone Encounter (Signed)
Patient calling for his Echo results.  

## 2021-09-08 NOTE — Telephone Encounter (Signed)
Gave echo results. Has questions about how he can improve atrial enlargment. Also reports numbness in lips after starting amloidpine and spironolactone. States had palpitations last night. Does not check BP; said he will buy a BP machine. Please advise.

## 2021-09-15 NOTE — Telephone Encounter (Signed)
Spoke to patient he stated he is upset no one has called about recent echo results.Stated he continues to have left sided chest pain.Stated for the past 2 weeks lips are numb.

## 2021-09-15 NOTE — Telephone Encounter (Signed)
Contacted patient, discussed that ECHO results were sent via mychart, seen by patient on 12/06, patient called back into office, spoke to nurse gave results over the phone, then message was sent to [facility] in regards to his echo results and if there was anything that needed to be done. Patient was upset I did discuss with him our protocol here, and advised if he had questions in regards to any message he could message back or call us and I would be glad to assist him. Patient verbalized understanding. Also mentioned that he needed blood work that was ordered at last OV 11/23. Patient was suppose to have blood work completed in 2 weeks, still not collected, patient then stated nobody told him to do blood work, I advised it was in his after visit summary under labs to return. Advised I would have him come in and do these and we would go from there.  Patient will come get blood work. No other questions/concerns at this time.

## 2021-09-15 NOTE — Telephone Encounter (Signed)
Pt is calling back regarding his symptoms from 09/08/21. Please advise pt further [phone]

## 2021-09-17 ENCOUNTER — Other Ambulatory Visit (HOSPITAL_COMMUNITY): Payer: 59

## 2021-09-30 LAB — C-REACTIVE PROTEIN: CRP: 2 mg/L (ref 0–10)

## 2021-09-30 LAB — COMPREHENSIVE METABOLIC PANEL
ALT: 42 IU/L (ref 0–44)
AST: 39 IU/L (ref 0–40)
Albumin/Globulin Ratio: 1.6 (ref 1.2–2.2)
Albumin: 4.2 g/dL (ref 4.0–5.0)
Alkaline Phosphatase: 82 IU/L (ref 44–121)
BUN/Creatinine Ratio: 11 (ref 9–20)
BUN: 12 mg/dL (ref 6–24)
Bilirubin Total: 0.4 mg/dL (ref 0.0–1.2)
CO2: 24 mmol/L (ref 20–29)
Calcium: 9.4 mg/dL (ref 8.7–10.2)
Chloride: 105 mmol/L (ref 96–106)
Creatinine, Ser: 1.08 mg/dL (ref 0.76–1.27)
Globulin, Total: 2.6 g/dL (ref 1.5–4.5)
Glucose: 104 mg/dL — ABNORMAL HIGH (ref 70–99)
Potassium: 4.4 mmol/L (ref 3.5–5.2)
Sodium: 142 mmol/L (ref 134–144)
Total Protein: 6.8 g/dL (ref 6.0–8.5)
eGFR: 88 mL/min/{1.73_m2} (ref >59)

## 2021-09-30 LAB — CBC
Hematocrit: 42.4 % (ref 37.5–51.0)
Hemoglobin: 13.9 g/dL (ref 13.0–17.7)
MCH: 28.7 pg (ref 26.6–33.0)
MCHC: 32.8 g/dL (ref 31.5–35.7)
MCV: 88 fL (ref 79–97)
Platelets: 236 10*3/uL (ref 150–450)
RBC: 4.84 x10E6/uL (ref 4.14–5.80)
RDW: 13 % (ref 11.6–15.4)
WBC: 6.8 10*3/uL (ref 3.4–10.8)

## 2021-09-30 LAB — SEDIMENTATION RATE: Sed Rate: 7 mm/hr (ref 0–15)

## 2021-10-01 ENCOUNTER — Telehealth: Payer: Self-pay | Admitting: Cardiovascular Disease

## 2021-10-01 NOTE — Telephone Encounter (Signed)
Patient is requesting to go over lab results.

## 2021-10-01 NOTE — Telephone Encounter (Signed)
Pt notified TK out of the office for the holiday He will review next week. Pt has appt 1-5 can discuss at that time. Verbalized understanding

## 2021-10-07 ENCOUNTER — Ambulatory Visit: Payer: 59 | Admitting: Cardiovascular Disease

## 2021-10-07 ENCOUNTER — Other Ambulatory Visit: Payer: Self-pay

## 2021-10-07 ENCOUNTER — Encounter: Payer: Self-pay | Admitting: Cardiovascular Disease

## 2021-10-07 VITALS — BP 113/73 | HR 66 | Ht 76.0 in | Wt 320.2 lb

## 2021-10-07 DIAGNOSIS — G4733 Obstructive sleep apnea (adult) (pediatric): Secondary | ICD-10-CM

## 2021-10-07 DIAGNOSIS — B3323 Viral pericarditis: Secondary | ICD-10-CM | POA: Diagnosis not present

## 2021-10-07 DIAGNOSIS — Z6839 Body mass index (BMI) 39.0-39.9, adult: Secondary | ICD-10-CM

## 2021-10-07 DIAGNOSIS — I10 Essential (primary) hypertension: Secondary | ICD-10-CM

## 2021-10-07 DIAGNOSIS — E6609 Other obesity due to excess calories: Secondary | ICD-10-CM

## 2021-10-07 DIAGNOSIS — U071 COVID-19: Secondary | ICD-10-CM

## 2021-10-07 DIAGNOSIS — I301 Infective pericarditis: Secondary | ICD-10-CM

## 2021-10-07 MED ORDER — COLCHICINE 0.6 MG PO TABS: 0.6000 mg | ORAL_TABLET | Freq: Two times a day (BID) | ORAL | 1 refills | Status: DC

## 2021-10-07 NOTE — Progress Notes (Signed)
Cardiology Office Note    Date:  10/07/2021   ID:  Nathaniel Jones, DOB 1981/08/22, MRN [id number]  PCP:  Nathaniel Jones.Nathaniel Sa, MD  Cardiologist:  Nathaniel Majestic, MD   6 weekF/U  History of Present Illness:  Nathaniel Jones is a 41 y.o. male who I had seen in the past for evaluation of chest pain with atypical features as well as sleepiness.  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  Since his playing days when his weight was 255 pounds he has gained weight and has been around 315.  He has a history of hypertension.  In 2018 he had a normal nuclear stress test and an echo Doppler study showed an EF of 55% with moderate LVH with mild left atrial enlargement and mild TR.  He has obstructive sleep apnea and has had issues with significant excessive daytime sleepiness.  Remotely had been evaluated by Dr. Rexene Jones and was reevaluated by me on a home study in October 2021 which showed mild overall sleep apnea with an AHI of 10 but in the past he had been noted to have severe OSA during REM sleep which was unable to be assessed on the home study.  He never had a CPAP titration or AutoPap initiation.  He developed COVID-19 infection on October 02, 2020.  He presented to the emergency room on October 27, 2020 with headaches, chills myalgias and fever.  He was sent home.  He returned on January 27 with neck pain and significant headache and apparently had a spinal tap which was negative.  A chest x-ray was suspicious for possible pneumonia and he was discharged on Augmentin.  He represented the next day with left precordial chest pain and low-grade fever as well as mild cough.  He was admitted to the hospital on October 31, 2020.  CT of the chest was negative for PE and was felt he had pericarditis and possible consolidation of his right middle upper lobe.  He continued to have low fever.  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  Blood  cultures were negative.  Procalcitonin was greater than 4, CRP was 40.  Repeat Covid 19 PCR test was negative.  An echo Doppler study showed an EF of 60 to 65% with grade 1 diastolic dysfunction there was no mention of any pericardial effusion.  He was treated with Rocephin and azithromycin and was recommended to take colchicine and continue allopurinol with his history of gout.  The patient was to have a repeat chest x-ray after completion of antibiotic therapy.  He was evaluated by Nathaniel Jones on November 18, 2020 and admitted to feeling terrible.  He had finished his antibiotic therapy for pneumonia and sinusitis and admitted to being breathless after 1 flight of steps and even taking a shower left knee is significantly fatigued.  He has not yet returned to work.  Due to diagnosis of pericarditis it was recommended he complete 3 months of colchicine therapy she started him on Relafen instead of ibuprofen but he did not notice any significant change and she had a Protonix 40 mg daily.    I saw him on December 04, 2020 at which time he continued to experience increasing shortness of breath with activity and admitted to being very weak.  He was experiencing episodes of Jones stabbing pain when he was lying down on the left chest region.  He no longer had a fever.  He had not had a follow-up chest x-ray.  He was not sleeping well.  I recommended he undergo a PA and lateral chest x-ray as well as laboratory.  CRP was 1.  Hemoglobin and hematocrit were 12.9 and 39.5.  Creatinine was 1.09.  Erythrocyte sedimentation rate had improved and was now 18 compared to 98 in January and 40 on November 18, 2020.  A chest x-ray January 15, 2021 was negative for acute cardiopulmonary disease.  I scheduled him for cardiac MRI which was done on January 18, 2021 was essentially normal and showed normal LV chamber size with mild increased wall thickness of 12 mm.  LVEF was 60% without regional wall motion abnormalities.  There was no  evidence for myocardial edema.  There was no postcontrast delayed myocardial enhancement.  RV function was normal with an EF of 53%.  Valves were normal.  The pericardium was normal.  His aorta was mildly dilated at the level of the sinus Valsalva measuring 41 mm.  I saw him on January 18, 2021 in follow-up office visit.  At that time he felt better but had not yet returned to work.  However he was still experiencing some pain while at sleeping on his back and reason was sleeping on his left side.  He described the pain as a knifelike sensation is also noted a vague residual dullness.  During that evaluation, I recommended that he can return to work.  I felt his chest pain was nonischemic and chest wall discomfort.  He was still waiting for his machine and I recommended institution of AutoPap therapy.  I saw him as an add-on on Feb 15, 2021.  Since his previous evaluation he had  called the office on January 28, 2021 after he experienced recurrent Jones chest pain episodes after he had had a heated discussion with his supervisor at work.  He had noted that his heart rate increases when he lies on his back even before he falls asleep with heart rates in the upper 90s.  He has tried instituting AutoPap therapy.  However he is only been sleeping several hours with it.  He has been having difficulty with the full facemask that was provided to him which covers the nose and mouth.  He was added onto my schedule for follow-up evaluation.  During that evaluation, his ECG showed sinus rhythm at 72 bpm with previously noted T wave abnormality in leads III and aVF.  He was experiencing knifelike chest pain which seem to be precipitated by [place] and was taking ibuprofen 600 mg twice a day with benefit.  With his recent heart rate increase I suggested a trial of increasing metoprolol succinate from 25 to 50 mg.  I had a long discussion with him regarding his initiation of CPAP therapy.  Apparently, Mr. Nathaniel Jones CPAP machine was  picked up by the DME company on August 2 due to very poor compliance and no recent usage.  He was seen by Dr. Grayling Congress on July 30, 2021 complaining of 2 to 3 weeks of intermittent chest tightness more described as a Jones pain on the right which then moved to his left chest.  It also had pleuritic component being worse with deep inspiration.  The time he was very concerned about recurrent pericarditis.  Dr. Davina Poke was not convinced this was pericarditis and that may be just pleurisy related to a chest cold.  However sed rate and CRP was obtained and he was started on therapy with indomethacin 25  mg 3 times a day for 1 week.  Sedimentation rate was normal at 8 and C-reactive protein was increased at 19.  Subsequently, he was reevaluated on August 03, 2021 Almyra Deforest, [place] after he had called the office complaining of worsening symptoms.  He was complaining of pleuritic chest pain with Jones pain with deep inspiration and laying down which was the same pain that he had experienced with pericarditis.  At that time, he was represcribed colchicine 0.6 mg twice a day and he was wondering about getting an antibiotic but it was felt that most likely he had a nonbacterial etiology and antibiotic was not prescribed.  I saw him as an add-on in August 25, 2021.  He had been  evaluated in urgent care on November 16 due to concerns about worsening chest pain and shortness of breath.  A chest x-ray was obtained and he was started on Levaquin and prednisone for presumed walking pneumonia.  August 24, 2021 he read presented to [facility] ER at [place] with shortness of breath that was waking him up from sleep at night and similar pleuritic-like chest pain.  D-dimer was negative.  White blood count was 16,800, but he was on prednisone.  Glucose had increased to 127.  COVID test was negative.  Troponins were negative.  BNP was normal at 63.4.  His blood pressure has been elevated.  He continues to experience some twinges of  chest pain when he takes a deep breath but actually the symptoms have somewhat improved since initiation of colchicine and he has continued to be on indomethacin 50 mg twice a day.  He was sleeping poorly.   During that evaluation his ECG showed sinus bradycardia 53 bpm.  He had normal PR segment.  On exam he did not have a friction rub.  His blood pressure was elevated and I felt this may have been also contributed by his rem related untreated sleep apnea.  I recommended sodium restriction and added amlodipine 5 mg for improved blood pressure control as well as spironolactone 12.5 mg daily.  I scheduled him for 2D echo Doppler study.  I recommended he continue nonsteroidal anti-inflammatory medicine and colchicine.  I discussed his untreated sleep apnea at length.  He was not a candidate for inspire with his BMI of 39.68.  We discussed reevaluation versus a dental evaluation for a customized oral appliance.  I also recommended repeat laboratory with a comprehensive metabolic panel, CBC, erythrocyte sedimentation rate, and CRP.  Since I saw him, he has felt fatigued.  His echo Doppler study on September 02, 2021 showed an EF of 55 to 60% with normal wall motion.  There was mild biatrial enlargement.  Pericardium was normal without evidence for effusion.  Laboratory revealed a CRP of 2, normal chemistry and sed rate of 7.  CBC was normal with white blood count of 16.8 which had improved from November 22 when it was 16.8.  He feels better.  He presents for evaluation.  Past Medical History:  Diagnosis Date   Hypertension    Low back pain    herniated disc   Obesity    OSA (obstructive sleep apnea)    has not yet received CPAP   Pericarditis    in his 44s    Past Surgical History:  Procedure Laterality Date   KNEE SURGERY Right     Current Medications: Outpatient Medications Prior to Visit  Medication Sig Dispense Refill   acetaminophen (TYLENOL) 500 MG tablet Take 1,000 mg  by mouth 3 (three)  times daily as needed for fever or headache (pain).     allopurinol (ZYLOPRIM) 100 MG tablet Take 100 mg by mouth daily as needed (gout attacks).     amLODipine (NORVASC) 5 MG tablet Take 1 tablet (5 mg total) by mouth daily. 90 tablet 3   Biotin 1000 MCG tablet Take 1,000 mcg by mouth daily.     Cranberry 300 MG tablet Take 300 mg by mouth daily.     cyclobenzaprine (FLEXERIL) 5 MG tablet Take 1 tablet (5 mg total) by mouth 3 (three) times daily as needed. 40 tablet 1   Ferrous Sulfate (IRON) 325 (65 Fe) MG TABS Take 325 mg by mouth daily.     Glucosamine-Chondroitin 500-400 MG CAPS Take 1 capsule by mouth daily.     Ibuprofen 200 MG CAPS Take 3 capsules (600 mg total) by mouth every 8 (eight) hours. For 2 - 4 weeks , Eat something when taking medication 120 capsule 0   Lysine 500 MG CAPS Take 500 mg by mouth daily.     metoprolol succinate (TOPROL XL) 50 MG 24 hr tablet Take 1 tablet (50 mg total) by mouth daily. Take with or immediately following a meal. 90 tablet 3   Misc Natural Products (TART CHERRY ADVANCED) CAPS Take 1 capsule by mouth daily.     Multiple Vitamin (MULTIVITAMIN WITH MINERALS) TABS tablet Take 1 tablet by mouth daily.     Omega-3 Fatty Acids (FISH OIL) 1000 MG CAPS Take 1,000 mg by mouth daily.     pantoprazole (PROTONIX) 40 MG tablet Take 1 tablet (40 mg total) by mouth daily. 90 tablet 1   pyridOXINE (VITAMIN B-6) 100 MG tablet Take 100 mg by mouth daily.     QUEtiapine (SEROQUEL) 25 MG tablet Take 12.5 mg by mouth at bedtime as needed (sleep).     Saw Palmetto, Serenoa repens, (SAW PALMETTO EXTRACT) 160 MG CAPS Take 160 mg by mouth daily.     sildenafil (VIAGRA) 100 MG tablet Take 100 mg by mouth daily as needed for erectile dysfunction.     Specialty Vitamins Products (COLLAGEN ULTRA) CAPS Take 1 capsule by mouth daily.     spironolactone (ALDACTONE) 25 MG tablet Take 0.5 tablets (12.5 mg total) by mouth daily. 45 tablet 3   Turmeric 500 MG CAPS Take 500 mg by mouth  daily.     vitamin B-12 (CYANOCOBALAMIN) 1000 MCG tablet Take 1,000 mcg by mouth daily.     colchicine 0.6 MG tablet Take 1 tablet (0.6 mg total) by mouth 2 (two) times daily. 60 tablet 1   No facility-administered medications prior to visit.     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  He was born in [place] with early high school.  He has a BS degree.  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  He has a child.  Family History:  The patient's family history includes Diabetes in his brother, maternal grandmother, mother, and another family member; Heart disease in his father; High Cholesterol in his maternal grandmother; Hypertension in his father, mother, and another family member; Stroke in his maternal grandfather and maternal grandmother.     ROS General: Negative; No fevers, chills, or night sweats;  HEENT: Negative; No changes in vision or hearing, sinus congestion, difficulty swallowing Pulmonary: Significant improvement in prior pleuritic chest pain Cardiovascular: See HPI GI: Negative; No nausea, vomiting,  diarrhea, or abdominal pain GU: Negative; No dysuria, hematuria, or difficulty voiding Musculoskeletal: Negative; no myalgias, joint pain, or weakness Hematologic/Oncology: Negative; no easy bruising, bleeding Endocrine: Negative; no heat/cold intolerance; no diabetes Neuro: Negative; no changes in balance, headaches Skin: Negative; No rashes or skin lesions Psychiatric: Negative; No behavioral problems, depression Sleep: OSA, noncompliant with CPAP and as result is CPAP machine was picked up by his DME company;  history of snoring, daytime sleepiness, hypersomnolence Other comprehensive 14 point system review is negative.   PHYSICAL EXAM:   VS:  BP 113/73    Pulse 66    Ht _0  (1.93 m)    Wt (!) 320 lb 3.2 oz (145.2 kg)    SpO2 98%    BMI 38.98 kg/m     Repeat blood pressure by me was 110/73.  Wt Readings from Last 3 Encounters:  10/07/21 (!) 320 lb 3.2 oz (145.2 kg)  08/25/21 (!) 326 lb (147.9 kg)  08/24/21 (!) 315 lb (142.9 kg)    General: Alert, oriented, no distress.  Mesomorphic muscular Skin: normal turgor, no rashes, warm and dry HEENT: Normocephalic, atraumatic. Pupils equal round and reactive to light; sclera anicteric; extraocular muscles intact;  Nose without nasal septal hypertrophy Mouth/Parynx benign; Mallinpatti scale 3 Neck: No JVD, no carotid bruits; normal carotid upstroke Lungs: clear to ausculatation and percussion; no wheezing or rales Chest wall: without tenderness to palpitation Heart: PMI not displaced, RRR, s1 s2 normal, 1/6 systolic murmur, no diastolic murmur, no friction rub no  gallops, thrills, or heaves Abdomen: soft, nontender; no hepatosplenomehaly, BS+; abdominal aorta nontender and not dilated by palpation. Back: no CVA tenderness Pulses 2+ Musculoskeletal: full range of motion, normal strength, no joint deformities Extremities: no clubbing cyanosis or edema, Homan's sign negative  Neurologic: grossly nonfocal; Cranial nerves grossly  wnl Psychologic: Normal mood and affect     Studies/Labs Reviewed:   October 07, 2021 ECG (independently read by me): NSR at 66; PR 182 msec; QTc 429 msec  August 25, 2021 ECG (independently read by me):  Sinus bradycardia at 53, normal PR segment   May 16,2022 ECG (independently read by me): Normal sinus rhythm at 72 bpm.  Previously noted T wave abnormality in lead III and aVF.  January 28, 2021 ECG (independently read by me): NSR at 63; LVH Q III T wave abnormality III, aVF  December 04, 2020 ECG (independently read by me): NSR at 71; mild early repolarization changes; QTc 430  I reviewed his recent ECGs from his emergency room and hospitalizations.  Early repolarization changes had evolved since his October 28, 2020 ECG.  Recent Labs: BMP Latest Ref Rng & Units 09/29/2021 08/24/2021 12/04/2020  Glucose 70 - 99 mg/dL 104(H) 127(H) 84  BUN 6 - 24 mg/dL _0 Creatinine 0.76 - 1.27 mg/dL 1.08 1.05 1.09  BUN/Creat Ratio 9 - 20 11 - 12  Sodium 134 - 144 mmol/L 142 141 143  Potassium 3.5 - 5.2 mmol/L 4.4 3.9 3.9  Chloride 96 - 106 mmol/L 105 108 105  CO2 20 - 29 mmol/L _1 Calcium 8.7 - 10.2 mg/dL 9.4 8.8(L) 8.8     Hepatic Function Latest Ref Rng & Units 09/29/2021 08/24/2021 12/04/2020  Total Protein 6.0 - 8.5 g/dL 6.8 6.9 7.1  Albumin 4.0 - 5.0 g/dL 4.2 4.0 4.1  AST 0 - 40 IU/L 39 17 34  ALT 0 - 44 IU/L 42 23 47(H)  Alk Phosphatase 44 - 121 IU/L 82 61 81  Total Bilirubin 0.0 - 1.2 mg/dL 0.4 0.7 0.6  Bilirubin, Direct 0.0 - 0.2 mg/dL - - -    CBC Latest Ref Rng & Units 09/29/2021 08/24/2021 12/04/2020  WBC 3.4 - 10.8 x10E3/uL 6.8 16.8(H) 5.8  Hemoglobin 13.0 - 17.7 g/dL 13.9 13.7 12.9(L)  Hematocrit 37.5 - 51.0 % 42.4 40.7 39.5  Platelets 150 - 450 x10E3/uL 236 221 -   Lab Results  Component Value Date   MCV 88 09/29/2021   MCV 87.0 08/24/2021   MCV 88 12/04/2020   Lab Results  Component Value Date   TSH 0.603 11/18/2020   No results found for:  HGBA1C   BNP    Component Value Date/Time   BNP 63.4 08/24/2021 2030    ProBNP No results found for: PROBNP   Lipid Panel  No results found for: CHOL, TRIG, HDL, CHOLHDL, VLDL, LDLCALC, LDLDIRECT, LABVLDL   RADIOLOGY: No results found.   Additional studies/ records that were reviewed today include:  I reviewed his prior office notes, January 2022 hospital records, ECGs, and subsequent evaluation with Doreene Adas, PA   ASSESSMENT:    No diagnosis found.   PLAN:  Mr. OMER PUCCINELLI is a 41 year old gentleman who has a history of hypertension, obstructive sleep apnea, Covid 19 infection on October 02, 2020 with subsequent sequelae of fever, chills, headache, myalgias, and pneumonia.  Spinal tap was negative.  He was felt to have possible pericarditis and was treated with colchicine and ibuprofen and had taken Relafen without much benefit compared to previous ibuprofen.  When I saw him on December 04, 2020 I recommended he resume ibuprofen with his recurrent symptomatology.  A PA and lateral chest x-ray was without acute disease.  I also scheduled him for cardiac MRI which essentially was normal.  Subsequent laboratory also showed significantly improved erythrocyte sedimentation rate and C-reactive protein was normal.  He has developed recurrent episodes of knifelike chest discomfort leading to reevaluation in May.  In October 2022 he again experienced recurrent symptomatology for which she had seen Dr. Davina Poke and subsequently Almyra Deforest, PA.  C-reactive protein had elevated to 19 and he was started on indomethacin.  Most recently he was started back on colchicine when seen by Almyra Deforest, PA on August 03, 2021.  He has continued to experience recurrent symptomatology.  When I saw him in late November 2022, he had stage II hypertension and I started him on amlodipine 5 mg in addition to spironolactone.  A subsequent 2D echo Doppler study showed normal systolic function.  Pericardium was normal.   There was mild atrial dilatation.  He has been on ibuprofen most recently at 600 mg every 8 hours in addition to colchicine 0.6 mg twice daily as well as amlodipine 5 mg, metoprolol succinate 50 mg daily.  With this therapy, his inflammation has essentially resolved.  His CRP is 2 and sedimentation rate 7.  His blood pressure today is now stable on his increased medical regimen.  I have now recommended he slightly reduce ibuprofen and take 40 mg every 8 hours for 2 days and then reduce this to 200 mg every 8 hours with subsequent as needed.  He will continue colchicine for another 3 months.  He will undergo dental evaluation for customized oral appliance and apparently has an appointment to see Dr. Ron Parker.  I will see him in 3 to 4 months for follow-up evaluation or sooner as needed.  Medication Adjustments/Labs and Tests Ordered: Current medicines are reviewed at length with the patient today.  Concerns regarding medicines are outlined above.  Medication changes, Labs and Tests ordered today are listed in the Patient Instructions below. There are no Patient Instructions on file for this visit.   Signed, Nathaniel Majestic, MD  10/07/2021 10:29 AM    [facility] [address], [place], [place], [place]  [address] Phone: [phone]

## 2021-10-07 NOTE — Patient Instructions (Signed)
Medication Instructions:  WEAN OFF IBUPROFEN AS DISCUSSED *If you need a refill on your cardiac medications before your next appointment, please call your pharmacy*  Lab Work: NONE  Testing/Procedures: NONE  Follow-Up: Your next appointment:  3-4 month(s) In Person with Nicki Guadalajara, MD :1  At [facility], you and your health needs are our priority.  As part of our continuing mission to provide you with exceptional heart care, we have created designated Provider Care Teams.  These Care Teams include your primary Cardiologist (physician) and Advanced Practice Providers (APPs -  Physician Assistants and Nurse Practitioners) who all work together to provide you with the care you need, when you need it.

## 2021-10-15 ENCOUNTER — Encounter: Payer: Self-pay | Admitting: Cardiovascular Disease

## 2021-10-29 ENCOUNTER — Telehealth: Payer: Self-pay | Admitting: Cardiovascular Disease

## 2021-10-29 DIAGNOSIS — I319 Disease of pericardium, unspecified: Secondary | ICD-10-CM

## 2021-10-29 MED ORDER — PANTOPRAZOLE SODIUM 40 MG PO TBEC: 40.0000 mg | DELAYED_RELEASE_TABLET | Freq: Every day | ORAL | 1 refills | Status: DC

## 2021-10-29 NOTE — Telephone Encounter (Signed)
° ° °*  STAT* If patient is at the pharmacy, call can be transferred to refill team.   1. Which medications need to be refilled? (please list name of each medication and dose if known) pantoprazole (PROTONIX) 40 MG tablet  2. Which pharmacy/location (including street and city if local pharmacy) is medication to be sent to? [id number] - [place], [place] - [address]  3. Do they need a 30 day or 90 day supply? 90 days

## 2021-10-29 NOTE — Telephone Encounter (Signed)
Refill sent to the pharmacy 

## 2021-11-01 ENCOUNTER — Ambulatory Visit: Payer: 59 | Admitting: Physician Assistant

## 2021-11-08 ENCOUNTER — Other Ambulatory Visit: Payer: Self-pay

## 2021-11-08 ENCOUNTER — Ambulatory Visit (INDEPENDENT_AMBULATORY_CARE_PROVIDER_SITE_OTHER): Payer: 59 | Admitting: Physician Assistant

## 2021-11-08 ENCOUNTER — Encounter: Payer: Self-pay | Admitting: Physician Assistant

## 2021-11-08 VITALS — Ht 76.0 in | Wt 327.0 lb

## 2021-11-08 DIAGNOSIS — M1711 Unilateral primary osteoarthritis, right knee: Secondary | ICD-10-CM | POA: Diagnosis not present

## 2021-11-08 DIAGNOSIS — G5602 Carpal tunnel syndrome, left upper limb: Secondary | ICD-10-CM

## 2021-11-08 DIAGNOSIS — G8929 Other chronic pain: Secondary | ICD-10-CM

## 2021-11-08 DIAGNOSIS — M5442 Lumbago with sciatica, left side: Secondary | ICD-10-CM

## 2021-11-08 DIAGNOSIS — M1712 Unilateral primary osteoarthritis, left knee: Secondary | ICD-10-CM

## 2021-11-08 DIAGNOSIS — G5601 Carpal tunnel syndrome, right upper limb: Secondary | ICD-10-CM

## 2021-11-08 MED ORDER — LIDOCAINE HCL 1 % IJ SOLN
3.0000 mL | INTRAMUSCULAR | Status: AC | PRN
  Administered 2021-11-08: 3 mL

## 2021-11-08 MED ORDER — METHYLPREDNISOLONE ACETATE 40 MG/ML IJ SUSP
40.0000 mg | INTRAMUSCULAR | Status: AC | PRN
  Administered 2021-11-08: 40 mg via INTRA_ARTICULAR

## 2021-11-08 NOTE — Progress Notes (Signed)
Office Visit Note   Patient: Nathaniel Jones           Date of Birth: [birth year]           MRN: [id number] Visit Date: 11/08/2021              Requested by: Nathaniel Jones, L.Nathaniel Jones, [employer] [address] [place],  [place] [address] PCP: Nathaniel Jones, L.Nathaniel Sa, MD   Assessment & Plan: Visit Diagnoses:  1. Carpal tunnel syndrome, left upper limb   2. Carpal tunnel syndrome, right upper limb   3. Chronic left-sided low back pain with left-sided sciatica   4. Unilateral primary osteoarthritis, right knee   5. Unilateral primary osteoarthritis, left knee     Plan: Given the fact that he has low back pain with similar symptoms into the left leg as he was having last year recommend that he undergo the epidural steroid injection of his lumbar spine which would be an L5 transforaminal injection.  In regards to his hands recommend that he use night splints due to the fact that at this point time his not having his many symptoms.  However if he has worsening numbness tingling in the hands would recommend carpal tunnel release he will let us know in regards to this.  In regards to his knees given his pericarditis and prediabetes recommend no knee injection 1 knee today.  Therefore we will have him follow-up with [place] in about 2 weeks see what type of response he had to the left knee injection today and consider possible right knee injection.  Questions were encouraged and answered at length.  Follow-Up Instructions: Return in about 2 weeks (around 11/22/2021).   Orders:  No orders of the defined types were placed in this encounter.  No orders of the defined types were placed in this encounter.     Procedures: Large Joint Inj: L knee on 11/08/2021 5:01 PM Indications: pain Details: 22 G 1.5 in needle, anterolateral approach  Arthrogram: No  Medications: 3 mL lidocaine 1 %; 40 mg methylPREDNISolone acetate 40 MG/ML Outcome: tolerated well, no immediate complications Procedure, treatment alternatives,  risks and benefits explained, specific risks discussed. Consent was given by the patient. Immediately prior to procedure a time out was called to verify the correct patient, procedure, equipment, support staff and site/side marked as required. Patient was prepped and draped in the usual sterile fashion.      Clinical Data: No additional findings.   Subjective: Chief Complaint  Patient presents with   Right Knee - Pain   Left Knee - Pain    HPI Nathaniel Jones returns today for multiple complaints.  He is having bilateral knee pain.,  Low back pain with radicular symptoms left leg and numbness tingling both hands.  Unfortunately since we seen him he has had COVID actually developed pericarditis and did not follow-up with Dr. Ernestina Jones for the left L5 transforaminal injection.  He also did not follow-up for his hand numbness and did not get the EMG nerve conduction results.  States currently that his hands really did not awaken him.  Occasionally when driving he has numbness in his left hand.  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  In regards to his back he states the pain is very similar to what he has had in the past the only difference is he has "shockwaves" 1-2 times per day.  Is numbness tingling down the left  leg from the hip area to the knee.  No new radicular symptoms down the right side.  Previous MRI in May 2022 showed degenerative disc disease at L3-4 L4-5 and L4-5 there was mild central canal stenosis and asymmetric left subarticular recess stenosis with potential impingement on the left L5 nerve root.  Again he was scheduled for an epidural steroid injection with Dr. Ernestina Jones but contracted COVID and had multiple medical problems after that.  Regards to his knees he has known osteoarthritis of both knees.  He has had no new injury to either knee.  Currently left knee is more bothersome than the right.   He notes no mechanical symptoms of either knee.  No significant swelling.  He does feel that the right knee could give way on him.   Review of Systems See HPI. Denies fevers or chills. Objective: Vital Signs: Ht 6\' 4"  (1.93 m)    Wt (!) 327 lb (148.3 kg)    BMI 39.80 kg/m   Physical Exam General well-developed well-nourished male no acute distress. Ortho Exam Bilateral hands: Subjective sensation is intact throughout both hands.  Negative Tinel's over the median nerve at the wrist bilaterally.  Phalen's positive on the left. Bilateral knees full range of motion of both knees.  Patellofemoral crepitus bilaterally left greater than right.  No instability valgus varus stressing.  No abnormal warmth erythema or effusion of either knee.  Slight tenderness over the medial joint line of the right knee. Specialty Comments:  No specialty comments available.  Imaging: No results found.   PMFS History: Patient Active Problem List   Diagnosis Date Noted   CAP (community acquired pneumonia) 10/31/2020   Pericarditis, viral 10/31/2020   Essential hypertension 10/31/2020   OSA (obstructive sleep apnea) 10/31/2020   Sleep disturbance 10/15/2020   Rectal bleeding 10/15/2020   Primary generalized (osteo)arthritis 10/15/2020   Prediabetes 10/15/2020   Obesity 10/15/2020   Neck pain 10/15/2020   Muscle contraction headache 10/15/2020   Decreased testosterone level 10/15/2020   Suspected COVID-19 virus infection 12/24/2019   Unilateral primary osteoarthritis, left knee 05/17/2018   Unilateral primary osteoarthritis, right knee 05/17/2018   Chronic pain of both knees 05/17/2018   Biceps rupture, proximal, right, initial encounter 05/22/2017   Chronic bilateral low back pain without sciatica 03/08/2017   Lateral epicondylitis, right elbow 03/08/2017   It band syndrome, left 03/08/2017   Past Medical History:  Diagnosis Date   Hypertension    Low back pain    herniated disc   Obesity     OSA (obstructive sleep apnea)    has not yet received CPAP   Pericarditis    in his 77s    Family History  Problem Relation Age of Onset   Hypertension Other    Diabetes Other    Hypertension Mother    Diabetes Mother    Hypertension Father    Heart disease Father        Unclear details   Diabetes Brother    Stroke Maternal Grandmother    Diabetes Maternal Grandmother    High Cholesterol Maternal Grandmother    Stroke Maternal Grandfather    Autoimmune disease Neg Hx     Past Surgical History:  Procedure Laterality Date   KNEE SURGERY Right    Social History   Occupational History   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

## 2021-11-09 NOTE — Addendum Note (Signed)
Addended by: Barbette Or on: 11/09/2021 08:41 AM   Modules accepted: Orders

## 2021-11-15 DIAGNOSIS — Z0289 Encounter for other administrative examinations: Secondary | ICD-10-CM

## 2021-11-22 ENCOUNTER — Encounter: Payer: Self-pay | Admitting: Physician Assistant

## 2021-11-22 ENCOUNTER — Ambulatory Visit: Payer: 59 | Admitting: Physician Assistant

## 2021-11-22 ENCOUNTER — Other Ambulatory Visit: Payer: Self-pay

## 2021-11-22 DIAGNOSIS — M1711 Unilateral primary osteoarthritis, right knee: Secondary | ICD-10-CM | POA: Diagnosis not present

## 2021-11-22 MED ORDER — LIDOCAINE HCL 1 % IJ SOLN
3.0000 mL | INTRAMUSCULAR | Status: AC | PRN
  Administered 2021-11-22: 3 mL

## 2021-11-22 MED ORDER — METHYLPREDNISOLONE ACETATE 40 MG/ML IJ SUSP
40.0000 mg | INTRAMUSCULAR | Status: AC | PRN
  Administered 2021-11-22: 40 mg via INTRA_ARTICULAR

## 2021-11-22 NOTE — Progress Notes (Signed)
° °  Procedure Note  Patient: Nathaniel Jones             Date of Birth: [birth year]           MRN: [id number]             Visit Date: 11/22/2021 HPI: Returns today for follow-up left knee.  He was seen on 11/08/2021 and given cortisone injection left knee he states this helped a whole lot.  He still has some pain in the back of the left knee though.  He had no adverse effects to the injection.  He is asking for right knee injection.  He has known osteoarthritis both knees.  Bilateral knees: Good range of motion both knees with significant patellofemoral crepitus.  Tenderness along medial joint line of both knees.  No effusion abnormal warmth erythema or edema either knee.  Procedures: Visit Diagnoses:  1. Unilateral primary osteoarthritis, right knee     Large Joint Inj on 11/22/2021 10:40 AM Indications: pain Details: 22 G 1.5 in needle, anterolateral approach  Arthrogram: No  Medications: 3 mL lidocaine 1 %; 40 mg methylPREDNISolone acetate 40 MG/ML Outcome: tolerated well, no immediate complications Procedure, treatment alternatives, risks and benefits explained, specific risks discussed. Consent was given by the patient. Immediately prior to procedure a time out was called to verify the correct patient, procedure, equipment, support staff and site/side marked as required. Patient was prepped and draped in the usual sterile fashion.    Plan: He will work on Forensic scientist.  He is set up for epidural steroid injection with Dr. Ernestina Patches next Monday.  We will see how he does overall with these injections.  He will follow-up with [place] as needed pain persist or becomes worse.  Continue to use the wrist splint for his carpal tunnel syndrome.

## 2021-11-24 ENCOUNTER — Ambulatory Visit (INDEPENDENT_AMBULATORY_CARE_PROVIDER_SITE_OTHER): Payer: 59 | Admitting: Family Medicine

## 2021-11-29 ENCOUNTER — Ambulatory Visit: Payer: Self-pay

## 2021-11-29 ENCOUNTER — Encounter: Payer: Self-pay | Admitting: Physical Medicine and Rehabilitation

## 2021-11-29 ENCOUNTER — Ambulatory Visit (INDEPENDENT_AMBULATORY_CARE_PROVIDER_SITE_OTHER): Payer: 59 | Admitting: Physical Medicine and Rehabilitation

## 2021-11-29 ENCOUNTER — Other Ambulatory Visit: Payer: Self-pay

## 2021-11-29 VITALS — BP 159/96 | HR 74

## 2021-11-29 DIAGNOSIS — M5416 Radiculopathy, lumbar region: Secondary | ICD-10-CM

## 2021-11-29 MED ORDER — METHYLPREDNISOLONE ACETATE 80 MG/ML IJ SUSP
80.0000 mg | Freq: Once | INTRAMUSCULAR | Status: AC
  Administered 2021-11-29: 80 mg

## 2021-11-29 NOTE — Patient Instructions (Signed)

## 2021-11-29 NOTE — Progress Notes (Signed)
Pt state lower back pain. Pt state laying down and standing makes the pain worse. Pt state he takes pain meds and uses heat to help ease his pain.  Numeric Pain Rating Scale and Functional Assessment Average Pain 6   In the last MONTH (on 0-10 scale) has pain interfered with the following?  1. General activity like being  able to carry out your everyday physical activities such as walking, climbing stairs, carrying groceries, or moving a chair?  Rating(10)   +Driver, -BT, -Dye Allergies.

## 2021-12-07 ENCOUNTER — Ambulatory Visit (INDEPENDENT_AMBULATORY_CARE_PROVIDER_SITE_OTHER): Payer: 59 | Admitting: Family Medicine

## 2021-12-07 ENCOUNTER — Encounter (INDEPENDENT_AMBULATORY_CARE_PROVIDER_SITE_OTHER): Payer: Self-pay | Admitting: Family Medicine

## 2021-12-07 ENCOUNTER — Other Ambulatory Visit: Payer: Self-pay

## 2021-12-07 VITALS — BP 122/76 | HR 64 | Temp 97.9°F | Ht 75.0 in | Wt 313.0 lb

## 2021-12-07 DIAGNOSIS — M25561 Pain in right knee: Secondary | ICD-10-CM

## 2021-12-07 DIAGNOSIS — M25562 Pain in left knee: Secondary | ICD-10-CM

## 2021-12-07 DIAGNOSIS — R0602 Shortness of breath: Secondary | ICD-10-CM

## 2021-12-07 DIAGNOSIS — Z1331 Encounter for screening for depression: Secondary | ICD-10-CM

## 2021-12-07 DIAGNOSIS — Z9189 Other specified personal risk factors, not elsewhere classified: Secondary | ICD-10-CM

## 2021-12-07 DIAGNOSIS — E669 Obesity, unspecified: Secondary | ICD-10-CM | POA: Diagnosis not present

## 2021-12-07 DIAGNOSIS — Z6839 Body mass index (BMI) 39.0-39.9, adult: Secondary | ICD-10-CM

## 2021-12-07 DIAGNOSIS — R7303 Prediabetes: Secondary | ICD-10-CM | POA: Diagnosis not present

## 2021-12-07 DIAGNOSIS — R5383 Other fatigue: Secondary | ICD-10-CM

## 2021-12-07 DIAGNOSIS — G4733 Obstructive sleep apnea (adult) (pediatric): Secondary | ICD-10-CM | POA: Diagnosis not present

## 2021-12-07 DIAGNOSIS — F3289 Other specified depressive episodes: Secondary | ICD-10-CM

## 2021-12-07 DIAGNOSIS — I10 Essential (primary) hypertension: Secondary | ICD-10-CM | POA: Diagnosis not present
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
## 2021-12-08 ENCOUNTER — Ambulatory Visit (INDEPENDENT_AMBULATORY_CARE_PROVIDER_SITE_OTHER): Payer: 59 | Admitting: Family Medicine

## 2021-12-08 LAB — CBC WITH DIFFERENTIAL/PLATELET
Basophils Absolute: 0 10*3/uL (ref 0.0–0.2)
Basos: 1 %
EOS (ABSOLUTE): 0.2 10*3/uL (ref 0.0–0.4)
Eos: 2 %
Hematocrit: 44.3 % (ref 37.5–51.0)
Hemoglobin: 14.6 g/dL (ref 13.0–17.7)
Immature Grans (Abs): 0 10*3/uL (ref 0.0–0.1)
Immature Granulocytes: 0 %
Lymphocytes Absolute: 3.6 10*3/uL — ABNORMAL HIGH (ref 0.7–3.1)
Lymphs: 44 %
MCH: 30.3 pg (ref 26.6–33.0)
MCHC: 33 g/dL (ref 31.5–35.7)
MCV: 92 fL (ref 79–97)
Monocytes Absolute: 0.8 10*3/uL (ref 0.1–0.9)
Monocytes: 10 %
Neutrophils Absolute: 3.5 10*3/uL (ref 1.4–7.0)
Neutrophils: 43 %
Platelets: 202 10*3/uL (ref 150–450)
RBC: 4.82 x10E6/uL (ref 4.14–5.80)
RDW: 13.6 % (ref 11.6–15.4)
WBC: 8.1 10*3/uL (ref 3.4–10.8)

## 2021-12-08 LAB — T4, FREE: Free T4: 1.28 ng/dL (ref 0.82–1.77)

## 2021-12-08 LAB — COMPREHENSIVE METABOLIC PANEL
ALT: 53 IU/L — ABNORMAL HIGH (ref 0–44)
AST: 33 IU/L (ref 0–40)
Albumin/Globulin Ratio: 1.6 (ref 1.2–2.2)
Albumin: 4.5 g/dL (ref 4.0–5.0)
Alkaline Phosphatase: 87 IU/L (ref 44–121)
BUN/Creatinine Ratio: 19 (ref 9–20)
BUN: 19 mg/dL (ref 6–24)
Bilirubin Total: 0.7 mg/dL (ref 0.0–1.2)
CO2: 23 mmol/L (ref 20–29)
Calcium: 9.3 mg/dL (ref 8.7–10.2)
Chloride: 102 mmol/L (ref 96–106)
Creatinine, Ser: 1 mg/dL (ref 0.76–1.27)
Globulin, Total: 2.9 g/dL (ref 1.5–4.5)
Glucose: 90 mg/dL (ref 70–99)
Potassium: 4.4 mmol/L (ref 3.5–5.2)
Sodium: 143 mmol/L (ref 134–144)
Total Protein: 7.4 g/dL (ref 6.0–8.5)
eGFR: 97 mL/min/{1.73_m2} (ref >59)

## 2021-12-08 LAB — VITAMIN D 25 HYDROXY (VIT D DEFICIENCY, FRACTURES): Vit D, 25-Hydroxy: 25.7 ng/mL — ABNORMAL LOW (ref 30.0–100.0)

## 2021-12-08 LAB — VITAMIN B12: Vitamin B-12: 683 pg/mL (ref 232–1245)

## 2021-12-08 LAB — FOLATE: Folate: 17.9 ng/mL (ref >3.0)

## 2021-12-08 LAB — LIPID PANEL WITH LDL/HDL RATIO
Cholesterol, Total: 152 mg/dL (ref 100–199)
HDL: 62 mg/dL (ref >39)
LDL Chol Calc (NIH): 74 mg/dL (ref 0–99)
LDL/HDL Ratio: 1.2 ratio (ref 0.0–3.6)
Triglycerides: 82 mg/dL (ref 0–149)
VLDL Cholesterol Cal: 16 mg/dL (ref 5–40)

## 2021-12-08 LAB — HEMOGLOBIN A1C
Est. average glucose Bld gHb Est-mCnc: 126 mg/dL
Hgb A1c MFr Bld: 6 % — ABNORMAL HIGH (ref 4.8–5.6)

## 2021-12-08 LAB — INSULIN, RANDOM: INSULIN: 7.3 u[IU]/mL (ref 2.6–24.9)

## 2021-12-08 LAB — TSH: TSH: 1.7 u[IU]/mL (ref 0.450–4.500)

## 2021-12-09 NOTE — Progress Notes (Signed)
Dear Dr. Laurann Montana,   Thank you for referring Nathaniel Jones to our clinic. The following note includes my evaluation and treatment recommendations.  Chief Complaint:   OBESITY Nathaniel Jones (MR# [id number]) is a 41 y.o. male who presents for evaluation and treatment of obesity and related comorbidities. Current BMI is Body mass index is 39.12 kg/m. Nathaniel Jones has been struggling with his weight for many years and has been unsuccessful in either losing weight, maintaining weight loss, or reaching his healthy weight goal.  Nathaniel Jones is currently in the action stage of change and ready to dedicate time achieving and maintaining a healthier weight. Nathaniel Jones is interested in becoming our patient and working on intensive lifestyle modifications including (but not limited to) diet and exercise for weight loss.  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  He is single and lives with his 14-year-old son.  Eats out frequently.  He craves sweets, soda, and sugar.  Snacks on candy, ice cream, cookies.  Drinks 2-3 glasses of regular soda twice daily.  He did intermittent fasting before and lost 30 pounds.  He would like to continue that.  Nathaniel Jones's habits were reviewed today and are as follows: His family eats meals together, he struggles with family and or coworkers weight loss sabotage, his desired weight loss is 77 pounds, he has been heavy most of his life, he started gaining excessive weight in 2009, his heaviest weight ever was 335 pounds, he is a picky eater and doesn't like to eat healthier foods, he craves sweets, soda, and sugar, he snacks frequently in the evenings, he wakes up frequently in the middle of the night to eat, he skips breakfast frequently, he is frequently drinking liquids with calories, he frequently makes poor food choices, he has problems with excessive hunger, he frequently eats larger portions than normal, and he struggles with emotional eating.  Depression Screen Nathaniel Jones's Food and Mood  (modified PHQ-9) score was 21.  Depression screen PHQ 2/9 12/07/2021  Decreased Interest 3  Down, Depressed, Hopeless 3  PHQ - 2 Score 6  Altered sleeping 3  Tired, decreased energy 3  Change in appetite 3  Feeling bad or failure about yourself  3  Trouble concentrating 1  Moving slowly or fidgety/restless 1  Suicidal thoughts 1  PHQ-9 Score 21  Difficult doing work/chores Somewhat difficult   Subjective:   1. Other fatigue Nathaniel Jones admits to daytime somnolence and reports waking up still tired. Patient has a history of symptoms of daytime fatigue and morning fatigue. Nathaniel Jones generally gets 4 or 5 hours of sleep per night, and states that he has poor quality sleep. Snoring is not present. Apneic episodes are present. Epworth Sleepiness Score is 20.    2. Shortness of breath on exertion [name] notes increasing shortness of breath with exercising and seems to be worsening over time with weight gain. He notes getting out of breath sooner with activity than he used to. This has gotten worse recently. Nathaniel Jones denies shortness of breath at rest or orthopnea.  EKG done in January 2023 with Cardiology, which I reviewed, shows NSR and no abnormalities.  3. Prediabetes He was first diagnosed 2 years ago by his PCP.  No medications.  Positive family history.  Several first-degree relatives have diabetes mellitus.     4. Essential hypertension Nathaniel Jones has been on daily metoprolol since January 2022 after having COVID.  He had postviral pericarditis and was in the hospital for a couple  of days because of it.  He sees Cardiology.  5. OSA (obstructive sleep apnea) ESS is 20.  He was diagnosed 4 years ago.  Unable to wear CPAP mask.  He is currently being fitted for oral appliance for OSA by oral surgeon.  6. Other depression with emotional eating PHQ is 21.  He would never consider ending his life due to his son and having too much in life to look forward to.  7. At risk for diabetes mellitus Nathaniel Jones is at higher  than average risk for developing diabetes due to prediabetes and very strong family history.   Assessment/Plan:   Orders Placed This Encounter  Procedures   CBC with Differential/Platelet   Comprehensive metabolic panel   Folate   TSH   T4, free   Lipid Panel With LDL/HDL Ratio   Insulin, random   Hemoglobin A1c   Vitamin B12   VITAMIN D 25 Hydroxy (Vit-D Deficiency, Fractures)    Medications Discontinued During This Encounter  Medication Reason   colchicine 0.6 MG tablet    QUEtiapine (SEROQUEL) 25 MG tablet    Ibuprofen 200 MG CAPS    acetaminophen (TYLENOL) 500 MG tablet    Biotin 1000 MCG tablet    Ferrous Sulfate (IRON) 325 (65 Fe) MG TABS    Glucosamine-Chondroitin 500-400 MG CAPS    metoprolol succinate (TOPROL XL) 50 MG 24 hr tablet    Omega-3 Fatty Acids (FISH OIL) 1000 MG CAPS    sildenafil (VIAGRA) 100 MG tablet    Specialty Vitamins Products (COLLAGEN ULTRA) CAPS    Multiple Vitamin (MULTIVITAMIN WITH MINERALS) TABS tablet      No orders of the defined types were placed in this encounter.    1. Other fatigue Nathaniel Jones does feel that his weight is causing his energy to be lower than it should be. Fatigue may be related to obesity, depression or many other causes. Labs will be ordered, and in the meanwhile, Nathaniel Jones will focus on self care including making healthy food choices, increasing physical activity and focusing on stress reduction. Check labs, IC, prudent nutritional plan, weight loss.  - TSH - Vitamin B12 - VITAMIN D 25 Hydroxy (Vit-D Deficiency, Fractures)  2. Shortness of breath on exertion Nathaniel Jones does feel that he gets out of breath more easily that he used to when he exercises. Nathaniel Jones's shortness of breath appears to be obesity related and exercise induced. He has agreed to work on weight loss and gradually increase exercise to treat his exercise induced shortness of breath. Will continue to monitor closely.  3. Prediabetes Check labs.  Start prudent  nutritional plan, weight loss, will follow.  Cut out caloric beverages and decrease other single carb foods.  - Lipid Panel With LDL/HDL Ratio - Insulin, random - Hemoglobin A1c  4. Essential hypertension At goal today.  Check labs, continue medication per Cardiology.  Decrease salt intake, follow prudent nutritional plan, weight loss.  - CBC with Differential/Platelet - Comprehensive metabolic panel - Folate - TSH - T4, free - Lipid Panel With LDL/HDL Ratio  5. OSA (obstructive sleep apnea) Counseling done on the importance of controlling symptoms.  6. Other depression with emotional eating I recommend EAP to help with PTSD/depression symptoms.  Also placed referral to Dr. Mallie Mussel for emotional eating today.  7. At risk for diabetes mellitus - Saquan was given diabetes prevention education and counseling today of more than 23 minutes.  - Counseled patient on pathophysiology of disease and meaning/ implication of lab results.  -  Reviewed how certain foods can either stimulate or inhibit insulin release, and subsequently affect hunger pathways  - Importance of following a healthy meal plan with limiting amounts of simple carbohydrates discussed with patient - Effects of regular aerobic exercise on blood sugar regulation reviewed and encouraged an eventual goal of 30 min 5d/week or more as a minimum.  - Briefly discussed treatment options, which always include dietary and lifestyle modification as first line.   - Handouts provided at patient's desire and/or told to go online to the American Diabetes Association website for further information.   8. Obesity with current BMI of 39.2  Jeannette is currently in the action stage of change and his goal is to continue with weight loss efforts. I recommend Johathon begin the structured treatment plan as follows:  He has agreed to the Category 4 Plan.  Exercise goals:  As is.    Behavioral modification strategies: decreasing eating out, manage  emotional eating.  He was informed of the importance of frequent follow-up visits to maximize his success with intensive lifestyle modifications for his multiple health conditions. He was informed we would discuss his lab results at his next visit unless there is a critical issue that needs to be addressed sooner. Trevious agreed to keep his next visit at the agreed upon time to discuss these results.  Objective:   Blood pressure 122/76, pulse 64, temperature 97.9 F (36.6 C), height 6\' 3"  (1.905 m), weight (!) 313 lb (142 kg), SpO2 98 %. Body mass index is 39.12 kg/m.  Indirect Calorimeter completed today shows a VO2 of 419 and a REE of 2894.  His calculated basal metabolic rate is [id number] thus his basal metabolic rate is better than expected.  General: Cooperative, alert, well developed, in no acute distress. HEENT: Conjunctivae and lids unremarkable. Cardiovascular: Regular rhythm.  Lungs: Normal work of breathing. Neurologic: No focal deficits.   Lab Results  Component Value Date   CREATININE 1.00 12/07/2021   BUN 19 12/07/2021   NA 143 12/07/2021   K 4.4 12/07/2021   CL 102 12/07/2021   CO2 23 12/07/2021   Lab Results  Component Value Date   ALT 53 (H) 12/07/2021   AST 33 12/07/2021   ALKPHOS 87 12/07/2021   BILITOT 0.7 12/07/2021   Lab Results  Component Value Date   HGBA1C 6.0 (H) 12/07/2021   Lab Results  Component Value Date   INSULIN 7.3 12/07/2021   Lab Results  Component Value Date   TSH 1.700 12/07/2021   Lab Results  Component Value Date   CHOL 152 12/07/2021   HDL 62 12/07/2021   LDLCALC 74 12/07/2021   TRIG 82 12/07/2021   Lab Results  Component Value Date   WBC 8.1 12/07/2021   HGB 14.6 12/07/2021   HCT 44.3 12/07/2021   MCV 92 12/07/2021   PLT 202 12/07/2021   Attestation Statements:   Reviewed by clinician on day of visit: allergies, medications, problem list, medical history, surgical history, family history, social history, and previous  encounter notes.  I, Water quality scientist, CMA, am acting as Location manager for [employer], DO.  I have reviewed the above documentation for accuracy and completeness, and I agree with the above. Marjory Sneddon, D.O.  The [place] was signed into law in 2016 which includes the topic of electronic health records.  This provides immediate access to information in MyChart.  This includes consultation notes, operative notes, office notes, lab results and pathology reports.  If you have any questions about what you read please let us know at your next visit so we can discuss your concerns and take corrective action if need be.  We are right here with you.

## 2021-12-12 NOTE — Procedures (Signed)
Lumbosacral Transforaminal Epidural Steroid Injection - Sub-Pedicular Approach with Fluoroscopic Guidance  Patient: Nathaniel Jones      Date of Birth: March 30, 1981 MRN: [id number] PCP: [name], [name], MD      Visit Date: 11/29/2021   Universal Protocol:    Date/Time: 11/29/2021  Consent Given By: the patient  Position: PRONE  Additional Comments: Vital signs were monitored before and after the procedure. Patient was prepped and draped in the usual sterile fashion. The correct patient, procedure, and site was verified.   Injection Procedure Details:   Procedure diagnoses: Lumbar radiculopathy [M54.16]    Meds Administered:  Meds ordered this encounter  Medications   methylPREDNISolone acetate (DEPO-MEDROL) injection 80 mg    Laterality: Left  Location/Site: L5  Needle:5.0 in., 22 ga.  Short bevel or Quincke spinal needle  Needle Placement: Transforaminal  Findings:    -Comments: Excellent flow of contrast along the nerve, nerve root and into the epidural space.  Procedure Details: After squaring off the end-plates to get a true AP view, the C-arm was positioned so that an oblique view of the foramen as noted above was visualized. The target area is just inferior to the "nose of the scotty dog" or sub pedicular. The soft tissues overlying this structure were infiltrated with 2-3 ml. of 1% Lidocaine without Epinephrine.  The spinal needle was inserted toward the target using a "trajectory" view along the fluoroscope beam.  Under AP and lateral visualization, the needle was advanced so it did not puncture dura and was located close the 6 O'Clock position of the pedical in AP tracterory. Biplanar projections were used to confirm position. Aspiration was confirmed to be negative for CSF and/or blood. A 1-2 ml. volume of Isovue-250 was injected and flow of contrast was noted at each level. Radiographs were obtained for documentation purposes.   After attaining the desired flow of  contrast documented above, a 0.5 to 1.0 ml test dose of 0.25% Marcaine was injected into each respective transforaminal space.  The patient was observed for 90 seconds post injection.  After no sensory deficits were reported, and normal lower extremity motor function was noted,   the above injectate was administered so that equal amounts of the injectate were placed at each foramen (level) into the transforaminal epidural space.   Additional Comments:  No complications occurred Dressing: 2 x 2 sterile gauze and Band-Aid    Post-procedure details: Patient was observed during the procedure. Post-procedure instructions were reviewed.  Patient left the clinic in stable condition.

## 2021-12-12 NOTE — Progress Notes (Signed)
Nathaniel Jones - 41 y.o. male MRN [id number]  Date of birth: September 10, 1981  Office Visit Note: Visit Date: 11/29/2021 PCP: Alroy Dust, L.Marlou Sa, MD Referred by: Alroy Dust, L.Marlou Sa, MD  Subjective: Chief Complaint  Patient presents with   Lower Back - Pain   HPI:  Nathaniel Jones is a 41 y.o. male who comes in today at the request of Benita Stabile, PA-C for planned Left L5-S1 Lumbar Transforaminal epidural steroid injection with fluoroscopic guidance.  The patient has failed conservative care including home exercise, medications, time and activity modification.  This injection will be diagnostic and hopefully therapeutic.  Please see requesting physician notes for further details and justification.  ROS Otherwise per HPI.  Assessment & Plan: Visit Diagnoses:    ICD-10-CM   1. Lumbar radiculopathy  M54.16 XR C-ARM NO REPORT    Epidural Steroid injection    methylPREDNISolone acetate (DEPO-MEDROL) injection 80 mg      Plan: No additional findings.   Meds & Orders:  Meds ordered this encounter  Medications   methylPREDNISolone acetate (DEPO-MEDROL) injection 80 mg    Orders Placed This Encounter  Procedures   XR C-ARM NO REPORT   Epidural Steroid injection    Follow-up: Return if symptoms worsen or fail to improve.   Procedures: No procedures performed  Lumbosacral Transforaminal Epidural Steroid Injection - Sub-Pedicular Approach with Fluoroscopic Guidance  Patient: Nathaniel Jones      Date of Birth: [birth year] MRN: [id number] PCP: Alroy Dust, L.Marlou Sa, MD      Visit Date: 11/29/2021   Universal Protocol:    Date/Time: 11/29/2021  Consent Given By: the patient  Position: PRONE  Additional Comments: Vital signs were monitored before and after the procedure. Patient was prepped and draped in the usual sterile fashion. The correct patient, procedure, and site was verified.   Injection Procedure Details:   Procedure diagnoses: Lumbar radiculopathy [M54.16]    Meds Administered:   Meds ordered this encounter  Medications   methylPREDNISolone acetate (DEPO-MEDROL) injection 80 mg    Laterality: Left  Location/Site: L5  Needle:5.0 in., 22 ga.  Short bevel or Quincke spinal needle  Needle Placement: Transforaminal  Findings:    -Comments: Excellent flow of contrast along the nerve, nerve root and into the epidural space.  Procedure Details: After squaring off the end-plates to get a true AP view, the C-arm was positioned so that an oblique view of the foramen as noted above was visualized. The target area is just inferior to the "nose of the scotty dog" or sub pedicular. The soft tissues overlying this structure were infiltrated with 2-3 ml. of 1% Lidocaine without Epinephrine.  The spinal needle was inserted toward the target using a "trajectory" view along the fluoroscope beam.  Under AP and lateral visualization, the needle was advanced so it did not puncture dura and was located close the 6 O'Clock position of the pedical in AP tracterory. Biplanar projections were used to confirm position. Aspiration was confirmed to be negative for CSF and/or blood. A 1-2 ml. volume of Isovue-250 was injected and flow of contrast was noted at each level. Radiographs were obtained for documentation purposes.   After attaining the desired flow of contrast documented above, a 0.5 to 1.0 ml test dose of 0.25% Marcaine was injected into each respective transforaminal space.  The patient was observed for 90 seconds post injection.  After no sensory deficits were reported, and normal lower extremity motor function was noted,   the above injectate was administered so that  equal amounts of the injectate were placed at each foramen (level) into the transforaminal epidural space.   Additional Comments:  No complications occurred Dressing: 2 x 2 sterile gauze and Band-Aid    Post-procedure details: Patient was observed during the procedure. Post-procedure instructions were  reviewed.  Patient left the clinic in stable condition.    Clinical History: No specialty comments available.     Objective:  VS:  HT:     WT:    BMI:      BP:(!) 159/96   HR:74bpm   TEMP: ( )   RESP:  Physical Exam Vitals and nursing note reviewed.  Constitutional:      General: He is not in acute distress.    Appearance: Normal appearance. He is obese. He is not ill-appearing.  HENT:     Head: Normocephalic and atraumatic.     Right Ear: External ear normal.     Left Ear: External ear normal.     Nose: No congestion.  Eyes:     Extraocular Movements: Extraocular movements intact.  Cardiovascular:     Rate and Rhythm: Normal rate.     Pulses: Normal pulses.  Pulmonary:     Effort: Pulmonary effort is normal. No respiratory distress.  Abdominal:     General: There is no distension.     Palpations: Abdomen is soft.  Musculoskeletal:        General: No tenderness or signs of injury.     Cervical back: Neck supple.     Right lower leg: No edema.     Left lower leg: No edema.     Comments: Patient has good distal strength without clonus.  Skin:    Findings: No erythema or rash.  Neurological:     General: No focal deficit present.     Mental Status: He is alert and oriented to person, place, and time.     Sensory: No sensory deficit.     Motor: No weakness or abnormal muscle tone.     Coordination: Coordination normal.  Psychiatric:        Mood and Affect: Mood normal.        Behavior: Behavior normal.     Imaging: No results found.

## 2021-12-20 ENCOUNTER — Telehealth (INDEPENDENT_AMBULATORY_CARE_PROVIDER_SITE_OTHER): Payer: 59 | Admitting: Psychology

## 2021-12-21 ENCOUNTER — Encounter (INDEPENDENT_AMBULATORY_CARE_PROVIDER_SITE_OTHER): Payer: Self-pay | Admitting: Family Medicine

## 2021-12-21 ENCOUNTER — Ambulatory Visit (INDEPENDENT_AMBULATORY_CARE_PROVIDER_SITE_OTHER): Payer: 59 | Admitting: Family Medicine

## 2021-12-21 ENCOUNTER — Other Ambulatory Visit: Payer: Self-pay

## 2021-12-21 VITALS — BP 124/71 | HR 51 | Temp 98.3°F | Ht 75.0 in | Wt 316.0 lb

## 2021-12-21 DIAGNOSIS — E559 Vitamin D deficiency, unspecified: Secondary | ICD-10-CM

## 2021-12-21 DIAGNOSIS — Z9189 Other specified personal risk factors, not elsewhere classified: Secondary | ICD-10-CM

## 2021-12-21 DIAGNOSIS — K760 Fatty (change of) liver, not elsewhere classified: Secondary | ICD-10-CM

## 2021-12-21 DIAGNOSIS — I10 Essential (primary) hypertension: Secondary | ICD-10-CM

## 2021-12-21 DIAGNOSIS — G4733 Obstructive sleep apnea (adult) (pediatric): Secondary | ICD-10-CM

## 2021-12-21 DIAGNOSIS — R7303 Prediabetes: Secondary | ICD-10-CM

## 2021-12-21 DIAGNOSIS — E669 Obesity, unspecified: Secondary | ICD-10-CM

## 2021-12-21 DIAGNOSIS — Z6839 Body mass index (BMI) 39.0-39.9, adult: Secondary | ICD-10-CM

## 2021-12-21 DIAGNOSIS — F3289 Other specified depressive episodes: Secondary | ICD-10-CM

## 2021-12-21 MED ORDER — VITAMIN D (ERGOCALCIFEROL) 1.25 MG (50000 UNIT) PO CAPS
50000.0000 [IU] | ORAL_CAPSULE | ORAL | 0 refills | Status: DC
Start: 2021-12-21 — End: 2022-09-20

## 2021-12-22 NOTE — Progress Notes (Signed)
?Office: [phone]  /  Fax: [phone] ? ? ? ?Date: 12/27/2021   ?Appointment Start Time: 3:01pm ?Duration: 49 minutes ?Provider: Lawerance Cruel, Psy.D. ?Type of Session: Intake for Individual Therapy  ?Location of Patient: Parked in car outside of home (safe/private location) ?Location of Provider: Provider's home (private office) ?Type of Contact: Telepsychological Visit via MyChart Video Visit ? ?Informed Consent: Prior to proceeding with today's appointment, two pieces of identifying information were obtained. In addition, Stockton's physical location at the time of this appointment was obtained as well a phone number he could be reached at in the event of technical difficulties. Kazuo and this provider participated in today's telepsychological service.  ? ?The provider's role was explained to [employer]. The provider reviewed and discussed issues of confidentiality, privacy, and limits therein (e.g., reporting obligations). In addition to verbal informed consent, written informed consent for psychological services was obtained prior to the initial appointment. Since the clinic is not a 24/7 crisis center, mental health emergency resources were shared and this  provider explained MyChart, e-mail, voicemail, and/or other messaging systems should be utilized only for non-emergency reasons. This provider also explained that information obtained during appointments will be placed in [facility] medical record and relevant information will be shared with other providers at [facility] for coordination of care. Satish agreed information may be shared with other [facility] providers as needed for coordination of care and by signing the service agreement document, he provided written consent for coordination of care. Prior to initiating telepsychological services, Keiston completed an informed consent document, which included the development of a safety plan (i.e., an emergency contact and emergency  resources) in the event of an emergency/crisis. Damier verbally acknowledged understanding he is ultimately responsible for understanding his insurance benefits for telepsychological and in-person services. This provider also reviewed confidentiality, as it relates to telepsychological services, as well as the rationale for telepsychological services (i.e., to reduce exposure risk to COVID-19). Beni  acknowledged understanding that appointments cannot be recorded without both party consent and he is aware he is responsible for securing confidentiality on his end of the session. Tjay verbally consented to proceed. ? ?Chief Complaint/HPI: Ethan was referred by Dr. Thomasene Lot due to other depression, with emotional eating. Per the note for the initial visit with Dr. Thomasene Lot on 12/07/2021, "PHQ is 21.  He would never consider ending his life due to his son and having too much in life to look forward to." The note for the initial appointment further indicated the following: "Hagop's habits were reviewed today and are as follows: His family eats meals together, he struggles with family and or coworkers weight loss sabotage, his desired weight loss is 77 pounds, he has been heavy most of his life, he started gaining excessive weight in 2009, his heaviest weight ever was 335 pounds, he is a picky eater and doesn't like to eat healthier foods, he craves sweets, soda, and sugar, he snacks frequently in the evenings, he wakes up frequently in the middle of the night to eat, he skips breakfast frequently, he is frequently drinking liquids with calories, he frequently makes poor food choices, he has problems with excessive hunger, he frequently eats larger portions than normal, and he struggles with emotional eating." Baylen's Food and Mood (modified PHQ-9) score on 12/07/2021 was 21. ? ?During today's appointment, Renaud reported using food for comfort when upset and stressed. He was verbally administered a questionnaire  assessing various behaviors related to emotional  eating behaviors. Murtaza endorsed the following: overeat when you are celebrating, experience food cravings on a regular basis, eat certain foods when you are anxious, stressed, depressed, or your feelings are hurt, use food to help you cope with emotional situations, find food is comforting to you, overeat when you are angry or upset, overeat frequently when you are bored or lonely, not worry about what you eat when you are in a good mood, overeat when you are alone, but eat much less when you are with other people, eat to help you stay awake, and eat as a reward. He shared he craves sweets (e.g., soda, ice cream, candy, cookies) and chips. Zakry believes the onset of emotional eating behaviors was likely in childhood and described the current frequency of emotional eating behaviors as "couple times a month." In addition, Jamol endorsed a history of binge eating behaviors. He reported pizza, chips, soda, and ice cream are common when engaging in binge eating behaviors, adding some times he will eat the aforementioned foods in one sitting or it may be consumed over the course of the day. Arren described the frequency of binge eating behaviors as 2-3xs during the month, noting the last time he engaged in the aforementioned was last Saturday. He recalled watching a basketball game and ordering pizza, which then led him to eating other foods. He described feeling guilty when engaging in emotional and binge eating behaviors. Dehaven denied a history of significantly restricting food intake, purging and engagement in other compensatory strategies, and has never been diagnosed with an eating disorder. He also denied a history of treatment for emotional or binge eating behaviors. Furthermore, Loraine Leriche discussed ongoing sleep issues, noting he was diagnosed with sleep apnea. He is in the process of finding an alternative to a CPAP.  ? ?Mental Status Examination:  ?Appearance:  neat ?Behavior: appropriate to circumstances ?Mood: neutral ?Affect: mood congruent ?Speech: WNL ?Eye Contact: appropriate ?Psychomotor Activity: WNL ?Gait: unable to assess  ?Thought Process: linear, logical, and goal directed and denies suicidal, homicidal, and self-harm ideation, plan and intent  ?Thought Content/Perception: no hallucinations, delusions, bizarre thinking or behavior endorsed or observed ?Orientation: AAOx4 ?Memory/Concentration: memory, attention, language, and fund of knowledge intact  ?Insight/Judgment: fair ? ?Family & Psychosocial History: Ghassan reported he is not in a relationship and he has a son (age 27). He indicated he is currently employed with the [employer]. 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 Additionally, Colin shared his highest level of education obtained is a bachelor's degree. Currently, Toryn's social support system consists of his two friends. Moreover, Wen stated he resides with his son.  ? ?Medical History:  ?Past Medical History:  ?Diagnosis Date  ? Arthritis of both knees   ? Back pain   ? Chest pain   ? Constipation   ? Depression   ? Food allergy   ? coconut  ? Hypertension   ? Joint pain   ? Low back pain   ? herniated disc  ? Lumbar herniated disc   ? Obesity   ? OSA (obstructive sleep apnea)   ? has not yet received CPAP  ? Other fatigue   ? Pericarditis   ? in his 36s  ? Prediabetes   ? Shortness of breath   ? Shortness of breath on exertion   ? ?Past Surgical History:  ?Procedure Laterality Date  ? KNEE SURGERY Right 2016  ? ?Current Outpatient Medications on File Prior to Visit  ?Medication Sig  Dispense Refill  ? allopurinol (ZYLOPRIM) 100 MG tablet Take 100 mg by mouth daily.    ? colchicine 0.6 MG tablet Take 0.6 mg by mouth 2 (two) times daily.    ? cyclobenzaprine (FLEXERIL) 5 MG tablet Take 1 tablet (5 mg total) by mouth 3 (three) times daily as needed. 40 tablet 1  ? ibuprofen (ADVIL) 600 MG tablet Take 600 mg by mouth in the morning and at  bedtime.    ? metoprolol succinate (TOPROL-XL) 50 MG 24 hr tablet Take 50 mg by mouth daily.    ? pantoprazole (PROTONIX) 40 MG tablet Take 1 tablet (40 mg total) by mouth daily. 90 tablet 1  ? QUEtiapine (SEROQUEL)

## 2021-12-27 ENCOUNTER — Telehealth (INDEPENDENT_AMBULATORY_CARE_PROVIDER_SITE_OTHER): Payer: 59 | Admitting: Psychology

## 2021-12-27 DIAGNOSIS — F4320 Adjustment disorder, unspecified: Secondary | ICD-10-CM

## 2021-12-27 DIAGNOSIS — F5089 Other specified eating disorder: Secondary | ICD-10-CM

## 2021-12-27 NOTE — Progress Notes (Signed)
? ? ? ?Chief Complaint:  ? ?OBESITY ?Nathaniel Jones is here to discuss his progress with his obesity treatment plan along with follow-up of his obesity related diagnoses. Demarcos is on the Category 4 Plan and states he is following his eating plan approximately 75% of the time. Rae states he is doing E2M, cardio, and basketball for 45 minutes 5 times per week. ? ?Today's visit was #: 2 ?Starting weight: 313 lbs ?Starting date: 12/07/2021 ?Today's weight: 316 lbs ?Today's date: 12/21/2021 ?Total lbs lost to date: 0 ?Total lbs lost since last in-office visit: 0 ? ?Interim History: Nathaniel Jones is here today for his first follow-up office visit since starting the program with [place]. All blood work/ lab tests that were recently ordered by myself or an outside provider were reviewed with patient today per their request. Extended time was spent counseling him on all new disease processes that were discovered or preexisting ones that are affected by BMI.  he understands that many of these abnormalities will need to monitored regularly along with the current treatment plan of prudent dietary changes, in which we are making each and every office visit, to improve these health parameters. During the week Nathaniel Jones is home around 9:30 pm and he feels hungry after working out. 12-8 pm is when he eats, does intermittent fasting currently. Today all he had all day was 1 tuna sandwich with unknown amount of tuna and water.  ? ?Subjective:  ? ?1. Pre-diabetes ?Nathaniel Jones's A1c ia now 6.0. He is not on medications. He denies hunger or cravings. I discussed labs with the patient today. ? ?2. Essential hypertension ?Nathaniel Jones has been on metoprolol daily since after COVID in January 2022, when he was diagnosed with postviral pericarditis. He sees Cardiology. He is on colchicine for pericarditis as well. I discussed labs with the patient today. ? ?3. Vitamin D deficiency ?Nathaniel Jones has a new diagnosis of Vit D deficiency. He notes fatigue/tired most of the day. I discussed  labs with the patient today. ? ?4. NAFLD (nonalcoholic fatty liver disease) ?Nathaniel Jones has a new diagnosis of NAFLD. He doesn't drink ETOH, and no prior history of elevated ALT. ALT now t 53. He denies excessive Tylenol use. He states he takes "a lot of Vitamins and supplementations". I discussed labs with the patient today. ? ?5. OSA (obstructive sleep apnea) ?Nathaniel Jones cant afford oral appliance for his CPAP. Unable to tolerate CPAP mask. He sees sleep medicine doctor. I discussed labs with the patient today. ? ?6. Other depression with emotional eating ?Meet was referred to Dr. Mallie Mussel but he was not able to keep his appointment due to family emergency. He did contact EAP at work and he is awaiting a call back. He denies suicidal ideations and his mood is stable. ? ?7. At risk for deficient intake of food ?Nathaniel Jones is at a higher than average risk of deficient intake of food due to poor intake. ? ?Assessment/Plan:  ?No orders of the defined types were placed in this encounter. ? ? ?There are no discontinued medications.  ? ?Meds ordered this encounter  ?Medications  ? Vitamin D, Ergocalciferol, (DRISDOL) 1.25 MG (50000 UNIT) CAPS capsule  ?  Sig: Take 1 capsule (50,000 Units total) by mouth every 7 (seven) days.  ?  Dispense:  4 capsule  ?  Refill:  0  ?  ? ?1. Pre-diabetes ?Handouts given after an extensive discussion was had with the patient about the diagnosis. Nathaniel Jones is to decrease simple carbohydrates and follow his prudent  nutritional plan, and lose weight. We will continue to monitor labs every 3-4 months. ? ?2. Essential hypertension ?Nathaniel Jones's blood pressures is at goal. He will continue to follow up with Cardiology for pericarditis treatment plan. He is to decrease salt, continue prudent nutritional plan, and weight loss. We will follow alongside with Cardiology. ? ?3. Vitamin D deficiency ?Nathaniel Jones agreed to start prescription Vitamin D 50,000 IU weekly, with no refills.  ? ?- I discussed the importance of vitamin D to the  patient's health and well-being.  ?- I reviewed possible symptoms of low Vitamin D:  low energy, depressed mood, muscle aches, joint aches, osteoporosis etc. was reviewed with patient ?- low Vitamin D levels may be linked to an increased risk of cardiovascular events and even increased risk of cancers- such as colon and breast.  ?- ideal vitamin D levels reviewed with patient  ?- I recommend pt take a 50,000 IU weekly prescription vit D - see script below   ?- Informed patient this may be a lifelong thing, and he was encouraged to continue to take the medicine until told otherwise.    ?- weight loss will likely improve availability of vitamin D, thus encouraged [name] to continue with meal plan and their weight loss efforts to further improve this condition.  Thus, we will need to monitor levels regularly (every 3-4 mo on average) to keep levels within normal limits and prevent over supplementation. ?- pt's questions and concerns regarding this condition addressed. ? ?- Vitamin D, Ergocalciferol, (DRISDOL) 1.25 MG (50000 UNIT) CAPS capsule; Take 1 capsule (50,000 Units total) by mouth every 7 (seven) days.  Dispense: 4 capsule; Refill: 0 ? ?4. NAFLD (nonalcoholic fatty liver disease) ?Diagnosis counseling was done. Recommended that he continues his prudent nutritional plan, lose 5-10 % of his weight. He is to avoid hepatotoxic substances and we will recheck labs in 3-4 months.  ? ?5. OSA (obstructive sleep apnea) ?Nathaniel Jones will work with his sleep medicine doctor on alternative, affordable treatment plans. Importance of controlling his symptoms were discussed with the patient. Follow up until he gets treatment. ? ?6. Other depression with emotional eating ?Nathaniel Jones will follow up with EAP counselor, and make an appointment and reschedule with Dr. Mallie Mussel.  ? ?7. At risk for deficient intake of food ?Nathaniel Jones was given approximately 23 minutes of deficient intake of food prevention counseling today. Nathaniel Jones is at risk for eating too  few calories based on current food recall. He was encouraged to focus on meeting caloric and protein goals according to his recommended meal plan. ? ?8. Obesity with current BMI of 39.6 ?Cassiel is currently in the action stage of change. As such, his goal is to continue with weight loss efforts. He has agreed to the Category 4 Plan.  ? ?Exercise goals: As is. ? ?Behavioral modification strategies: increasing lean protein intake, decreasing simple carbohydrates, no skipping meals, meal planning and cooking strategies, and planning for success. ? ?Mouhamad has agreed to follow-up with our clinic in 2 to 3 weeks. He was informed of the importance of frequent follow-up visits to maximize his success with intensive lifestyle modifications for his multiple health conditions.  ? ?Objective:  ? ?Blood pressure 124/71, pulse (!) 51, temperature 98.3 ?F (36.8 ?C), height 6\' 3"  (1.905 m), weight (!) 316 lb (143.3 kg), SpO2 98 %. ?Body mass index is 39.5 kg/m?. ? ?General: Cooperative, alert, well developed, in no acute distress. ?HEENT: Conjunctivae and lids unremarkable. ?Cardiovascular: Regular rhythm.  ?Lungs: Normal work of breathing. ?  Neurologic: No focal deficits.  ? ?Lab Results  ?Component Value Date  ? CREATININE 1.00 12/07/2021  ? BUN 19 12/07/2021  ? NA 143 12/07/2021  ? K 4.4 12/07/2021  ? CL 102 12/07/2021  ? CO2 23 12/07/2021  ? ?Lab Results  ?Component Value Date  ? ALT 53 (H) 12/07/2021  ? AST 33 12/07/2021  ? ALKPHOS 87 12/07/2021  ? BILITOT 0.7 12/07/2021  ? ?Lab Results  ?Component Value Date  ? HGBA1C 6.0 (H) 12/07/2021  ? ?Lab Results  ?Component Value Date  ? INSULIN 7.3 12/07/2021  ? ?Lab Results  ?Component Value Date  ? TSH 1.700 12/07/2021  ? ?Lab Results  ?Component Value Date  ? CHOL 152 12/07/2021  ? HDL 62 12/07/2021  ? Colon 74 12/07/2021  ? TRIG 82 12/07/2021  ? ?Lab Results  ?Component Value Date  ? VD25OH 25.7 (L) 12/07/2021  ? ?Lab Results  ?Component Value Date  ? WBC 8.1 12/07/2021  ? HGB 14.6  12/07/2021  ? HCT 44.3 12/07/2021  ? MCV 92 12/07/2021  ? PLT 202 12/07/2021  ? ?No results found for: IRON, TIBC, FERRITIN ? ?Attestation Statements:  ? ?Reviewed by clinician on day of visit: allergie

## 2021-12-28 NOTE — Progress Notes (Unsigned)
?  Office: [phone]  /  Fax: [phone] ? ? ? ?Date: 01/11/2022   ?Appointment Start Time: *** ?Duration: *** minutes ?Provider: Glennie Isle, Psy.D. ?Type of Session: Individual Therapy  ?Location of Patient: {gbptloc:23249} (private location) ?Location of Provider: Provider's Home (private office) ?Type of Contact: Telepsychological Visit via MyChart Video Visit ? ?Session Content: This provider called [name] at 2:02pm as he did not present for today's appointment. A HIPAA compliant voicemail was left requesting a call back. As such, today's appointment was initiated *** minutes late.[name] is a 41 y.o. male presenting for a follow-up appointment to address the previously established treatment goal of increasing coping skills.Today's appointment was a telepsychological visit due to COVID-19. [name] provided verbal consent for today's telepsychological appointment and he is aware he is responsible for securing confidentiality on his end of the session. Prior to proceeding with today's appointment, [name]'s physical location at the time of this appointment was obtained as well a phone number he could be reached at in the event of technical difficulties. Inri and this provider participated in today's telepsychological service.  ? ?This provider conducted a brief check-in. *** [name] was receptive to today's appointment as evidenced by openness to sharing, responsiveness to feedback, and {gbreceptiveness:23401}. ? ?Mental Status Examination:  ?Appearance: {Appearance:22431} ?Behavior: {Behavior:22445} ?Mood: {gbmood:21757} ?Affect: {Affect:22436} ?Speech: {Speech:22432} ?Eye Contact: {Eye Contact:22433} ?Psychomotor Activity: {Motor Activity:22434} ?Gait: {gbgait:23404} ?Thought Process: {thought process:22448}  ?Thought Content/Perception: {disturbances:22451} ?Orientation: {Orientation:22437} ?Memory/Concentration: {gbcognition:22449} ?Insight: {Insight:22446} ?Judgment: {Insight:22446} ? ?Interventions:  ?{Interventions  for Progress Notes:23405} ? ?DSM-5 Diagnosis(es): F50.89 Other Specified Feeding or Eating Disorder, Emotional and Binge Eating Behaviors and  F43.20 Adjustment Disorder, Unspecified  ? ?Treatment Goal & Progress: During the initial appointment with this provider, the following treatment goal was established: increase coping skills. [name] has demonstrated progress in his goal as evidenced by {gbtxprogress:22839}. [name] also {gbtxprogress2:22951}. ? ?Plan: The next appointment will be scheduled in {gbweeks:21758}, which will be via MyChart Video Visit. The next session will focus on {Plan for Next Appointment:23400}. ? ?

## 2022-01-05 ENCOUNTER — Ambulatory Visit (INDEPENDENT_AMBULATORY_CARE_PROVIDER_SITE_OTHER): Payer: 59 | Admitting: Family Medicine

## 2022-01-11 ENCOUNTER — Telehealth (INDEPENDENT_AMBULATORY_CARE_PROVIDER_SITE_OTHER): Payer: 59 | Admitting: Psychology

## 2022-01-11 ENCOUNTER — Telehealth (INDEPENDENT_AMBULATORY_CARE_PROVIDER_SITE_OTHER): Payer: Self-pay | Admitting: Psychology

## 2022-01-11 NOTE — Telephone Encounter (Signed)
?  Office: [phone]  /  Fax: [phone] ? ?Date of Call: January 11, 2022  ?Time of Call: 2:02pm ?Provider: Lawerance Cruel, PsyD ? ?CONTENT: This provider called Loraine Leriche to check-in as he did not present for today's MyChart Video Visit appointment at 2pm. A HIPAA compliant voicemail was left requesting a call back. Of note, this provider stayed on the MyChart Video Visit appointment for 5 minutes prior to signing off per the clinic's grace period policy.   ? ?PLAN: This provider will wait for Sultan to call back. No further follow-up planned by this provider.  ? ?

## 2022-01-26 DIAGNOSIS — R739 Hyperglycemia, unspecified: Secondary | ICD-10-CM | POA: Insufficient documentation

## 2022-02-16 ENCOUNTER — Encounter: Payer: Self-pay | Admitting: Cardiovascular Disease

## 2022-02-16 ENCOUNTER — Ambulatory Visit: Payer: 59 | Admitting: Cardiovascular Disease

## 2022-02-16 DIAGNOSIS — R079 Chest pain, unspecified: Secondary | ICD-10-CM

## 2022-02-16 DIAGNOSIS — R072 Precordial pain: Secondary | ICD-10-CM

## 2022-02-16 DIAGNOSIS — I319 Disease of pericardium, unspecified: Secondary | ICD-10-CM | POA: Diagnosis not present

## 2022-02-16 DIAGNOSIS — I10 Essential (primary) hypertension: Secondary | ICD-10-CM | POA: Diagnosis not present

## 2022-02-16 DIAGNOSIS — E559 Vitamin D deficiency, unspecified: Secondary | ICD-10-CM

## 2022-02-16 DIAGNOSIS — G4733 Obstructive sleep apnea (adult) (pediatric): Secondary | ICD-10-CM

## 2022-02-16 NOTE — Patient Instructions (Signed)
Medication Instructions:  ?You may take ibuprofen 400 mg three times daily as needed.  ?*If you need a refill on your cardiac medications before your next appointment, please call your pharmacy* ? ? ?Lab Work: ?CMET, CBC, Sed rate, CRP today  ? ?If you have labs (blood work) drawn today and your tests are completely normal, you will receive your results only by: ?MyChart Message (if you have MyChart) OR ?A paper copy in the mail ?If you have any lab test that is abnormal or we need to change your treatment, we will call you to review the results. ? ? ?Testing/Procedures: ?Echocardiogram - Your physician has requested that you have an echocardiogram. Echocardiography is a painless test that uses sound waves to create images of your heart. It provides your doctor with information about the size and shape of your heart and how well your heart?s chambers and valves are working. This procedure takes approximately one hour. There are no restrictions for this procedure.  ? ? ? ?Follow-Up: ?At [facility], you and your health needs are our priority.  As part of our continuing mission to provide you with exceptional heart care, we have created designated Provider Care Teams.  These Care Teams include your primary Cardiologist (physician) and Advanced Practice Providers (APPs -  Physician Assistants and Nurse Practitioners) who all work together to provide you with the care you need, when you need it. ? ?We recommend signing up for the patient portal called "MyChart".  Sign up information is provided on this After Visit Summary.  MyChart is used to connect with patients for Virtual Visits (Telemedicine).  Patients are able to view lab/test results, encounter notes, upcoming appointments, etc.  Non-urgent messages can be sent to your provider as well.   ?To learn more about what you can do with MyChart, go to [email or web address].   ? ?Your next appointment:   ?6 month(s) ? ?The format for your next appointment:   ?In  Person ? ?Provider:   ?Nicki Guadalajara, MD   ? ? ? ? ? ? ? ? ?

## 2022-02-16 NOTE — Progress Notes (Signed)
Cardiology Office Note    Date:  02/23/2022   ID:  Nathaniel Jones, DOB [birth year], MRN [id number]  PCP:  Aurea Graff.Marlou Sa, MD  Cardiologist:  Shelva Majestic, MD   4 month F/U  History of Present Illness:  Nathaniel Jones is a 41 y.o. male who I had seen in the past for evaluation of chest pain with atypical features as well as sleepiness.  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  Since his playing days when his weight was 255 pounds he has gained weight and has been around 315.  He has a history of hypertension.  In 2018 he had a normal nuclear stress test and an echo Doppler study showed an EF of 55% with moderate LVH with mild left atrial enlargement and mild TR.  He has obstructive sleep apnea and has had issues with significant excessive daytime sleepiness.  Remotely had been evaluated by Dr. Rexene Alberts and was reevaluated by me on a home study in October 2021 which showed mild overall sleep apnea with an AHI of 10 but in the past he had been noted to have severe OSA during REM sleep which was unable to be assessed on the home study.  He never had a CPAP titration or AutoPap initiation.  He developed COVID-19 infection on October 02, 2020.  He presented to the emergency room on October 27, 2020 with headaches, chills myalgias and fever.  He was sent home.  He returned on January 27 with neck pain and significant headache and apparently had a spinal tap which was negative.  A chest x-ray was suspicious for possible pneumonia and he was discharged on Augmentin.  He represented the next day with left precordial chest pain and low-grade fever as well as mild cough.  He was admitted to the hospital on October 31, 2020.  CT of the chest was negative for PE and was felt he had pericarditis and possible consolidation of his right middle upper lobe.  He continued to have low fever.  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   Blood cultures were negative.  Procalcitonin was greater than 4, CRP was 40.  Repeat Covid 19 PCR test was negative.  An echo Doppler study showed an EF of 60 to 65% with grade 1 diastolic dysfunction there was no mention of any pericardial effusion.  He was treated with Rocephin and azithromycin and was recommended to take colchicine and continue allopurinol with his history of gout.  The patient was to have a repeat chest x-ray after completion of antibiotic therapy.  He was evaluated by [name] on November 18, 2020 and admitted to feeling terrible.  He had finished his antibiotic therapy for pneumonia and sinusitis and admitted to being breathless after 1 flight of steps and even taking a shower left knee is significantly fatigued.  He has not yet returned to work.  Due to diagnosis of pericarditis it was recommended he complete 3 months of colchicine therapy she started him on Relafen instead of ibuprofen but he did not notice any significant change and she had a Protonix 40 mg daily.    I saw him on December 04, 2020 at which time he continued to experience increasing shortness of breath with activity and admitted to being very weak.  He was experiencing episodes of sharp stabbing pain when he was lying down on the left chest region.  He no longer had a  fever.  He had not had a follow-up chest x-ray.  He was not sleeping well.  I recommended he undergo a PA and lateral chest x-ray as well as laboratory.  CRP was 1.  Hemoglobin and hematocrit were 12.9 and 39.5.  Creatinine was 1.09.  Erythrocyte sedimentation rate had improved and was now 18 compared to 98 in January and 40 on November 18, 2020.  A chest x-ray January 15, 2021 was negative for acute cardiopulmonary disease.  I scheduled him for cardiac MRI which was done on January 18, 2021 was essentially normal and showed normal LV chamber size with mild increased wall thickness of 12 mm.  LVEF was 60% without regional wall motion abnormalities.  There was  no evidence for myocardial edema.  There was no postcontrast delayed myocardial enhancement.  RV function was normal with an EF of 53%.  Valves were normal.  The pericardium was normal.  His aorta was mildly dilated at the level of the sinus Valsalva measuring 41 mm.  I saw him on January 18, 2021 in follow-up office visit.  At that time he felt better but had not yet returned to work.  However he was still experiencing some pain while at sleeping on his back and reason was sleeping on his left side.  He described the pain as a knifelike sensation is also noted a vague residual dullness.  During that evaluation, I recommended that he can return to work.  I felt his chest pain was nonischemic and chest wall discomfort.  He was still waiting for his machine and I recommended institution of AutoPap therapy.  I saw him as an add-on on Feb 15, 2021.  Since his previous evaluation he had  called the office on January 28, 2021 after he experienced recurrent sharp chest pain episodes after he had had a heated discussion with his supervisor at work.  He had noted that his heart rate increases when he lies on his back even before he falls asleep with heart rates in the upper 90s.  He has tried instituting AutoPap therapy.  However he is only been sleeping several hours with it.  He has been having difficulty with the full facemask that was provided to him which covers the nose and mouth.  He was added onto my schedule for follow-up evaluation.  During that evaluation, his ECG showed sinus rhythm at 72 bpm with previously noted T wave abnormality in leads III and aVF.  He was experiencing knifelike chest pain which seem to be precipitated by [place] and was taking ibuprofen 600 mg twice a day with benefit.  With his recent heart rate increase I suggested a trial of increasing metoprolol succinate from 25 to 50 mg.  I had a long discussion with him regarding his initiation of CPAP therapy.  Apparently, [name] CPAP machine was  picked up by the DME company on August 2 due to very poor compliance and no recent usage.  He was seen by Dr. Grayling Congress on July 30, 2021 complaining of 2 to 3 weeks of intermittent chest tightness more described as a sharp pain on the right which then moved to his left chest.  It also had pleuritic component being worse with deep inspiration.  The time he was very concerned about recurrent pericarditis.  Dr. Davina Poke was not convinced this was pericarditis and that may be just pleurisy related to a chest cold.  However sed rate and CRP was obtained and he was started on therapy with  indomethacin 25 mg 3 times a day for 1 week.  Sedimentation rate was normal at 8 and C-reactive protein was increased at 19.  Subsequently, he was reevaluated on August 03, 2021 Nathaniel Jones, [place] after he had called the office complaining of worsening symptoms.  He was complaining of pleuritic chest pain with sharp pain with deep inspiration and laying down which was the same pain that he had experienced with pericarditis.  At that time, he was represcribed colchicine 0.6 mg twice a day and he was wondering about getting an antibiotic but it was felt that most likely he had a nonbacterial etiology and antibiotic was not prescribed.  I saw him as an add-on in August 25, 2021.  He had been  evaluated in urgent care on November 16 due to concerns about worsening chest pain and shortness of breath.  A chest x-ray was obtained and he was started on Levaquin and prednisone for presumed walking pneumonia.  August 24, 2021 he read presented to [facility] ER at [place] with shortness of breath that was waking him up from sleep at night and similar pleuritic-like chest pain.  D-dimer was negative.  White blood count was 16,800, but he was on prednisone.  Glucose had increased to 127.  COVID test was negative.  Troponins were negative.  BNP was normal at 63.4.  His blood pressure has been elevated.  He continues to experience some twinges of  chest pain when he takes a deep breath but actually the symptoms have somewhat improved since initiation of colchicine and he has continued to be on indomethacin 50 mg twice a day.  He was sleeping poorly.   During that evaluation his ECG showed sinus bradycardia 53 bpm.  He had normal PR segment.  On exam he did not have a friction rub.  His blood pressure was elevated and I felt this may have been also contributed by his rem related untreated sleep apnea.  I recommended sodium restriction and added amlodipine 5 mg for improved blood pressure control as well as spironolactone 12.5 mg daily.  I scheduled him for 2D echo Doppler study.  I recommended he continue nonsteroidal anti-inflammatory medicine and colchicine.  I discussed his untreated sleep apnea at length.  He was not a candidate for inspire with his BMI of 39.68.  We discussed reevaluation versus a dental evaluation for a customized oral appliance.  I also recommended repeat laboratory with a comprehensive metabolic panel, CBC, erythrocyte sedimentation rate, and CRP.  I saw him in follow-up on October 07, 2021.  He felt better but still was fatigued.   His echo Doppler study on September 02, 2021 showed an EF of 55 to 60% with normal wall motion.  There was mild biatrial enlargement.  Pericardium was normal without evidence for effusion.  Laboratory revealed a CRP of 2, normal chemistry and sed rate of 7.  CBC was normal with white blood count of 16.8 which had improved from November 22 when it was 16.8.  He feels better.    Over the last several months, he had felt well but 3 weeks ago he had an upper respiratory tract infection.  This was associated with some chest pain in his chest and across his back which seem more prominent when laying down.  He again developed some lethargy and felt fatigued.  He denies any current cough.  He has continued to be on colchicine 0.6 mg twice a day he resumed taking spironolactone 12.5 mg for the last  week.  He  continues to be on metoprolol succinate 50 mg daily.  He is on amlodipine 5 mg.  He was taking ibuprofen 400 mg.  He presents for reevaluation.  Past Medical History:  Diagnosis Date   Arthritis of both knees    Back pain    Chest pain    Constipation    Depression    Food allergy    coconut   Hypertension    Joint pain    Low back pain    herniated disc   Lumbar herniated disc    Obesity    OSA (obstructive sleep apnea)    has not yet received CPAP   Other fatigue    Pericarditis    in his 92s   Prediabetes    Shortness of breath    Shortness of breath on exertion     Past Surgical History:  Procedure Laterality Date   KNEE SURGERY Right 2016    Current Medications: Outpatient Medications Prior to Visit  Medication Sig Dispense Refill   allopurinol (ZYLOPRIM) 100 MG tablet Take 100 mg by mouth daily.     amLODipine (NORVASC) 5 MG tablet Take 5 mg by mouth daily.     colchicine 0.6 MG tablet Take 0.6 mg by mouth 2 (two) times daily.     cyanocobalamin 1000 MCG tablet Take 1 tablet by mouth daily.     ferrous sulfate 325 (65 FE) MG tablet Take 1 tablet by mouth daily.     ibuprofen (ADVIL) 600 MG tablet Take 600 mg by mouth in the morning and at bedtime.     pantoprazole (PROTONIX) 40 MG tablet Take 1 tablet (40 mg total) by mouth daily. 90 tablet 1   QUEtiapine (SEROQUEL) 25 MG tablet Take 25 mg by mouth at bedtime.     metoprolol succinate (TOPROL-XL) 50 MG 24 hr tablet Take 50 mg by mouth daily.     cyclobenzaprine (FLEXERIL) 5 MG tablet Take 1 tablet (5 mg total) by mouth 3 (three) times daily as needed. (Patient not taking: Reported on 02/16/2022) 40 tablet 1   Vitamin D, Ergocalciferol, (DRISDOL) 1.25 MG (50000 UNIT) CAPS capsule Take 1 capsule (50,000 Units total) by mouth every 7 (seven) days. (Patient not taking: Reported on 02/16/2022) 4 capsule 0   No facility-administered medications prior to visit.     Allergies:   Coconut (cocos nucifera)   Social  History   Socioeconomic History   Marital status: Single    Spouse name: Not on file   Number of children: 1   Years of education: Not on file   Highest education level: Not on file  Occupational History   Occupation: Psychiatric nurse    Employer: [employer]: [employer]. Designer, industrial/product, Instructor, Financial risk analyst)  Tobacco Use   Smoking status: Never   Smokeless tobacco: Never  Vaping Use   Vaping Use: Never used  Substance and Sexual Activity   Alcohol use: No   Drug use: No   Sexual activity: Not Currently  Other Topics Concern   Not on file  Social History Narrative   Not on file   Social Determinants of Health   Financial Resource Strain: Not on file  Food Insecurity: Not on file  Transportation Needs: Not on file  Physical Activity: Not on file  Stress: Not on file  Social Connections: Not on file    Socially he is single.  He was born in [place] with  early high school.  He has a BS degree.  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  He has a child.  Family History:  The patient's family history includes Alcoholism in his father and another family member; Depression in his mother and another family member; Diabetes in his brother, maternal grandmother, mother, and another family member; Heart disease in his father; High Cholesterol in his maternal grandmother; Hyperlipidemia in his father and mother; Hypertension in his father, mother, and another family member; Obesity in his mother and another family member; Sleep apnea in his mother and another family member; Stroke in his maternal grandfather and maternal grandmother.     ROS General: Negative; No fevers, chills, or night sweats;  HEENT: Negative; No changes in vision or hearing, sinus congestion, difficulty swallowing Pulmonary: Significant improvement in prior  pleuritic chest pain Cardiovascular: See HPI GI: Negative; No nausea, vomiting, diarrhea, or abdominal pain GU: Negative; No dysuria, hematuria, or difficulty voiding Musculoskeletal: Negative; no myalgias, joint pain, or weakness Hematologic/Oncology: Negative; no easy bruising, bleeding Endocrine: Negative; no heat/cold intolerance; no diabetes Neuro: Negative; no changes in balance, headaches Skin: Negative; No rashes or skin lesions Psychiatric: Negative; No behavioral problems, depression Sleep: OSA, noncompliant with CPAP and as result is CPAP machine was picked up by his DME company;  history of snoring, daytime sleepiness, hypersomnolence Other comprehensive 14 point system review is negative.   PHYSICAL EXAM:   VS:  BP 115/78   Pulse (!) 56   Ht _0  (1.93 m)   Wt (!) 320 lb 6.4 oz (145.3 kg)   SpO2 95%   BMI 39.00 kg/m     Repeat blood pressure by me 124/82.  Wt Readings from Last 3 Encounters:  02/16/22 (!) 320 lb 6.4 oz (145.3 kg)  12/21/21 (!) 316 lb (143.3 kg)  12/07/21 (!) 313 lb (142 kg)    General: Alert, oriented, no distress.  Skin: normal turgor, no rashes, warm and dry HEENT: Normocephalic, atraumatic. Pupils equal round and reactive to light; sclera anicteric; extraocular muscles intact; Nose without nasal septal hypertrophy Mouth/Parynx benign; Mallinpatti scale 3 Neck: No JVD, no carotid bruits; normal carotid upstroke Lungs: clear to ausculatation and percussion; no wheezing or rales Chest wall: Possible minimal left costochondral tenderness Heart: PMI not displaced, RRR, s1 s2 normal, 1/6 systolic murmur, no diastolic murmur, gallops, thrills, or heaves; no pericardial friction rub Abdomen: soft, nontender; no hepatosplenomehaly, BS+; abdominal aorta nontender and not dilated by palpation. Back: no CVA tenderness Pulses 2+ Musculoskeletal: full range of motion, normal strength, no joint deformities Extremities: no clubbing cyanosis or edema,  Homan's sign negative  Neurologic: grossly nonfocal; Cranial nerves grossly wnl Psychologic: Normal mood and affect    Studies/Labs Reviewed:    Feb 16, 2022 ECG (independently read by me): Sinus bradycardia 56 bpm.  Normal PR segment.  No significant ST changes.  October 07, 2021 ECG (independently read by me): NSR at 66; PR 182 msec; QTc 429 msec  August 25, 2021 ECG (independently read by me):  Sinus bradycardia at 53, normal PR segment   May 16,2022 ECG (independently read by me): Normal sinus rhythm at 72 bpm.  Previously noted T wave abnormality in lead III and aVF.  January 28, 2021 ECG (independently read by me): NSR at 63; LVH Q III T wave abnormality III, aVF  December 04, 2020 ECG (  independently read by me): NSR at 71; mild early repolarization changes; QTc 430  I reviewed his recent ECGs from his emergency room and hospitalizations.  Early repolarization changes had evolved since his October 28, 2020 ECG.  Recent Labs:    Latest Ref Rng & Units 02/16/2022   12:45 PM 12/07/2021    8:50 AM 09/29/2021   11:13 AM  BMP  Glucose 70 - 99 mg/dL 85   90   104    BUN 6 - 24 mg/dL _0 Creatinine 0.76 - 1.27 mg/dL 1.01   1.00   1.08    BUN/Creat Ratio 9 - _1 Sodium 134 - 144 mmol/L 143   143   142    Potassium 3.5 - 5.2 mmol/L 4.3   4.4   4.4    Chloride 96 - 106 mmol/L 106   102   105    CO2 20 - 29 mmol/L _2 Calcium 8.7 - 10.2 mg/dL 9.7   9.3   9.4          Latest Ref Rng & Units 02/16/2022   12:45 PM 12/07/2021    8:50 AM 09/29/2021   11:13 AM  Hepatic Function  Total Protein 6.0 - 8.5 g/dL 7.3   7.4   6.8    Albumin 4.0 - 5.0 g/dL 4.5   4.5   4.2    AST 0 - 40 IU/L 45   33   39    ALT 0 - 44 IU/L 71   53   42    Alk Phosphatase 44 - 121 IU/L 77   87   82    Total Bilirubin 0.0 - 1.2 mg/dL 0.7   0.7   0.4         Latest Ref Rng & Units 02/16/2022   12:45 PM 12/07/2021    8:50 AM 09/29/2021   11:13 AM  CBC  WBC 3.4 - 10.8  x10E3/uL 5.7   8.1   6.8    Hemoglobin 13.0 - 17.7 g/dL 14.5   14.6   13.9    Hematocrit 37.5 - 51.0 % 43.0   44.3   42.4    Platelets 150 - 450 x10E3/uL 212   202   236     Lab Results  Component Value Date   MCV 89 02/16/2022   MCV 92 12/07/2021   MCV 88 09/29/2021   Lab Results  Component Value Date   TSH 1.700 12/07/2021   Lab Results  Component Value Date   HGBA1C 6.0 (H) 12/07/2021     BNP    Component Value Date/Time   BNP 63.4 08/24/2021 2030    ProBNP No results found for: PROBNP   Lipid Panel     Component Value Date/Time   CHOL 152 12/07/2021 0850   TRIG 82 12/07/2021 0850   HDL 62 12/07/2021 0850   LDLCALC 74 12/07/2021 0850   LABVLDL 16 12/07/2021 0850     RADIOLOGY: No results found.   Additional studies/ records that were reviewed today include:  I reviewed his prior office notes, January 2022 hospital records, ECGs, and subsequent evaluation with Doreene Adas, PA   ASSESSMENT:    1. History of pericarditis   2. Essential hypertension   3. Precordial pain   4. Chest pain, unspecified type   5. Obstructive  sleep apnea (adult) (pediatric)   6. Vitamin D insufficiency     PLAN:  Mr. KEMPER HEUPEL is a 41 year old gentleman who has a history of hypertension, obstructive sleep apnea, Covid 19 infection on October 02, 2020 with subsequent sequelae of fever, chills, headache, myalgias, and pneumonia.  Spinal tap was negative.  He was felt to have possible pericarditis and was treated with colchicine and ibuprofen and had taken Relafen without much benefit compared to previous ibuprofen.  When I saw him on December 04, 2020 I recommended he resume ibuprofen with his recurrent symptomatology.  A PA and lateral chest x-ray was without acute disease.  I also scheduled him for cardiac MRI which essentially was normal.  Subsequent laboratory also showed significantly improved erythrocyte sedimentation rate and C-reactive protein was normal.  He has developed  recurrent episodes of knifelike chest discomfort leading to reevaluation in May.  In October 2022 he again experienced recurrent symptomatology for which she had seen Dr. Davina Poke and subsequently Nathaniel Deforest, PA.  C-reactive protein had elevated to 19 and he was started on indomethacin.  He was started back on colchicine when seen by Nathaniel Deforest, PA on August 03, 2021.  He has continued to experience recurrent symptomatology.  When I saw him in late November 2022, he had stage II hypertension and I started him on amlodipine 5 mg in addition to spironolactone.  A subsequent 2D echo Doppler study showed normal systolic function.  Pericardium was normal.  There was mild atrial dilatation.  He has been on ibuprofen most recently at 600 mg every 8 hours in addition to colchicine 0.6 mg twice daily as well as amlodipine 5 mg, metoprolol succinate 50 mg daily.  With this therapy, his inflammation has essentially resolved.  His CRP is 2 and sedimentation rate 7.  At his last office visit in January 2026 3 his blood pressure was stable and I recommended reduction of ibuprofen to 400 mg every 8 hours for 2 days and then reduce this to 200 mg every 8 hours with subsequent as needed.  He will continue colchicine for another 3 months.  He will undergo dental evaluation for customized oral appliance and apparently was to see Dr. Ron Parker for initial evaluation.  He developed URI symptoms approximately 3 weeks ago but denied any associated fever but admitted to increasing cough.  His chest pain resumed across his chest and back associated with coughing and lying flat and had experienced more lethargy.  His blood pressure today remained stable.  I will recheck a comprehensive metabolic panel, CBC, erythrocyte sedimentation rate and CRP.  He has some chest wall tenderness along the left costochondral region and I have recommended resumption of ibuprofen 40 mg 3 times a day with reduction as tolerated.  He apparently had taken vitamin D  50,000 units for 4 weeks but his vitamin D level in March 2023 was 25.7.  His ECG remained stable.  I will contact him regarding his laboratory.  He will continue colchicine at present as well as his amlodipine and metoprolol. He also is back on spironolactone 12.5 mg which she reinitiated over the past week.  I will see him in several months for reevaluation and further recommendations were made at that time.  Medication Adjustments/Labs and Tests Ordered: Current medicines are reviewed at length with the patient today.  Concerns regarding medicines are outlined above.  Medication changes, Labs and Tests ordered today are listed in the Patient Instructions below. Patient Instructions  Medication Instructions:  You  may take ibuprofen 400 mg three times daily as needed.  *If you need a refill on your cardiac medications before your next appointment, please call your pharmacy*   Lab Work: CMET, CBC, Sed rate, CRP today   If you have labs (blood work) drawn today and your tests are completely normal, you will receive your results only by: [place] (if you have MyChart) OR A paper copy in the mail If you have any lab test that is abnormal or we need to change your treatment, we will call you to review the results.   Testing/Procedures: Echocardiogram - Your physician has requested that you have an echocardiogram. Echocardiography is a painless test that uses sound waves to create images of your heart. It provides your doctor with information about the size and shape of your heart and how well your heart's chambers and valves are working. This procedure takes approximately one hour. There are no restrictions for this procedure.     Follow-Up: At [facility], you and your health needs are our priority.  As part of our continuing mission to provide you with exceptional heart care, we have created designated Provider Care Teams.  These Care Teams include your primary Cardiologist  (physician) and Advanced Practice Providers (APPs -  Physician Assistants and Nurse Practitioners) who all work together to provide you with the care you need, when you need it.  We recommend signing up for the patient portal called "MyChart".  Sign up information is provided on this After Visit Summary.  MyChart is used to connect with patients for Virtual Visits (Telemedicine).  Patients are able to view lab/test results, encounter notes, upcoming appointments, etc.  Non-urgent messages can be sent to your provider as well.   To learn more about what you can do with MyChart, go to [email or web address].    Your next appointment:   6 month(s)  The format for your next appointment:   In Person  Provider:   Shelva Majestic, MD             Signed, Shelva Majestic, MD  02/23/2022 6:31 AM    [place] [facility] [address], [place], [place], [place]  [address] Phone: [phone]

## 2022-02-17 LAB — COMPREHENSIVE METABOLIC PANEL
ALT: 71 IU/L — ABNORMAL HIGH (ref 0–44)
AST: 45 IU/L — ABNORMAL HIGH (ref 0–40)
Albumin/Globulin Ratio: 1.6 (ref 1.2–2.2)
Albumin: 4.5 g/dL (ref 4.0–5.0)
Alkaline Phosphatase: 77 IU/L (ref 44–121)
BUN/Creatinine Ratio: 11 (ref 9–20)
BUN: 11 mg/dL (ref 6–24)
Bilirubin Total: 0.7 mg/dL (ref 0.0–1.2)
CO2: 24 mmol/L (ref 20–29)
Calcium: 9.7 mg/dL (ref 8.7–10.2)
Chloride: 106 mmol/L (ref 96–106)
Creatinine, Ser: 1.01 mg/dL (ref 0.76–1.27)
Globulin, Total: 2.8 g/dL (ref 1.5–4.5)
Glucose: 85 mg/dL (ref 70–99)
Potassium: 4.3 mmol/L (ref 3.5–5.2)
Sodium: 143 mmol/L (ref 134–144)
Total Protein: 7.3 g/dL (ref 6.0–8.5)
eGFR: 96 mL/min/{1.73_m2} (ref >59)

## 2022-02-17 LAB — CBC
Hematocrit: 43 % (ref 37.5–51.0)
Hemoglobin: 14.5 g/dL (ref 13.0–17.7)
MCH: 30.1 pg (ref 26.6–33.0)
MCHC: 33.7 g/dL (ref 31.5–35.7)
MCV: 89 fL (ref 79–97)
Platelets: 212 10*3/uL (ref 150–450)
RBC: 4.82 x10E6/uL (ref 4.14–5.80)
RDW: 13.1 % (ref 11.6–15.4)
WBC: 5.7 10*3/uL (ref 3.4–10.8)

## 2022-02-17 LAB — C-REACTIVE PROTEIN: CRP: 2 mg/L (ref 0–10)

## 2022-02-17 LAB — SEDIMENTATION RATE: Sed Rate: 17 mm/hr — ABNORMAL HIGH (ref 0–15)

## 2022-02-19 ENCOUNTER — Other Ambulatory Visit: Payer: Self-pay | Admitting: Cardiovascular Disease

## 2022-02-21 ENCOUNTER — Telehealth: Payer: Self-pay | Admitting: Cardiovascular Disease

## 2022-02-21 NOTE — Telephone Encounter (Signed)
Patient wanted results from recent blood work. Gave him Dr. Evette Georges notes..."LFTs mildly increased; ESR slightly increased to 17; CRP normal at 2; CBC stable." Patient wanted to understand why he keeps having flare-ups of gout and how this is affecting his heart. He also wanted to know if there are other medications he can take to keep inflammation down. Please advise.

## 2022-02-21 NOTE — Telephone Encounter (Signed)
Patient called and mentioned that he would like for someone to explain his results to him. Please call back to explain

## 2022-02-23 ENCOUNTER — Encounter: Payer: Self-pay | Admitting: Cardiovascular Disease

## 2022-02-24 ENCOUNTER — Ambulatory Visit: Payer: Self-pay

## 2022-02-24 ENCOUNTER — Ambulatory Visit: Payer: 59 | Admitting: Physician Assistant

## 2022-02-24 ENCOUNTER — Encounter: Payer: Self-pay | Admitting: Physician Assistant

## 2022-02-24 ENCOUNTER — Ambulatory Visit (INDEPENDENT_AMBULATORY_CARE_PROVIDER_SITE_OTHER): Payer: 59

## 2022-02-24 DIAGNOSIS — M1712 Unilateral primary osteoarthritis, left knee: Secondary | ICD-10-CM

## 2022-02-24 DIAGNOSIS — M1711 Unilateral primary osteoarthritis, right knee: Secondary | ICD-10-CM

## 2022-02-24 MED ORDER — METHYLPREDNISOLONE ACETATE 40 MG/ML IJ SUSP
40.0000 mg | INTRAMUSCULAR | Status: AC | PRN
  Administered 2022-02-24: 40 mg via INTRA_ARTICULAR

## 2022-02-24 MED ORDER — LIDOCAINE HCL 1 % IJ SOLN
3.0000 mL | INTRAMUSCULAR | Status: AC | PRN
  Administered 2022-02-24: 3 mL

## 2022-02-24 NOTE — Progress Notes (Signed)
Office Visit Note   Patient: Nathaniel Jones           Date of Birth: 05/26/1981           MRN: [id number] Visit Date: 02/24/2022              Requested by: [name], [name], MD [address] [place],  [place] [address] PCP: [name], [name], MD   Assessment & Plan: Visit Diagnoses:  1. Unilateral primary osteoarthritis, left knee   2. Unilateral primary osteoarthritis, right knee     Plan: Recommend he continue to work on quad strengthening.  He can have repeat cortisone injections in the knees every 3 months.  He has failed supplemental injections in the past.  He will follow-up with [place] as needed.  Follow-Up Instructions: Return if symptoms worsen or fail to improve.   Orders:  Orders Placed This Encounter  Procedures   Large Joint Inj   XR Knee 1-2 Views Right   XR Knee 1-2 Views Left   No orders of the defined types were placed in this encounter.     Procedures: Large Joint Inj: bilateral knee on 02/24/2022 4:33 PM Indications: pain Details: 22 G 1.5 in needle, anterolateral approach  Arthrogram: No  Medications (Right): 3 mL lidocaine 1 %; 40 mg methylPREDNISolone acetate 40 MG/ML Medications (Left): 3 mL lidocaine 1 %; 40 mg methylPREDNISolone acetate 40 MG/ML Outcome: tolerated well, no immediate complications Procedure, treatment alternatives, risks and benefits explained, specific risks discussed. Consent was given by the patient. Immediately prior to procedure a time out was called to verify the correct patient, procedure, equipment, support staff and site/side marked as required. Patient was prepped and draped in the usual sterile fashion.      Clinical Data: No additional findings.   Subjective: Chief Complaint  Patient presents with   Right Knee - Pain   Left Knee - Pain    HPI Nathaniel Jones is a 41 year old male well-known to Dr. Raye Sorrow service.  Comes in today with bilateral knee pain right greater than left.  States right knee feels weak and  buckles.  He has had no new injuries or falls.  He states he is trying to become more active and his knees are bothering him more more.  He has known tricompartmental arthritis of the right knee by MRI.  Denies any fevers chills.  Still being treated for pericarditis he states he is talked to his cardiologist about having cortisone injections.  Feels great.  She is still unsure if pericarditis was caused by the COVID virus or the vaccination for COVID.  Review of Systems See HPI  Objective: Vital Signs: There were no vitals taken for this visit.  Physical Exam Constitutional:      Appearance: He is not ill-appearing or diaphoretic.  Pulmonary:     Effort: Pulmonary effort is normal.  Neurological:     Mental Status: He is alert and oriented to person, place, and time.  Psychiatric:        Mood and Affect: Mood normal.    Ortho Exam Bilateral knees good range of motion.  Significant patellofemoral crepitus both knees.  No instability valgus varus stressing of either knee.  No abnormal warmth erythema or effusion of either knee.  Specialty Comments:  No specialty comments available.  Imaging: XR Knee 1-2 Views Left  Result Date: 02/24/2022 Left knee 2 views: Knee is well located.  No acute fractures or findings.  Varus deformity with near bone-on-bone medial  compartment.  Mild lateral compartmental changes and moderate to severe patellofemoral changes.  XR Knee 1-2 Views Right  Result Date: 02/24/2022 Right knee 2 views: Knee is well located.  No acute findings or fracture.  Varus deformity.  Near bone-on-bone medial compartment.  Mild lateral compartmental changes with severe patellofemoral changes.    PMFS History: Patient Active Problem List   Diagnosis Date Noted   CAP (community acquired pneumonia) 10/31/2020   Pericarditis, viral 10/31/2020   Essential hypertension 10/31/2020   OSA (obstructive sleep apnea) 10/31/2020   Sleep disturbance 10/15/2020   Rectal bleeding  10/15/2020   Primary generalized (osteo)arthritis 10/15/2020   Prediabetes 10/15/2020   Obesity 10/15/2020   Neck pain 10/15/2020   Muscle contraction headache 10/15/2020   Decreased testosterone level 10/15/2020   Suspected COVID-19 virus infection 12/24/2019   Unilateral primary osteoarthritis, left knee 05/17/2018   Unilateral primary osteoarthritis, right knee 05/17/2018   Chronic pain of both knees 05/17/2018   Biceps rupture, proximal, right, initial encounter 05/22/2017   Chronic bilateral low back pain without sciatica 03/08/2017   Lateral epicondylitis, right elbow 03/08/2017   It band syndrome, left 03/08/2017   Past Medical History:  Diagnosis Date   Arthritis of both knees    Back pain    Chest pain    Constipation    Depression    Food allergy    coconut   Hypertension    Joint pain    Low back pain    herniated disc   Lumbar herniated disc    Obesity    OSA (obstructive sleep apnea)    has not yet received CPAP   Other fatigue    Pericarditis    in his 60s   Prediabetes    Shortness of breath    Shortness of breath on exertion     Family History  Problem Relation Age of Onset   Obesity Mother    Sleep apnea Mother    Depression Mother    Hyperlipidemia Mother    Hypertension Mother    Diabetes Mother    Alcoholism Father    Hyperlipidemia Father    Hypertension Father    Heart disease Father        Unclear details   Diabetes Brother    Stroke Maternal Grandmother    Diabetes Maternal Grandmother    High Cholesterol Maternal Grandmother    Stroke Maternal Grandfather    Hypertension Other    Diabetes Other    Depression Other    Sleep apnea Other    Alcoholism Other    Obesity Other    Autoimmune disease Neg Hx     Past Surgical History:  Procedure Laterality Date   KNEE SURGERY Right 2016   Social History   Occupational History   Occupation: Nurse, adult    Employer: [employer]    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

## 2022-03-10 ENCOUNTER — Ambulatory Visit (HOSPITAL_COMMUNITY): Payer: 59 | Attending: Cardiology

## 2022-03-10 DIAGNOSIS — I319 Disease of pericardium, unspecified: Secondary | ICD-10-CM

## 2022-03-10 LAB — ECHOCARDIOGRAM COMPLETE
Area-P 1/2: 3.4 cm2
S' Lateral: 2.8 cm

## 2022-03-22 ENCOUNTER — Telehealth: Payer: Self-pay | Admitting: Cardiovascular Disease

## 2022-03-22 NOTE — Telephone Encounter (Signed)
Normal LV function with EF 60 to 65%.  Grade 2 diastolic dysfunction with mild LVH.  Moderate LA dilatation.  Mild MR.  No evidence for pericardial effusion  Echocardiogram result explained in detail Verbalized understanding

## 2022-03-22 NOTE — Telephone Encounter (Signed)
Pt is requesting call back in regards to his echo results. Please advise.

## 2022-03-25 NOTE — Telephone Encounter (Signed)
Patient had recent ECHO, was discussed with him.

## 2022-04-12 IMAGING — DX DG CHEST 1V PORT
1 series · 2 of 2 positions shown · non-contrast
Comparison: 01/15/2021

CLINICAL DATA: Chest pain and shortness of breath

EXAM:
PORTABLE CHEST 1 VIEW

[Series 1: chest · 0.14mm/px · 2 of 2 slices shown]
[im 1/2]
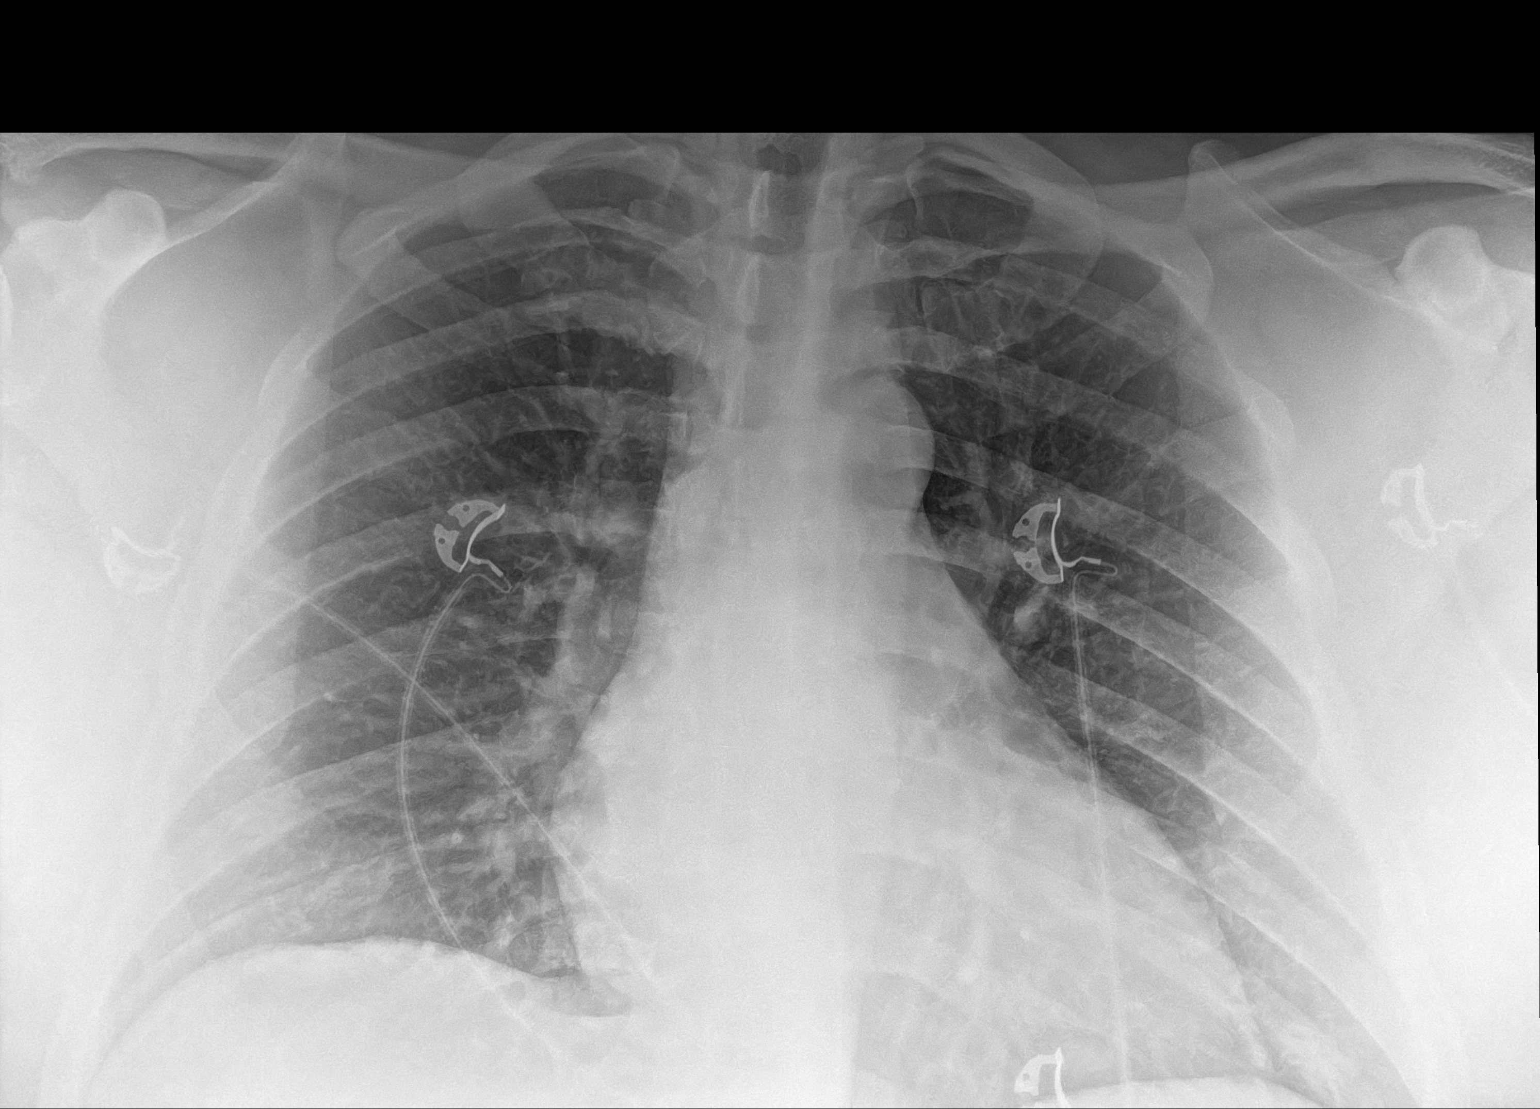
[im 2/2]
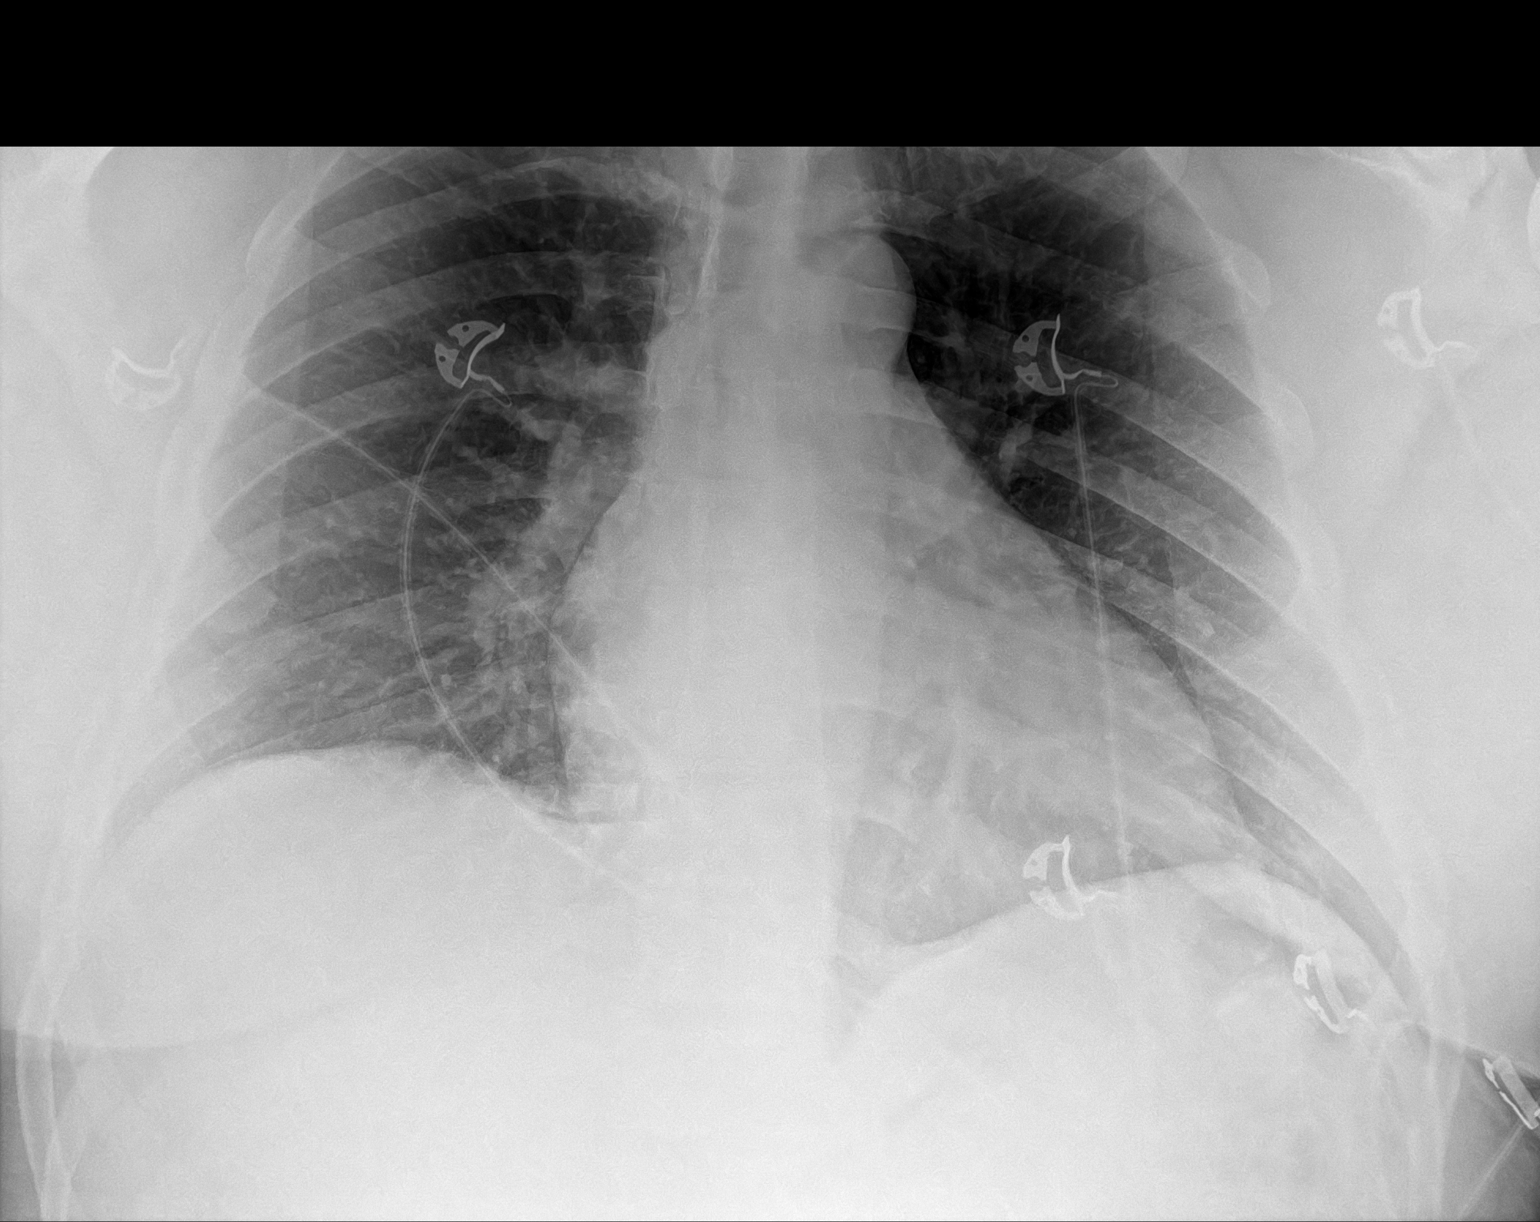

[2 of 2 positions shown; findings below may reference images not displayed]

FINDINGS: Cardiac and mediastinal contours are within normal limits when
accounting for differences in technique. No focal pulmonary opacity.
No pleural effusion or pneumothorax. No acute osseous abnormality.
IMPRESSION: No acute cardiopulmonary process.

## 2022-04-13 ENCOUNTER — Telehealth: Payer: Self-pay | Admitting: Cardiovascular Disease

## 2022-04-13 NOTE — Telephone Encounter (Signed)
 *  STAT* If patient is at the pharmacy, call can be transferred to refill team.   1. Which medications need to be refilled? (please list name of each medication and dose if known)   colchicine 0.6 MG tablet    2. Which pharmacy/location (including street and city if local pharmacy) is medication to be sent to? [id number] - [place], [place] - [address]  3. Do they need a 30 day or 90 day supply? 30 days

## 2022-04-14 ENCOUNTER — Other Ambulatory Visit: Payer: Self-pay | Admitting: Cardiovascular Disease

## 2022-04-15 ENCOUNTER — Other Ambulatory Visit: Payer: Self-pay

## 2022-04-15 DIAGNOSIS — M109 Gout, unspecified: Secondary | ICD-10-CM | POA: Insufficient documentation

## 2022-04-15 MED ORDER — COLCHICINE 0.6 MG PO TABS: 0.6000 mg | ORAL_TABLET | Freq: Two times a day (BID) | ORAL | 1 refills | Status: DC

## 2022-04-15 NOTE — Telephone Encounter (Signed)
Patient is out of medication

## 2022-05-06 ENCOUNTER — Other Ambulatory Visit: Payer: Self-pay | Admitting: Cardiovascular Disease

## 2022-05-06 DIAGNOSIS — I319 Disease of pericardium, unspecified: Secondary | ICD-10-CM

## 2022-05-11 ENCOUNTER — Encounter (INDEPENDENT_AMBULATORY_CARE_PROVIDER_SITE_OTHER): Payer: Self-pay

## 2022-08-09 ENCOUNTER — Ambulatory Visit: Payer: 59 | Admitting: Cardiovascular Disease

## 2022-08-10 ENCOUNTER — Other Ambulatory Visit: Payer: Self-pay | Admitting: Cardiovascular Disease

## 2022-08-22 ENCOUNTER — Ambulatory Visit (INDEPENDENT_AMBULATORY_CARE_PROVIDER_SITE_OTHER): Payer: 59

## 2022-08-22 ENCOUNTER — Ambulatory Visit: Payer: 59 | Admitting: Physician Assistant

## 2022-08-22 ENCOUNTER — Encounter: Payer: Self-pay | Admitting: Physician Assistant

## 2022-08-22 DIAGNOSIS — M1712 Unilateral primary osteoarthritis, left knee: Secondary | ICD-10-CM

## 2022-08-22 DIAGNOSIS — M25512 Pain in left shoulder: Secondary | ICD-10-CM

## 2022-08-22 DIAGNOSIS — G8929 Other chronic pain: Secondary | ICD-10-CM | POA: Diagnosis not present

## 2022-08-22 MED ORDER — METHYLPREDNISOLONE ACETATE 40 MG/ML IJ SUSP
40.0000 mg | INTRAMUSCULAR | Status: AC | PRN
  Administered 2022-08-22: 40 mg via INTRA_ARTICULAR

## 2022-08-22 MED ORDER — LIDOCAINE HCL 1 % IJ SOLN
3.0000 mL | INTRAMUSCULAR | Status: AC | PRN
  Administered 2022-08-22: 3 mL

## 2022-08-22 NOTE — Progress Notes (Signed)
Office Visit Note   Patient: Nathaniel Jones           Date of Birth: [birth year]           MRN: [id number] Visit Date: 08/22/2022              Requested by: [name], [name], MD [address] [place],  [place] [address] PCP: [name], [name], MD   Assessment & Plan: Visit Diagnoses:  1. Chronic left shoulder pain   2. Unilateral primary osteoarthritis, left knee     Plan:  See him back in just 2 weeks to see how he is doing in regards to that shoulder and the left knee.  May consider right knee injection at that time if still bothersome.  In regards to his shoulder he still avoid bench press, military press, push-ups and pull-ups.  Questions were encouraged and answered  Follow-Up Instructions: Return in about 2 weeks (around 09/05/2022).   Orders:  Orders Placed This Encounter  Procedures   Large Joint Inj   Large Joint Inj   XR Shoulder Left   No orders of the defined types were placed in this encounter.     Procedures: Large Joint Inj: L subacromial bursa on 08/22/2022 4:46 PM Indications: pain Details: 22 G 1.5 in needle, lateral approach  Arthrogram: No  Medications: 3 mL lidocaine 1 %; 40 mg methylPREDNISolone acetate 40 MG/ML Outcome: tolerated well, no immediate complications Procedure, treatment alternatives, risks and benefits explained, specific risks discussed. Consent was given by the patient. Immediately prior to procedure a time out was called to verify the correct patient, procedure, equipment, support staff and site/side marked as required. Patient was prepped and draped in the usual sterile fashion.    Large Joint Inj on 08/22/2022 4:46 PM Indications: pain Details: 22 G 1.5 in needle, anterolateral approach  Arthrogram: No  Medications: 3 mL lidocaine 1 %; 40 mg methylPREDNISolone acetate 40 MG/ML Outcome: tolerated well, no immediate complications Procedure, treatment alternatives, risks and benefits explained, specific risks  discussed. Consent was given by the patient. Immediately prior to procedure a time out was called to verify the correct patient, procedure, equipment, support staff and site/side marked as required. Patient was prepped and draped in the usual sterile fashion.       Clinical Data: No additional findings.   Subjective: Chief Complaint  Patient presents with   Left Shoulder - Pain    HPI Nathaniel Jones comes in today due to left shoulder pain.  He states he has had left shoulder pain for the past 2 months after working out doing a lot of push-ups.  He has had no new injury to the shoulder.  Denies any numbness tingling down the arm.  He has tried Advil Tylenol with no real relief.  He states push-ups and adduction left arm across the chest cause pain. He is also having bilateral knee pain knees have been bothering for the past 3 weeks after long drive back from [place].  He states currently the left knee is worse than the right.  No new injury to either knee.  He does have known osteoarthritis of both knees.  Feels like both knees give way at times. Review of Systems Denies any fevers chills.  Nondiabetic  Objective: Vital Signs: There were no vitals taken for this visit.  Physical Exam Constitutional:      Appearance: He is not ill-appearing or diaphoretic.  Pulmonary:     Effort: Pulmonary effort is normal.  Neurological:     Mental Status: He is alert and oriented to person, place, and time.  Psychiatric:        Mood and Affect: Mood normal.     Ortho Exam Left knee: No abnormal warmth or erythema.  No effusion.  Good range of motion. Bilateral shoulders: 5 out of 5 strength external and internal rotation against resistance.  Weakness with empty can test on left.  Positive impingement on the left.  Liftoff test is negative bilaterally. Specialty Comments:  No specialty comments available.  Imaging: XR Shoulder Left  Result Date: 08/22/2022 Left shoulder 3 views: Shoulder is well  located no acute fractures.  Glenohumeral joint is well-maintained.  AC joint is difficult to assess secondary to positioning.    PMFS History: Patient Active Problem List   Diagnosis Date Noted   Gout 04/15/2022   Hyperglycemia 01/26/2022   CAP (community acquired pneumonia) 10/31/2020   Pericarditis, viral 10/31/2020   Essential hypertension 10/31/2020   OSA (obstructive sleep apnea) 10/31/2020   Sleep disturbance 10/15/2020   Rectal bleeding 10/15/2020   Primary generalized (osteo)arthritis 10/15/2020   Prediabetes 10/15/2020   Obesity 10/15/2020   Neck pain 10/15/2020   Muscle contraction headache 10/15/2020   Decreased testosterone level 10/15/2020   Suspected COVID-19 virus infection 12/24/2019   Unilateral primary osteoarthritis, left knee 05/17/2018   Unilateral primary osteoarthritis, right knee 05/17/2018   Chronic pain of both knees 05/17/2018   Biceps rupture, proximal, right, initial encounter 05/22/2017   Chronic bilateral low back pain without sciatica 03/08/2017   Lateral epicondylitis, right elbow 03/08/2017   It band syndrome, left 03/08/2017   Past Medical History:  Diagnosis Date   Arthritis of both knees    Back pain    Chest pain    Constipation    Depression    Food allergy    coconut   Hypertension    Joint pain    Low back pain    herniated disc   Lumbar herniated disc    Obesity    OSA (obstructive sleep apnea)    has not yet received CPAP   Other fatigue    Pericarditis    in his 72s   Prediabetes    Shortness of breath    Shortness of breath on exertion     Family History  Problem Relation Age of Onset   Obesity Mother    Sleep apnea Mother    Depression Mother    Hyperlipidemia Mother    Hypertension Mother    Diabetes Mother    Alcoholism Father    Hyperlipidemia Father    Hypertension Father    Heart disease Father        Unclear details   Diabetes Brother    Stroke Maternal Grandmother    Diabetes Maternal Grandmother     High Cholesterol Maternal Grandmother    Stroke Maternal Grandfather    Hypertension Other    Diabetes Other    Depression Other    Sleep apnea Other    Alcoholism Other    Obesity Other    Autoimmune disease Neg Hx     Past Surgical History:  Procedure Laterality Date   KNEE SURGERY Right 2016   Social History   Occupational History   Occupation: Psychiatric nurse    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

## 2022-09-05 ENCOUNTER — Other Ambulatory Visit: Payer: Self-pay | Admitting: Cardiovascular Disease

## 2022-09-05 ENCOUNTER — Ambulatory Visit: Payer: 59 | Admitting: Physician Assistant

## 2022-09-05 DIAGNOSIS — I10 Essential (primary) hypertension: Secondary | ICD-10-CM

## 2022-09-05 DIAGNOSIS — G4733 Obstructive sleep apnea (adult) (pediatric): Secondary | ICD-10-CM

## 2022-09-19 ENCOUNTER — Encounter: Payer: Self-pay | Admitting: Physician Assistant

## 2022-09-19 ENCOUNTER — Ambulatory Visit (INDEPENDENT_AMBULATORY_CARE_PROVIDER_SITE_OTHER): Payer: 59 | Admitting: Physician Assistant

## 2022-09-19 DIAGNOSIS — M25512 Pain in left shoulder: Secondary | ICD-10-CM | POA: Diagnosis not present

## 2022-09-19 DIAGNOSIS — M1711 Unilateral primary osteoarthritis, right knee: Secondary | ICD-10-CM | POA: Diagnosis not present

## 2022-09-19 NOTE — Progress Notes (Unsigned)
Office Visit Note   Patient: Nathaniel Jones           Date of Birth: 05-09-1981           MRN: [id number] Visit Date: 09/19/2022              Requested by: [name], [name], MD [address] [place],  [place] [address] PCP: [name], [name], MD   Assessment & Plan: Visit Diagnoses:  1. Acute pain of left shoulder   2. Unilateral primary osteoarthritis, right knee     Plan: Obtain an MRI of his left shoulder rule out rotator cuff tear due to failure of conservative treatment which included home exercises and a cortisone injection.  Also given his weakness on examination today.  Follow-Up Instructions: Return After MRI.   Orders:  No orders of the defined types were placed in this encounter.  No orders of the defined types were placed in this encounter.     Procedures: Large Joint Inj: R knee on 09/19/2022 9:24 PM Indications: pain Details: 22 G 1.5 in needle, anterolateral approach  Arthrogram: No  Medications: 3 mL lidocaine 1 %; 40 mg methylPREDNISolone acetate 40 MG/ML Outcome: tolerated well, no immediate complications Procedure, treatment alternatives, risks and benefits explained, specific risks discussed. Consent was given by the patient. Immediately prior to procedure a time out was called to verify the correct patient, procedure, equipment, support staff and site/side marked as required. Patient was prepped and draped in the usual sterile fashion.       Clinical Data: No additional findings.   Subjective: Chief Complaint  Patient presents with   Left Shoulder - Follow-up   Left Knee - Follow-up    HPI Reef comes in today for follow-up status post left shoulder injection left knee injection on 08/22/2022.  States the injection in his left knee helped.  He is asking for an injection in his right knee today.  He has known osteoarthritis both knees.  He continues to have pain in his left shoulder does not feel the injection really helped him.   He notes weakness in the shoulder and pain with overhead activity.  Again no known injury.  He did perform the exercises that were shown to him at home. Review of Systems  Constitutional:  Negative for chills and fever.     Objective: Vital Signs: There were no vitals taken for this visit.  Physical Exam Constitutional:      Appearance: He is not ill-appearing or diaphoretic.  Pulmonary:     Effort: Pulmonary effort is normal.  Neurological:     Mental Status: He is alert.    Ortho Exam Right knee: Good range of motion no abnormal warmth erythema.  Significant patellofemoral crepitus. Bilateral shoulders he has 5 out of 5 strength with internal against resistance.  Weakness left shoulder with external rotation against resistance.  Positive liftoff on the left and positive empty can test on the left. Specialty Comments:  No specialty comments available.  Imaging: No results found.   PMFS History: Patient Active Problem List   Diagnosis Date Noted   Gout 04/15/2022   Hyperglycemia 01/26/2022   CAP (community acquired pneumonia) 10/31/2020   Pericarditis, viral 10/31/2020   Essential hypertension 10/31/2020   OSA (obstructive sleep apnea) 10/31/2020   Sleep disturbance 10/15/2020   Rectal bleeding 10/15/2020   Primary generalized (osteo)arthritis 10/15/2020   Prediabetes 10/15/2020   Obesity 10/15/2020   Neck pain 10/15/2020   Muscle contraction headache 10/15/2020  Decreased testosterone level 10/15/2020   Suspected COVID-19 virus infection 12/24/2019   Unilateral primary osteoarthritis, left knee 05/17/2018   Unilateral primary osteoarthritis, right knee 05/17/2018   Chronic pain of both knees 05/17/2018   Biceps rupture, proximal, right, initial encounter 05/22/2017   Chronic bilateral low back pain without sciatica 03/08/2017   Lateral epicondylitis, right elbow 03/08/2017   It band syndrome, left 03/08/2017   Past Medical History:  Diagnosis Date   Arthritis  of both knees    Back pain    Chest pain    Constipation    Depression    Food allergy    coconut   Hypertension    Joint pain    Low back pain    herniated disc   Lumbar herniated disc    Obesity    OSA (obstructive sleep apnea)    has not yet received CPAP   Other fatigue    Pericarditis    in his 51s   Prediabetes    Shortness of breath    Shortness of breath on exertion     Family History  Problem Relation Age of Onset   Obesity Mother    Sleep apnea Mother    Depression Mother    Hyperlipidemia Mother    Hypertension Mother    Diabetes Mother    Alcoholism Father    Hyperlipidemia Father    Hypertension Father    Heart disease Father        Unclear details   Diabetes Brother    Stroke Maternal Grandmother    Diabetes Maternal Grandmother    High Cholesterol Maternal Grandmother    Stroke Maternal Grandfather    Hypertension Other    Diabetes Other    Depression Other    Sleep apnea Other    Alcoholism Other    Obesity Other    Autoimmune disease Neg Hx     Past Surgical History:  Procedure Laterality Date   KNEE SURGERY Right 2016   Social History   Occupational History   Occupation: Nurse, adult    Employer: [employer]    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

## 2022-09-20 MED ORDER — METHYLPREDNISOLONE ACETATE 40 MG/ML IJ SUSP
40.0000 mg | INTRAMUSCULAR | Status: AC | PRN
  Administered 2022-09-19: 40 mg via INTRA_ARTICULAR

## 2022-09-20 MED ORDER — LIDOCAINE HCL 1 % IJ SOLN
3.0000 mL | INTRAMUSCULAR | Status: AC | PRN
  Administered 2022-09-19: 3 mL

## 2022-09-21 NOTE — Addendum Note (Signed)
Addended by: Barbette Or on: 09/21/2022 09:18 AM   Modules accepted: Orders

## 2022-10-11 ENCOUNTER — Ambulatory Visit: Payer: 59 | Admitting: Cardiovascular Disease

## 2022-10-11 ENCOUNTER — Telehealth: Payer: Self-pay | Admitting: Cardiovascular Disease

## 2022-10-11 NOTE — Telephone Encounter (Signed)
Spoke to patient, appt rescheduled.  

## 2022-10-11 NOTE — Telephone Encounter (Signed)
Patient called to reschedule his appointment today. I informed him that Dr. Evette Georges next available is 5/06 and offered to schedule with an APP for a sooner appointment, but patient declined and questioned whether or not he still needs to be on his heart medication. He states his last appointment was cancelled by the provider--he states he went to the office for his appointment and was advised that MD was running 1 hour + behind and he would need to reschedule. Due to las encounter, patient feels as though it is unfair to wait until May to see MD. Is there anywhere patient can be worked in, in the near future? Please advise.

## 2022-10-18 ENCOUNTER — Ambulatory Visit
Admission: RE | Admit: 2022-10-18 | Discharge: 2022-10-18 | Disposition: A | Discharge status: Home / Self Care | Payer: 59 | Source: Ambulatory Visit | Attending: Physician Assistant | Admitting: Physician Assistant

## 2022-10-18 DIAGNOSIS — M25512 Pain in left shoulder: Secondary | ICD-10-CM

## 2022-10-20 ENCOUNTER — Ambulatory Visit: Payer: 59 | Admitting: Physician Assistant

## 2022-10-20 ENCOUNTER — Encounter: Payer: Self-pay | Admitting: Physician Assistant

## 2022-10-20 DIAGNOSIS — M25512 Pain in left shoulder: Secondary | ICD-10-CM

## 2022-10-20 NOTE — Progress Notes (Signed)
HPI: Nathaniel Jones returns today for follow-up of his left shoulder.  He states that his shoulder pain is slightly better.  He said no new injury.  He has been avoiding upper body workout due to the pain in the shoulder. MRI left shoulder was reviewed with the patient images reviewed.  MRI performed on 10/18/2022 showed mild to moderate glenohumeral arthritis.  Mild AC joint arthritis.  Os acromiale with mild fluid across the synchondrosis.  Tendinosis of the long head of biceps with a small mid substance longitudinal partial-thickness biceps tendon tear within the proximal aspect of the biceps groove.  Edema is seen in this area.  Partial insertional midsubstance tear involving the supraspinatus tendon at the footprint.  Review of systems: See HPI otherwise negative  Physical exam: General well-developed well-nourished male no acute distress. Left shoulder: Full range of motion.  5 of 5 strength external and internal rotation against resistance.  No tenderness along the muscle belly.  Tenderness over the proximal biceps up to the bicipital groove region.  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  He will follow-up with Dr. Ninfa Linden 2 weeks after the injection see how he is doing overall.  Questions were encouraged and answered at length.  Recommend he avoid any biceps curls, heavy grasping or lifting with the left arm.

## 2022-10-21 ENCOUNTER — Encounter: Payer: Self-pay | Admitting: Sports Medicine

## 2022-10-21 ENCOUNTER — Ambulatory Visit: Payer: Self-pay

## 2022-10-21 ENCOUNTER — Ambulatory Visit: Payer: 59 | Admitting: Sports Medicine

## 2022-10-21 DIAGNOSIS — G8929 Other chronic pain: Secondary | ICD-10-CM | POA: Diagnosis not present

## 2022-10-21 DIAGNOSIS — M25512 Pain in left shoulder: Secondary | ICD-10-CM | POA: Diagnosis not present

## 2022-10-21 MED ORDER — METHYLPREDNISOLONE ACETATE 40 MG/ML IJ SUSP
80.0000 mg | INTRAMUSCULAR | Status: AC | PRN
  Administered 2022-10-21: 80 mg via INTRA_ARTICULAR

## 2022-10-21 MED ORDER — LIDOCAINE HCL 1 % IJ SOLN
2.0000 mL | INTRAMUSCULAR | Status: AC | PRN
  Administered 2022-10-21: 2 mL

## 2022-10-21 MED ORDER — BUPIVACAINE HCL 0.25 % IJ SOLN
2.0000 mL | INTRAMUSCULAR | Status: AC | PRN
  Administered 2022-10-21: 2 mL via INTRA_ARTICULAR

## 2022-10-21 NOTE — Progress Notes (Signed)
   Procedure Note  Patient: Nathaniel Jones             Date of Birth: 1981/01/15           MRN: [id number]             Visit Date: 10/21/2022  Procedures: Visit Diagnoses:  1. Chronic left shoulder pain    Large Joint Inj: L glenohumeral on 10/21/2022 1:06 PM Indications: pain Details: 22 G 3.5 in needle, ultrasound-guided posterior approach Medications: 2 mL lidocaine 1 %; 2 mL bupivacaine 0.25 %; 80 mg methylPREDNISolone acetate 40 MG/ML Outcome: tolerated well, no immediate complications  US-guided glenohumeral joint injection, left shoulder After discussion on risks/benefits/indications, informed verbal consent was obtained. A timeout was then performed. The patient was positioned lying lateral recumbent on examination table. The patient's shoulder was prepped with betadine and multiple alcohol swabs and utilizing ultrasound guidance, the patient's glenohumeral joint was identified on ultrasound. Using ultrasound guidance a 22-gauge, 3.5 inch needle with a mixture of 2:2:2 cc's lidocaine:bupivicaine:depomedrol was directed from a lateral to medial direction via in-plane technique into the glenohumeral joint with visualization of appropriate spread of injectate into the joint. Patient tolerated the procedure well without immediate complications.      Procedure, treatment alternatives, risks and benefits explained, specific risks discussed. Consent was given by the patient. Immediately prior to procedure a time out was called to verify the correct patient, procedure, equipment, support staff and site/side marked as required. Patient was prepped and draped in the usual sterile fashion.     - I evaluated the patient about 10 minutes post-injection and he had excellent improvement in pain and range of motion - follow-up with Dr. Ninfa Linden as indicated in 2 weeks; I am happy to see them as needed  Elba Barman, DO [place]  This note was dictated using Dragon naturally speaking software and may contain errors in syntax, spelling, or content which have not been identified prior to signing this note.

## 2022-10-25 ENCOUNTER — Other Ambulatory Visit: Payer: Self-pay | Admitting: Cardiovascular Disease

## 2022-10-25 DIAGNOSIS — I319 Disease of pericardium, unspecified: Secondary | ICD-10-CM

## 2022-10-30 ENCOUNTER — Other Ambulatory Visit: Payer: Self-pay | Admitting: Cardiovascular Disease

## 2022-10-30 DIAGNOSIS — R002 Palpitations: Secondary | ICD-10-CM

## 2022-10-30 DIAGNOSIS — G4733 Obstructive sleep apnea (adult) (pediatric): Secondary | ICD-10-CM

## 2022-10-30 DIAGNOSIS — I10 Essential (primary) hypertension: Secondary | ICD-10-CM

## 2022-11-02 ENCOUNTER — Telehealth: Payer: Self-pay | Admitting: Cardiovascular Disease

## 2022-11-02 NOTE — Telephone Encounter (Signed)
Contacted patient, cancelled appointment on Friday 11/04/2022, and rescheduled to 11/25/2022.  Patient verbalized understanding.

## 2022-11-02 NOTE — Telephone Encounter (Signed)
  Pt said, he is feeling sick and want to reschedule appt with Dr. Claiborne Billings, advised next available in August, he said since the nurse able to work him in before the nurse can do it again. Request to get a callback from the nurse

## 2022-11-04 ENCOUNTER — Ambulatory Visit: Payer: 59 | Admitting: Cardiovascular Disease

## 2022-11-09 ENCOUNTER — Encounter: Payer: Self-pay | Admitting: Orthopaedic Surgery

## 2022-11-09 ENCOUNTER — Ambulatory Visit: Payer: 59 | Admitting: Orthopaedic Surgery

## 2022-11-09 DIAGNOSIS — G8929 Other chronic pain: Secondary | ICD-10-CM | POA: Diagnosis not present

## 2022-11-09 DIAGNOSIS — M25512 Pain in left shoulder: Secondary | ICD-10-CM

## 2022-11-09 NOTE — Progress Notes (Signed)
Nathaniel Jones comes in today to go over an MRI of his left shoulder.  He says with rest and a steroid injection his shoulder is feeling better.  The MRI of his shoulder is reviewed with him and it does show that there is a small partial-thickness insertional tear of the distal posterior aspect of the supraspinatus tendon but no full-thickness tear.  There is no muscle atrophy at all.  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  There is also mild to moderate glenohumeral cartilage changes.  He is a very athletic and strong individual with significantly muscular shoulders.  We talked about treatment options.  It is good that things are feeling better with a steroid injection and rest but I do feel that he would be someone that would benefit from a few physical therapy visits to help show him some exercises that he can do for his shoulders that would help still protect the muscles and potentially any type of the modalities that can decrease some of his pain further.  As for now, we both talked about not considering a surgical referral until the conservative treatment fails.  I would like to see him back in about 6 weeks after course of formal physical therapy on his left shoulder.  He agrees with this treatment plan.  All question concerns were addressed and answered.

## 2022-11-10 ENCOUNTER — Other Ambulatory Visit: Payer: Self-pay

## 2022-11-10 DIAGNOSIS — G8929 Other chronic pain: Secondary | ICD-10-CM

## 2022-11-25 ENCOUNTER — Encounter: Payer: Self-pay | Admitting: Cardiovascular Disease

## 2022-11-25 ENCOUNTER — Ambulatory Visit: Payer: 59 | Attending: Cardiovascular Disease | Admitting: Cardiovascular Disease

## 2022-11-25 VITALS — BP 138/92 | HR 62 | Ht 75.0 in | Wt 335.4 lb

## 2022-11-25 DIAGNOSIS — I319 Disease of pericardium, unspecified: Secondary | ICD-10-CM

## 2022-11-25 DIAGNOSIS — R072 Precordial pain: Secondary | ICD-10-CM | POA: Diagnosis not present

## 2022-11-25 DIAGNOSIS — G4733 Obstructive sleep apnea (adult) (pediatric): Secondary | ICD-10-CM

## 2022-11-25 DIAGNOSIS — K760 Fatty (change of) liver, not elsewhere classified: Secondary | ICD-10-CM

## 2022-11-25 DIAGNOSIS — U071 COVID-19: Secondary | ICD-10-CM

## 2022-11-25 DIAGNOSIS — I10 Essential (primary) hypertension: Secondary | ICD-10-CM | POA: Diagnosis not present

## 2022-11-25 DIAGNOSIS — E559 Vitamin D deficiency, unspecified: Secondary | ICD-10-CM

## 2022-11-25 NOTE — Progress Notes (Unsigned)
Cardiology Office Note    Date:  11/27/2022   ID:  Nathaniel Jones, DOB [birth year], MRN [id number]  PCP:  Nathaniel Graff.Marlou Sa, MD  Cardiologist:  Shelva Majestic, MD   9 month F/U  History of Present Illness:  Nathaniel Jones is a 42 y.o. male who I had seen in the past for evaluation of chest pain with atypical features as well as sleepiness.  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  Since his playing days when his weight was 255 pounds he has gained weight and has been around 315.  He has a history of hypertension.  In 2018 he had a normal nuclear stress test and an echo Doppler study showed an EF of 55% with moderate LVH with mild left atrial enlargement and mild TR.  He has obstructive sleep apnea and has had issues with significant excessive daytime sleepiness.  Remotely had been evaluated by Dr. Rexene Jones and was reevaluated by me on a home study in October 2021 which showed mild overall sleep apnea with an AHI of 10 but in the past he had been noted to have severe OSA during REM sleep which was unable to be assessed on the home study.  He never had a CPAP titration or AutoPap initiation.  He developed COVID-19 infection on October 02, 2020.  He presented to the emergency room on October 27, 2020 with headaches, chills myalgias and fever.  He was sent home.  He returned on January 27 with neck pain and significant headache and apparently had a spinal tap which was negative.  A chest x-ray was suspicious for possible pneumonia and he was discharged on Augmentin.  He represented the next day with left precordial chest pain and low-grade fever as well as mild cough.  He was admitted to the hospital on October 31, 2020.  CT of the chest was negative for PE and was felt he had pericarditis and possible consolidation of his right middle upper lobe.  He continued to have low fever.  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   Blood cultures were negative.  Procalcitonin was greater than 4, CRP was 40.  Repeat Covid 19 PCR test was negative.  An echo Doppler study showed an EF of 60 to 65% with grade 1 diastolic dysfunction there was no mention of any pericardial effusion.  He was treated with Rocephin and azithromycin and was recommended to take colchicine and continue allopurinol with his history of gout.  The patient was to have a repeat chest x-ray after completion of antibiotic therapy.  He was evaluated by Nathaniel Jones on November 18, 2020 and admitted to feeling terrible.  He had finished his antibiotic therapy for pneumonia and sinusitis and admitted to being breathless after 1 flight of steps and even taking a shower left knee is significantly fatigued.  He has not yet returned to work.  Due to diagnosis of pericarditis it was recommended he complete 3 months of colchicine therapy she started him on Relafen instead of ibuprofen but he did not notice any significant change and she had a Protonix 40 mg daily.    I saw him on December 04, 2020 at which time he continued to experience increasing shortness of breath with activity and admitted to being very weak.  He was experiencing episodes of Jones stabbing pain when he was lying down on the left chest region.  He no longer had a  fever.  He had not had a follow-up chest x-ray.  He was not sleeping well.  I recommended he undergo a PA and lateral chest x-ray as well as laboratory.  CRP was 1.  Hemoglobin and hematocrit were 12.9 and 39.5.  Creatinine was 1.09.  Erythrocyte sedimentation rate had improved and was now 18 compared to 98 in January and 40 on November 18, 2020.  A chest x-ray January 15, 2021 was negative for acute cardiopulmonary disease.  I scheduled him for cardiac MRI which was done on January 18, 2021 was essentially normal and showed normal LV chamber size with mild increased wall thickness of 12 mm.  LVEF was 60% without regional wall motion abnormalities.  There was  no evidence for myocardial edema.  There was no postcontrast delayed myocardial enhancement.  RV function was normal with an EF of 53%.  Valves were normal.  The pericardium was normal.  His aorta was mildly dilated at the level of the sinus Valsalva measuring 41 mm.  I saw him on January 18, 2021 in follow-up office visit.  At that time he felt better but had not yet returned to work.  However he was still experiencing some pain while at sleeping on his back and reason was sleeping on his left side.  He described the pain as a knifelike sensation is also noted a vague residual dullness.  During that evaluation, I recommended that he can return to work.  I felt his chest pain was nonischemic and chest wall discomfort.  He was still waiting for his machine and I recommended institution of AutoPap therapy.  I saw him as an add-on on Feb 15, 2021.  Since his previous evaluation he had  called the office on January 28, 2021 after he experienced recurrent Jones chest pain episodes after he had had a heated discussion with his supervisor at work.  He had noted that his heart rate increases when he lies on his back even before he falls asleep with heart rates in the upper 90s.  He has tried instituting AutoPap therapy.  However he is only been sleeping several hours with it.  He has been having difficulty with the full facemask that was provided to him which covers the nose and mouth.  He was added onto my schedule for follow-up evaluation.  During that evaluation, his ECG showed sinus rhythm at 72 bpm with previously noted T wave abnormality in leads III and aVF.  He was experiencing knifelike chest pain which seem to be precipitated by [place] and was taking ibuprofen 600 mg twice a day with benefit.  With his recent heart rate increase I suggested a trial of increasing metoprolol succinate from 25 to 50 mg.  I had a long discussion with him regarding his initiation of CPAP therapy.  Apparently, Nathaniel Jones CPAP machine was  picked up by the DME company on August 2 due to very poor compliance and no recent usage.  He was seen by Dr. Grayling Congress on July 30, 2021 complaining of 2 to 3 weeks of intermittent chest tightness more described as a Jones pain on the right which then moved to his left chest.  It also had pleuritic component being worse with deep inspiration.  The time he was very concerned about recurrent pericarditis.  Dr. Davina Poke was not convinced this was pericarditis and that may be just pleurisy related to a chest cold.  However sed rate and CRP was obtained and he was started on therapy with  indomethacin 25 mg 3 times a day for 1 week.  Sedimentation rate was normal at 8 and C-reactive protein was increased at 19.  Subsequently, he was reevaluated on August 03, 2021 Almyra Deforest, [place] after he had called the office complaining of worsening symptoms.  He was complaining of pleuritic chest pain with Jones pain with deep inspiration and laying down which was the same pain that he had experienced with pericarditis.  At that time, he was represcribed colchicine 0.6 mg twice a day and he was wondering about getting an antibiotic but it was felt that most likely he had a nonbacterial etiology and antibiotic was not prescribed.  I saw him as an add-on in August 25, 2021.  He had been  evaluated in urgent care on November 16 due to concerns about worsening chest pain and shortness of breath.  A chest x-ray was obtained and he was started on Levaquin and prednisone for presumed walking pneumonia.  August 24, 2021 he read presented to [facility] ER [facility] [place] with shortness of breath that was waking him up from sleep at night and similar pleuritic-like chest pain.  D-dimer was negative.  White blood count was 16,800, but he was on prednisone.  Glucose had increased to 127.  COVID test was negative.  Troponins were negative.  BNP was normal at 63.4.  His blood pressure has been elevated.  He continues to experience some twinges of  chest pain when he takes a deep breath but actually the symptoms have somewhat improved since initiation of colchicine and he has continued to be on indomethacin 50 mg twice a day.  He was sleeping poorly.   During that evaluation his ECG showed sinus bradycardia 53 bpm.  He had normal PR segment.  On exam he did not have a friction rub.  His blood pressure was elevated and I felt this may have been also contributed by his rem related untreated sleep apnea.  I recommended sodium restriction and added amlodipine 5 mg for improved blood pressure control as well as spironolactone 12.5 mg daily.  I scheduled him for 2D echo Doppler study.  I recommended he continue nonsteroidal anti-inflammatory medicine and colchicine.  I discussed his untreated sleep apnea at length.  He was not a candidate for inspire with his BMI of 39.68.  We discussed reevaluation versus a dental evaluation for a customized oral appliance.  I also recommended repeat laboratory with a comprehensive metabolic panel, CBC, erythrocyte sedimentation rate, and CRP.  When I saw him in follow-up on October 07, 2021 he felt better but still was fatigued.   His echo Doppler study on September 02, 2021 showed an EF of 55 to 60% with normal wall motion.  There was mild biatrial enlargement.  Pericardium was normal without evidence for effusion.  Laboratory revealed a CRP of 2, normal chemistry and sed rate of 7.  CBC was normal with white blood count of 16.8 which had improved from November 22 when it was 16.8.  He feels better.    I last saw him on Feb 16, 2022. Over the last several months, he had felt well but 3 weeks ago he had an upper respiratory tract infection.  This was associated with some chest pain in his chest and across his back which seem more prominent when laying down.  He again developed some lethargy and felt fatigued.  He denies any current cough.  He continued to be on colchicine 0.6 mg twice a day he resumed  taking spironolactone 12.5  mg for the last week.  He continued to be on metoprolol succinate 50 mg daily and amlodipine 5 mg.  He was taking ibuprofen 400 mg.  During that evaluation his ECG was stable and he had a normal PR segment and no ST segment changes.  I recommended repeat laboratory and follow-up echocardiographic evaluation.  Laboratory on Feb 16, 2022 showed a erythrocyte sedimentation rate at 17.  CRP was 2.  There was mild transaminase elevation.  An echo Doppler study from March 10, 2022 showed normal LV function with EF 60 to 65%.  There was grade 2 diastolic dysfunction with mild LVH and moderate LA dilation.  There was mild MR.  There was no evidence for pericardial effusion.  Presently, Mr. Danh feels well.  He was recently in the [employer].  He is back exercising.  He has been lifting weights and walking over the past 3 months typically at for 45 minutes to an hour.  He denies any pleuritic chest pain.  There are no fever chills or night sweats.  There is no shortness of breath.  He he has not been successful with weight loss.  He has continued to be on amlodipine 5 mg, metoprolol succinate 50 mg, and still has been taking colchicine 0.6 mg twice a day.  He presents for reevaluation.  Past Medical History:  Diagnosis Date   Arthritis of both knees    Back pain    Chest pain    Constipation    Depression    Food allergy    coconut   Hypertension    Joint pain    Low back pain    herniated disc   Lumbar herniated disc    Obesity    OSA (obstructive sleep apnea)    has not yet received CPAP   Other fatigue    Pericarditis    in his 12s   Prediabetes    Shortness of breath    Shortness of breath on exertion     Past Surgical History:  Procedure Laterality Date   KNEE SURGERY Right 2016    Current Medications: Outpatient Medications Prior to Visit  Medication Sig Dispense Refill   allopurinol (ZYLOPRIM) 100 MG tablet Take 100 mg by mouth daily.     amLODipine (NORVASC) 5  MG tablet TAKE 1 TABLET (5 MG TOTAL) BY MOUTH DAILY. 30 tablet 5   colchicine 0.6 MG tablet TAKE 1 TABLET BY MOUTH 2 TIMES DAILY. 90 tablet 3   metoprolol succinate (TOPROL-XL) 50 MG 24 hr tablet TAKE 1 TABLET BY MOUTH DAILY. TAKE WITH OR IMMEDIATELY FOLLOWING A MEAL. 30 tablet 11   naproxen (NAPROSYN) 500 MG tablet Take 1 tablet by mouth 2 (two) times daily.     pantoprazole (PROTONIX) 40 MG tablet Take 1 tablet (40 mg total) by mouth daily. Please keep scheduled appointment 60 tablet 0   QUEtiapine (SEROQUEL) 25 MG tablet Take 25 mg by mouth at bedtime.     No facility-administered medications prior to visit.     Allergies:   Coconut (cocos nucifera)   Social History   Socioeconomic History   Marital status: Single    Spouse name: Not on file   Number of children: 1   Years of education: Not on file   Highest education level: Not on file  Occupational History   Occupation: Psychiatric nurse    Employer: [employer]. Designer, industrial/product, Art therapist, &  Center Director)  Tobacco Use   Smoking status: Never   Smokeless tobacco: Never  Vaping Use   Vaping Use: Never used  Substance and Sexual Activity   Alcohol use: No   Drug use: No   Sexual activity: Not Currently  Other Topics Concern   Not on file  Social History Narrative   Not on file   Social Determinants of Health   Financial Resource Strain: Not on file  Food Insecurity: Not on file  Transportation Needs: Not on file  Physical Activity: Not on file  Stress: Not on file  Social Connections: Not on file    Socially he is single.  He was born in [place] with early high school.  He has a BS degree.  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  He has a child.  Family History:  The patient's family history includes Alcoholism in his father and another family member;  Depression in his mother and another family member; Diabetes in his brother, maternal grandmother, mother, and another family member; Heart disease in his father; High Cholesterol in his maternal grandmother; Hyperlipidemia in his father and mother; Hypertension in his father, mother, and another family member; Obesity in his mother and another family member; Sleep apnea in his mother and another family member; Stroke in his maternal grandfather and maternal grandmother.     ROS General: Negative; No fevers, chills, or night sweats;  HEENT: Negative; No changes in vision or hearing, sinus congestion, difficulty swallowing Pulmonary: Significant improvement in prior pleuritic chest pain Cardiovascular: See HPI GI: Negative; No nausea, vomiting, diarrhea, or abdominal pain GU: Negative; No dysuria, hematuria, or difficulty voiding Musculoskeletal: Negative; no myalgias, joint pain, or weakness Hematologic/Oncology: Negative; no easy bruising, bleeding Endocrine: Negative; no heat/cold intolerance; no diabetes Neuro: Negative; no changes in balance, headaches Skin: Negative; No rashes or skin lesions Psychiatric: Negative; No behavioral problems, depression Sleep: OSA, noncompliant with CPAP and as result is CPAP machine was picked up by his DME company;  history of snoring, daytime sleepiness, hypersomnolence Other comprehensive 14 point system review is negative.   PHYSICAL EXAM:   VS:  BP (!) 138/92 (BP Location: Left Arm, Patient Position: Sitting, Cuff Size: Large)   Pulse 62   Ht 6' 3"$  (1.905 m)   Wt (!) 335 lb 6.4 oz (152.1 kg)   SpO2 95%   BMI 41.92 kg/m     Repeat blood pressure by me was 128/84.  Wt Readings from Last 3 Encounters:  11/25/22 (!) 335 lb 6.4 oz (152.1 kg)  02/16/22 (!) 320 lb 6.4 oz (145.3 kg)  12/21/21 (!) 316 lb (143.3 kg)    General: Alert, oriented, no distress.  Mesomorphic body habitus Skin: normal turgor, no rashes, warm and dry HEENT:  Normocephalic, atraumatic. Pupils equal round and reactive to light; sclera anicteric; extraocular muscles intact;  Nose without nasal septal hypertrophy Mouth/Parynx benign; Mallinpatti scale 3 Neck: No JVD, no carotid bruits; normal carotid upstroke Lungs: clear to ausculatation and percussion; no wheezing or rales Chest wall: without tenderness to palpitation Heart: PMI not displaced, RRR, s1 s2 normal, 1/6 systolic murmur, no diastolic murmur, no rubs, gallops, thrills, or heaves Abdomen: soft, nontender; no hepatosplenomehaly, BS+; abdominal aorta nontender and not dilated by palpation. Back: no CVA tenderness Pulses 2+ Musculoskeletal: full range of motion, normal strength, no joint deformities Extremities: no  clubbing cyanosis or edema, Homan's sign negative  Neurologic: grossly nonfocal; Cranial nerves grossly wnl Psychologic: Normal mood and affect    Studies/Labs Reviewed:   November 25, 2022  ECG (independently read by me): NSR at 62, normal PR segment;  no STT changes  Feb 16, 2022 ECG (independently read by me): Sinus bradycardia 56 bpm.  Normal PR segment.  No significant ST changes.  October 07, 2021 ECG (independently read by me): NSR at 66; PR 182 msec; QTc 429 msec  August 25, 2021 ECG (independently read by me):  Sinus bradycardia at 53, normal PR segment   May 16,2022 ECG (independently read by me): Normal sinus rhythm at 72 bpm.  Previously noted T wave abnormality in lead III and aVF.  January 28, 2021 ECG (independently read by me): NSR at 63; LVH Q III T wave abnormality III, aVF  December 04, 2020 ECG (independently read by me): NSR at 71; mild early repolarization changes; QTc 430  I reviewed his recent ECGs from his emergency room and hospitalizations.  Early repolarization changes had evolved since his October 28, 2020 ECG.  Recent Labs:    Latest Ref Rng & Units 02/16/2022   12:45 PM 12/07/2021    8:50 AM 09/29/2021   11:13 AM  BMP  Glucose 70 - 99 mg/dL  85  90  104   BUN 6 - 24 mg/dL 11  19  12   $ Creatinine 0.76 - 1.27 mg/dL 1.01  1.00  1.08   BUN/Creat Ratio 9 - 20 11  19  11   $ Sodium 134 - 144 mmol/L 143  143  142   Potassium 3.5 - 5.2 mmol/L 4.3  4.4  4.4   Chloride 96 - 106 mmol/L 106  102  105   CO2 20 - 29 mmol/L 24  23  24   $ Calcium 8.7 - 10.2 mg/dL 9.7  9.3  9.4         Latest Ref Rng & Units 02/16/2022   12:45 PM 12/07/2021    8:50 AM 09/29/2021   11:13 AM  Hepatic Function  Total Protein 6.0 - 8.5 g/dL 7.3  7.4  6.8   Albumin 4.0 - 5.0 g/dL 4.5  4.5  4.2   AST 0 - 40 IU/L 45  33  39   ALT 0 - 44 IU/L 71  53  42   Alk Phosphatase 44 - 121 IU/L 77  87  82   Total Bilirubin 0.0 - 1.2 mg/dL 0.7  0.7  0.4        Latest Ref Rng & Units 02/16/2022   12:45 PM 12/07/2021    8:50 AM 09/29/2021   11:13 AM  CBC  WBC 3.4 - 10.8 x10E3/uL 5.7  8.1  6.8   Hemoglobin 13.0 - 17.7 g/dL 14.5  14.6  13.9   Hematocrit 37.5 - 51.0 % 43.0  44.3  42.4   Platelets 150 - 450 x10E3/uL 212  202  236    Lab Results  Component Value Date   MCV 89 02/16/2022   MCV 92 12/07/2021   MCV 88 09/29/2021   Lab Results  Component Value Date   TSH 1.700 12/07/2021   Lab Results  Component Value Date   HGBA1C 6.0 (H) 12/07/2021     BNP    Component Value Date/Time   BNP 63.4 08/24/2021 2030    ProBNP No results found for: "PROBNP"   Lipid Panel     Component Value Date/Time  CHOL 152 12/07/2021 0850   TRIG 82 12/07/2021 0850   HDL 62 12/07/2021 0850   LDLCALC 74 12/07/2021 0850   LABVLDL 16 12/07/2021 0850     RADIOLOGY: No results found.   Additional studies/ records that were reviewed today include:  I reviewed his prior office notes, January 2022 hospital records, ECGs, and subsequent evaluation with Doreene Adas, PA   ASSESSMENT:    1. Precordial pain   2. Pericarditis, unspecified chronicity, unspecified type: resolved   3. Essential hypertension   4. OSA (obstructive sleep apnea)   5. COVID-19 virus infection:  January 2022   6. NAFLD (nonalcoholic fatty liver disease)   7. Vitamin D insufficiency     PLAN:  Mr. TRENTIN SANDA is a 42 year old gentleman who has a history of hypertension, obstructive sleep apnea, Covid 19 infection on October 02, 2020 with subsequent sequelae of fever, chills, headache, myalgias, and pneumonia.  Spinal tap was negative.  He was felt to have possible pericarditis and was treated with colchicine and ibuprofen and had taken Relafen without much benefit compared to previous ibuprofen.  When I saw him on December 04, 2020 I recommended he resume ibuprofen with his recurrent symptomatology.  A PA and lateral chest x-ray was without acute disease.  I also scheduled him for cardiac MRI which essentially was normal.  Subsequent laboratory also showed significantly improved erythrocyte sedimentation rate and C-reactive protein was normal.  He has developed recurrent episodes of knifelike chest discomfort leading to reevaluation in May.  In October 2022 he again experienced recurrent symptomatology and saw Dr. Davina Poke and subsequently Almyra Deforest, PA.  C-reactive protein had elevated to 19 and he was started on indomethacin.  He was started back on colchicine when seen by Almyra Deforest, PA on August 03, 2021.  He has continued to experience recurrent symptomatology.  When I saw him in late November 2022, he had stage II hypertension and I started him on amlodipine 5 mg in addition to spironolactone.  A subsequent 2D echo Doppler study showed normal systolic function.  Pericardium was normal.  There was mild atrial dilatation.  He has been on ibuprofen most recently at 600 mg every 8 hours in addition to colchicine 0.6 mg twice daily as well as amlodipine 5 mg, metoprolol succinate 50 mg daily.  With this therapy, his inflammation has essentially resolved.  His CRP is 2 and sedimentation rate 7.  At his last office visit in January 2026 3 his blood pressure was stable and I recommended reduction of ibuprofen  to 400 mg every 8 hours for 2 days and then reduce this to 200 mg every 8 hours with subsequent as needed.  He will continue colchicine for another 3 months.  He will undergo dental evaluation for customized oral appliance and apparently was to see Dr. Ron Parker for initial evaluation.  He developed URI symptoms approximately 3 weeks ago but denied any associated fever but admitted to increasing cough.  His chest pain resumed across his chest and back associated with coughing and lying flat and had experienced more lethargy.  His blood pressure today remained stable.  At his May 2023 evaluation, I recommended laboratory as well as 2D echo Doppler follow-up assessment.  Erythrocyte sedimentation rate was upper normal at 17.  C-reactive protein was 2.  There was mild transaminase elevation felt to be due to nonalcoholic fatty liver disease.  His echo Doppler study showed an EF of 60 to 65% with grade 2 diastolic dysfunction and mild LVH.  There was moderate LA dilatation, mild MR, and no evidence for pericardial effusion.  Presently, 9 months later he feels well.  He does not have any fatigability.  He is back to exercising, lifting weights as well as walking.  He continues to be on amlodipine 5 mg and metoprolol succinate 50 mg daily.  He has been on vitamin D for vitamin D deficiency.  Repeat blood pressure by me was stable.  He was wondering how long he needed to be on the colchicine.  He also has a history of gout for which he takes prophylactic allopurinol.  Presently, with his stability and with him having been on colchicine since October 2022 I have suggested a taper.  I have recommended he reduce his colchicine dose to 0.6 mg daily for the next 2 weeks and then reduce it to every other day for 2 weeks and then discontinue.  If he experiences recurrent symptomatology he will let us know.  He no longer uses CPAP therapy.  He believes he is sleeping well.  Otherwise as long as he is stable I will see him in 6 months  for reevaluation.   Medication Adjustments/Labs and Tests Ordered: Current medicines are reviewed at length with the patient today.  Concerns regarding medicines are outlined above.  Medication changes, Labs and Tests ordered today are listed in the Patient Instructions below. Patient Instructions  Medication Instructions:    Wean off taking Colchicine 0.6 mg  decrease to one tablet a day for 2weeks - if yu tolerate the decrease take every other day for 2 weeks then stop.  Continue with all other medications   *If you need a refill on your cardiac medications before your next appointment, please call your pharmacy*   Lab Work: Not needed    Testing/Procedures: Not needed   Follow-Up: At [facility], you and your health needs are our priority.  As part of our continuing mission to provide you with exceptional heart care, we have created designated Provider Care Teams.  These Care Teams include your primary Cardiologist (physician) and Advanced Practice Providers (APPs -  Physician Assistants and Nurse Practitioners) who all work together to provide you with the care you need, when you need it.     Your next appointment:   6 month(s)  The format for your next appointment:   In Person  Provider:   Shelva Majestic, MD       Signed, Shelva Majestic, MD  11/27/2022 10:08 AM    [facility] [address], [place], [place], [place]  [address] Phone: [phone]

## 2022-11-25 NOTE — Patient Instructions (Addendum)
Medication Instructions:    Wean off taking Colchicine 0.6 mg  decrease to one tablet a day for 2weeks - if yu tolerate the decrease take every other day for 2 weeks then stop.  Continue with all other medications   *If you need a refill on your cardiac medications before your next appointment, please call your pharmacy*   Lab Work: Not needed    Testing/Procedures: Not needed   Follow-Up: At [name], you and your health needs are our priority.  As part of our continuing mission to provide you with exceptional heart care, we have created designated Provider Care Teams.  These Care Teams include your primary Cardiologist (physician) and Advanced Practice Providers (APPs -  Physician Assistants and Nurse Practitioners) who all work together to provide you with the care you need, when you need it.     Your next appointment:   6 month(s)  The format for your next appointment:   In Person  Provider:   Shelva Majestic, MD

## 2022-11-27 ENCOUNTER — Encounter: Payer: Self-pay | Admitting: Cardiovascular Disease

## 2022-11-28 ENCOUNTER — Encounter: Payer: Self-pay | Admitting: Physical Therapy

## 2022-11-28 ENCOUNTER — Other Ambulatory Visit: Payer: Self-pay

## 2022-11-28 ENCOUNTER — Ambulatory Visit: Payer: 59 | Admitting: Physical Therapy

## 2022-11-28 DIAGNOSIS — G8929 Other chronic pain: Secondary | ICD-10-CM

## 2022-11-28 DIAGNOSIS — M6281 Muscle weakness (generalized): Secondary | ICD-10-CM | POA: Diagnosis not present

## 2022-11-28 DIAGNOSIS — R293 Abnormal posture: Secondary | ICD-10-CM

## 2022-11-28 DIAGNOSIS — M25512 Pain in left shoulder: Secondary | ICD-10-CM | POA: Diagnosis not present

## 2022-11-28 NOTE — Therapy (Unsigned)
OUTPATIENT PHYSICAL THERAPY SHOULDER EVALUATION   Patient Name: Nathaniel Jones MRN: [id number] DOB:[birth year], 42 y.o., male Today's Date: 11/28/2022  END OF SESSION:  PT End of Session - 11/28/22 1649     Visit Number 1    Number of Visits 6    Date for PT Re-Evaluation 01/23/23    Authorization Type [employer] Other    PT Start Time 1603    PT Stop Time 1640    PT Time Calculation (min) 37 min    Activity Tolerance Patient tolerated treatment well    Behavior During Therapy WFL for tasks assessed/performed             Past Medical History:  Diagnosis Date   Arthritis of both knees    Back pain    Chest pain    Constipation    Depression    Food allergy    coconut   Hypertension    Joint pain    Low back pain    herniated disc   Lumbar herniated disc    Obesity    OSA (obstructive sleep apnea)    has not yet received CPAP   Other fatigue    Pericarditis    in his 98s   Prediabetes    Shortness of breath    Shortness of breath on exertion    Past Surgical History:  Procedure Laterality Date   KNEE SURGERY Right 2016   Patient Active Problem List   Diagnosis Date Noted   Gout 04/15/2022   Hyperglycemia 01/26/2022   CAP (community acquired pneumonia) 10/31/2020   Pericarditis, viral 10/31/2020   Essential hypertension 10/31/2020   OSA (obstructive sleep apnea) 10/31/2020   Sleep disturbance 10/15/2020   Rectal bleeding 10/15/2020   Primary generalized (osteo)arthritis 10/15/2020   Prediabetes 10/15/2020   Obesity 10/15/2020   Neck pain 10/15/2020   Muscle contraction headache 10/15/2020   Decreased testosterone level 10/15/2020   Suspected COVID-19 virus infection 12/24/2019   Unilateral primary osteoarthritis, left knee 05/17/2018   Unilateral primary osteoarthritis, right knee 05/17/2018   Chronic pain of both knees 05/17/2018   Biceps rupture, proximal, right, initial encounter 05/22/2017   Chronic bilateral low back pain without  sciatica 03/08/2017   Lateral epicondylitis, right elbow 03/08/2017   It band syndrome, left 03/08/2017    PCP: Alroy Dust, L.Marlou Sa, MD  REFERRING PROVIDER: Mcarthur Rossetti, MD  REFERRING DIAG: [phone] (ICD-10-CM) - Chronic left shoulder pain  THERAPY DIAG:  Chronic left shoulder pain  Muscle weakness (generalized)  Abnormal posture  Rationale for Evaluation and Treatment: Rehabilitation  ONSET DATE: 3 month onset of pain  SUBJECTIVE:  SUBJECTIVE STATEMENT: He relays he thinks he injured his left shoulder bench pressing and he felt a pop. Since then he has been afraid to use his arm. He did have injection in it which helped and had MRI. He is Right handed.   PERTINENT HISTORY: PMH: HTN, prediabetes, chronic LBP  PAIN:  Are you having pain? Yes: NPRS scale: 2-3, currently, can get up to 6/10 Pain location: anterior and posterior left shoulder Pain description: dull ache Aggravating factors: reaching up or back or across, driving Relieving factors: injection, propping his arm up on pillows  PRECAUTIONS: None  WEIGHT BEARING RESTRICTIONS: No  FALLS:  Has patient fallen in last 6 months? No  OCCUPATION: Geneticist, molecular, actor  PLOF: Independent  PATIENT GOALS: Get shoulder back to normal to work out again, play basketball  NEXT MD VISIT:   OBJECTIVE:   DIAGNOSTIC FINDINGS:    IMPRESSION: 1. Tiny, thin, linear fluid bright partial-thickness insertional midsubstance tear within the distal posterior supraspinatus tendon footprint. 2. Mild-to-moderate anterior greater than posterior infraspinatus tendinosis. 3. Moderate to high-grade proximal long head of the biceps tendinosis with small midsubstance longitudinal partial-thickness biceps tendon tear within the proximal  aspect of the bicipital groove. 4. Persistent os acromiale with mild fluid across the synchondrosis and associated degenerative changes. Mild acromioclavicular osteoarthritis. 5. Mild-to-moderate glenohumeral cartilage degenerative changes.  PATIENT SURVEYS:  Eval: FOTO 49%, goal 68%  COGNITION: Overall cognitive status: Within functional limits for tasks assessed  POSTURE:   UPPER EXTREMITY ROM:   Active ROM Right eval Left eval  Shoulder flexion  160  Shoulder extension    Shoulder abduction  145  Shoulder adduction    Shoulder internal rotation functional behind back  T11 L2*  Shoulder external rotation functional  T3 T1*  Elbow flexion    Elbow extension    Wrist flexion    Wrist extension    Wrist ulnar deviation    Wrist radial deviation    Wrist pronation    Wrist supination    (Blank rows = not tested)  UPPER EXTREMITY MMT:  MMT Right eval Left eval  Shoulder flexion 5 4*  Shoulder extension  4  Shoulder abduction 5 4  Shoulder adduction    Shoulder internal rotation 5 5  Shoulder external rotation 5 4+*  Middle trapezius  4-  Lower trapezius  4-  Elbow flexion 5 4+  Elbow extension 5 4  Wrist flexion    Wrist extension    Wrist ulnar deviation    Wrist radial deviation    Wrist pronation    Wrist supination    Grip strength (lbs) 5 5  (Blank rows = not tested)  JOINT MOBILITY TESTING:  PAMs: WNL  PALPATION:  TTP: supraspinatus tendon, bicep tendon  TODAY'S TREATMENT:  Eval HEP creation and review with demonstration and trial set preformed, see below for details  Ionto patch # 1, 1.0 CC dexamethasone, 4-6 hour wear home patch to anterior left shoulder   PATIENT EDUCATION: Education details: HEP, PT plan of care Person educated: Patient Education method: Explanation, Demonstration, Verbal cues, and Handouts Education comprehension: verbalized understanding and needs further education   HOME EXERCISE PROGRAM: Access Code:  [email or web address] URL: [email or web address] Date: 11/28/2022 Prepared by: Elsie Ra  Exercises - Standing Shoulder Internal Rotation Stretch with Towel (Mirrored)  - 2 x daily - 6 x weekly - 1 sets - 10 reps - 5 sec hold - Prone Single Arm Shoulder Y (Mirrored)  - 2 x daily - 6 x  weekly - 1-3 sets - 10 reps - 2-3 sec hold - Prone Shoulder Horizontal Abduction (Mirrored)  - 2 x daily - 3-4 x weekly - 1-3 sets - 10 reps - 3 sec hold - Prone Shoulder Extension - Single Arm (Mirrored)  - 2 x daily - 3-4 x weekly - 1-3 sets - 10 reps - 3 sec hold - Shoulder External Rotation with Anchored Resistance  - 2 x daily - 6 x weekly - 2-3 sets - 10 reps - Shoulder Internal Rotation with Resistance  - 2 x daily - 6 x weekly - 2-3 sets - 10 reps - Standing Shoulder Row with Anchored Resistance  - 2 x daily - 6 x weekly - 2-3 sets - 10 reps - Wall Push Up  - 2 x daily - 6 x weekly - 1-3 sets - 10 reps  ASSESSMENT:  CLINICAL IMPRESSION: Patient referred to PT for chronic left shoulder pain. See MRI results above as he partial tear in supraspinatus and biceps tendon. MD wants to try conservative treatment first. Patient overall shows good strength in both UE, but is noticably weaker on the left in his scapular stability muscles and RTC. He also showed some decreased ROM on the left and he will benefit from skilled PT to address these limitations and improve overall function.  OBJECTIVE IMPAIRMENTS: decreased activity tolerance, decreased shoulder mobility, decreased ROM, decreased strength, impaired flexibility, impaired UE use, postural dysfunction, and pain.  ACTIVITY LIMITATIONS: reaching, lifting, carry,  cleaning, driving, and or occupation  PERSONAL FACTORS:  also affecting patient's functional outcome.  REHAB POTENTIAL: Good  CLINICAL DECISION MAKING: Stable/uncomplicated  EVALUATION COMPLEXITY: Low    GOALS: Short term PT Goals Target date: 12/26/2022   Pt will be I and compliant  with HEP. Baseline:  Goal status: New Pt will decrease pain by 25% overall Baseline: Goal status: New  Long term PT goals Target date:01/23/2023   Pt will improve left shoulder to WNL to improve functional reaching Baseline: Goal status: New Pt will improve  left  shoulder strength to at least 4+/5 MMT to improve functional strength Baseline: Goal status: New Pt will improve FOTO to at least 68% functional to show improved function Baseline: Goal status: New Pt will reduce pain to overall less than 1/10 with usual activity and work activity. Baseline: Goal status: New  PLAN: PT FREQUENCY: 1 time every 2 weeks  PT DURATION: 12 weeks  PLANNED INTERVENTIONS (unless contraindicated): aquatic PT, Canalith repositioning, cryotherapy, Electrical stimulation, Iontophoresis with 4 mg/ml dexamethasome, Moist heat, traction, Ultrasound, gait training, Therapeutic exercise, balance training, neuromuscular re-education, patient/family education, prosthetic training, manual techniques, passive ROM, dry needling, taping, vasopnuematic device, vestibular, spinal manipulations, joint manipulations  PLAN FOR NEXT SESSION: review HEP, how was ionto patch? Dry needling if desired    Naylani Bradner, Student-PT 11/28/2022, 4:51 PM

## 2022-11-29 NOTE — Addendum Note (Signed)
Addended by: Debbe Odea on: 11/29/2022 08:38 AM   Modules accepted: Orders

## 2022-12-08 ENCOUNTER — Encounter: Payer: Self-pay | Admitting: Radiology

## 2022-12-14 ENCOUNTER — Ambulatory Visit: Payer: 59 | Admitting: Rehabilitative and Restorative Service Providers"

## 2022-12-14 ENCOUNTER — Encounter: Payer: Self-pay | Admitting: Rehabilitative and Restorative Service Providers"

## 2022-12-14 DIAGNOSIS — R293 Abnormal posture: Secondary | ICD-10-CM

## 2022-12-14 DIAGNOSIS — M25512 Pain in left shoulder: Secondary | ICD-10-CM

## 2022-12-14 DIAGNOSIS — M6281 Muscle weakness (generalized): Secondary | ICD-10-CM | POA: Diagnosis not present

## 2022-12-14 DIAGNOSIS — G8929 Other chronic pain: Secondary | ICD-10-CM | POA: Diagnosis not present

## 2022-12-14 NOTE — Therapy (Signed)
OUTPATIENT PHYSICAL THERAPY TREATMENT   Patient Name: Nathaniel Jones MRN: [id number] DOB:[birth year], 42 y.o., male Today's Date: 12/14/2022  END OF SESSION:  PT End of Session - 12/14/22 1504     Visit Number 2    Number of Visits 6    Date for PT Re-Evaluation 01/23/23    Authorization Type [employer] Other    PT Start Time [id number]    PT Stop Time 1545    PT Time Calculation (min) 39 min    Activity Tolerance Patient tolerated treatment well    Behavior During Therapy WFL for tasks assessed/performed              Past Medical History:  Diagnosis Date   Arthritis of both knees    Back pain    Chest pain    Constipation    Depression    Food allergy    coconut   Hypertension    Joint pain    Low back pain    herniated disc   Lumbar herniated disc    Obesity    OSA (obstructive sleep apnea)    has not yet received CPAP   Other fatigue    Pericarditis    in his 17s   Prediabetes    Shortness of breath    Shortness of breath on exertion    Past Surgical History:  Procedure Laterality Date   KNEE SURGERY Right 2016   Patient Active Problem List   Diagnosis Date Noted   Gout 04/15/2022   Hyperglycemia 01/26/2022   CAP (community acquired pneumonia) 10/31/2020   Pericarditis, viral 10/31/2020   Essential hypertension 10/31/2020   OSA (obstructive sleep apnea) 10/31/2020   Sleep disturbance 10/15/2020   Rectal bleeding 10/15/2020   Primary generalized (osteo)arthritis 10/15/2020   Prediabetes 10/15/2020   Obesity 10/15/2020   Neck pain 10/15/2020   Muscle contraction headache 10/15/2020   Decreased testosterone level 10/15/2020   Suspected COVID-19 virus infection 12/24/2019   Unilateral primary osteoarthritis, left knee 05/17/2018   Unilateral primary osteoarthritis, right knee 05/17/2018   Chronic pain of both knees 05/17/2018   Biceps rupture, proximal, right, initial encounter 05/22/2017   Chronic bilateral low back pain without sciatica  03/08/2017   Lateral epicondylitis, right elbow 03/08/2017   It band syndrome, left 03/08/2017    PCP: Alroy Dust, L.Marlou Sa, MD  REFERRING PROVIDER: Mcarthur Rossetti, MD  REFERRING DIAG: [phone] (ICD-10-CM) - Chronic left shoulder pain  THERAPY DIAG:  Chronic left shoulder pain  Muscle weakness (generalized)  Abnormal posture  Rationale for Evaluation and Treatment: Rehabilitation  ONSET DATE: 3 month onset of pain  SUBJECTIVE:  SUBJECTIVE STATEMENT: He indicated having tightness over the last week.  He indicated driving long distance over last weekend.    Reported going back feels the tightness.  He indicated getting sick that led him to be in bed.    PERTINENT HISTORY: PMH: HTN, prediabetes, chronic LBP  PAIN:  NPRS scale:4/10.  Pain location: anterior and posterior left shoulder Pain description: dull ache Aggravating factors: reaching up or back or across, driving Relieving factors: injection, propping his arm up on pillows  PRECAUTIONS: None  WEIGHT BEARING RESTRICTIONS: No  FALLS:  Has patient fallen in last 6 months? No  OCCUPATION: Geneticist, molecular, actor  PLOF: Independent  PATIENT GOALS: Get shoulder back to normal to work out again, play basketball   OBJECTIVE:   DIAGNOSTIC FINDINGS:  Eval review:    IMPRESSION: 1. Tiny, thin, linear fluid bright partial-thickness insertional midsubstance tear within the distal posterior supraspinatus tendon footprint. 2. Mild-to-moderate anterior greater than posterior infraspinatus tendinosis. 3. Moderate to high-grade proximal long head of the biceps tendinosis with small midsubstance longitudinal partial-thickness biceps tendon tear within the proximal aspect of the bicipital groove. 4. Persistent os acromiale with mild  fluid across the synchondrosis and associated degenerative changes. Mild acromioclavicular osteoarthritis. 5. Mild-to-moderate glenohumeral cartilage degenerative changes.  PATIENT SURVEYS:  Eval: FOTO 49%, goal 68%  COGNITION: Eval: Overall cognitive status: Within functional limits for tasks assessed  POSTURE:   UPPER EXTREMITY ROM:   Active ROM Right eval Left eval  Shoulder flexion  160  Shoulder extension    Shoulder abduction  145  Shoulder adduction    Shoulder internal rotation functional behind back  T11 L2*  Shoulder external rotation functional  T3 T1*  Elbow flexion    Elbow extension    Wrist flexion    Wrist extension    Wrist ulnar deviation    Wrist radial deviation    Wrist pronation    Wrist supination    (Blank rows = not tested)  UPPER EXTREMITY MMT:  MMT Right eval Left eval Left 12/14/2022  Shoulder flexion 5 4* 5/5  Shoulder extension  4   Shoulder abduction 5 4 4+/5  Shoulder adduction     Shoulder internal rotation 5 5 5/5  Shoulder external rotation 5 4+* 4+/5  Middle trapezius  4-   Lower trapezius  4-   Elbow flexion 5 4+ 5/5  Elbow extension 5 4 5/5  Wrist flexion     Wrist extension     Wrist ulnar deviation     Wrist radial deviation     Wrist pronation     Wrist supination     Grip strength (lbs) 5 5   (Blank rows = not tested)  JOINT MOBILITY TESTING:  PAMs: WNL  PALPATION:  TTP: supraspinatus tendon, bicep tendon  TODAY'S TREATMENT:  12/14/2022: Manual:  Compression to Lt infraspinatus, palpation during dry needling  Trigger Point Dry-Needling  Treatment instructions: Expect mild to moderate muscle soreness. S/S of pneumothorax if dry needled over a lung field, and to seek immediate medical attention should they occur. Patient verbalized understanding of these instructions and education.  Patient Consent Given: Yes Education handout provided: Yes Muscles treated: Lt infraspinatus Treatment response/outcome:  Twitch response noted c concordant symptoms indicated for Lt shoulder  Therex: UBE UE only 4 mins fwd/ back each way lvl 3.0 for ROM Cross arm stretch 15 sec x 3 Lt arm Standing blue band rows bilaterally 2 x 15 Standing blue band GH ext bilaterally 2 x 15  Standing blue band Lt shoulder ER c towel under arm 2 x 10 Standing blue band ER c flexion punch 2 x 10 , performed bilaterally Red [place]ball on wall 90 deg flexion circles cw, ccw 30 x 1  each way   11/28/2022 HEP creation and review with demonstration and trial set preformed, see below for details  Ionto patch # 1, 1.0 CC dexamethasone, 4-6 hour wear home patch to anterior left shoulder   PATIENT EDUCATION: Education details: HEP, PT plan of care Person educated: Patient Education method: Explanation, Demonstration, Verbal cues, and Handouts Education comprehension: verbalized understanding and needs further education   HOME EXERCISE PROGRAM: Access Code: [email or web address] URL: [email or web address] Date: 11/28/2022 Prepared by: Elsie Ra  Exercises - Standing Shoulder Internal Rotation Stretch with Towel (Mirrored)  - 2 x daily - 6 x weekly - 1 sets - 10 reps - 5 sec hold - Prone Single Arm Shoulder Y (Mirrored)  - 2 x daily - 6 x weekly - 1-3 sets - 10 reps - 2-3 sec hold - Prone Shoulder Horizontal Abduction (Mirrored)  - 2 x daily - 3-4 x weekly - 1-3 sets - 10 reps - 3 sec hold - Prone Shoulder Extension - Single Arm (Mirrored)  - 2 x daily - 3-4 x weekly - 1-3 sets - 10 reps - 3 sec hold - Shoulder External Rotation with Anchored Resistance  - 2 x daily - 6 x weekly - 2-3 sets - 10 reps - Shoulder Internal Rotation with Resistance  - 2 x daily - 6 x weekly - 2-3 sets - 10 reps - Standing Shoulder Row with Anchored Resistance  - 2 x daily - 6 x weekly - 2-3 sets - 10 reps - Wall Push Up  - 2 x daily - 6 x weekly - 1-3 sets - 10 reps  ASSESSMENT:  CLINICAL IMPRESSION:  Strong correlation from trigger points in  Lt infraspinatus twitch response to Lt anterior shoulder pain complaints.  Good knowledge of HEP was noted in review today.  Continued treatment indicated to help address remaining impairments.   OBJECTIVE IMPAIRMENTS: decreased activity tolerance, decreased shoulder mobility, decreased ROM, decreased strength, impaired flexibility, impaired UE use, postural dysfunction, and pain.  ACTIVITY LIMITATIONS: reaching, lifting, carry,  cleaning, driving, and or occupation  PERSONAL FACTORS:  also affecting patient's functional outcome.  REHAB POTENTIAL: Good  CLINICAL DECISION MAKING: Stable/uncomplicated  EVALUATION COMPLEXITY: Low    GOALS: Short term PT Goals Target date: 12/26/2022   Pt will be I and compliant with HEP. Baseline:  Goal status: Met Pt will decrease pain by 25% overall Baseline: Goal status: partially met  Long term PT goals Target date:01/23/2023   Pt will improve left shoulder to WNL to improve functional reaching Baseline: Goal status: New Pt will improve  left  shoulder strength to at least 4+/5 MMT to improve functional strength Baseline: Goal status: New Pt will improve FOTO to at least 68% functional to show improved function Baseline: Goal status: New Pt will reduce pain to overall less than 1/10 with usual activity and work activity. Baseline: Goal status: New  PLAN: PT FREQUENCY: 1 time every 2 weeks  PT DURATION: 12 weeks  PLANNED INTERVENTIONS (unless contraindicated): aquatic PT, Canalith repositioning, cryotherapy, Electrical stimulation, Iontophoresis with 4 mg/ml dexamethasome, Moist heat, traction, Ultrasound, gait training, Therapeutic exercise, balance training, neuromuscular re-education, patient/family education, prosthetic training, manual techniques, passive ROM, dry needling, taping, vasopnuematic device, vestibular, spinal manipulations, joint manipulations  PLAN FOR NEXT SESSION: Check on  dry needling response.    Scot Jun, PT, DPT, OCS, ATC 12/14/22  3:43 PM

## 2022-12-18 ENCOUNTER — Other Ambulatory Visit: Payer: Self-pay | Admitting: Cardiovascular Disease

## 2022-12-18 DIAGNOSIS — I319 Disease of pericardium, unspecified: Secondary | ICD-10-CM

## 2022-12-21 ENCOUNTER — Ambulatory Visit: Payer: 59 | Admitting: Orthopaedic Surgery

## 2022-12-21 ENCOUNTER — Encounter: Payer: Self-pay | Admitting: Orthopaedic Surgery

## 2022-12-21 DIAGNOSIS — M25562 Pain in left knee: Secondary | ICD-10-CM | POA: Diagnosis not present

## 2022-12-21 DIAGNOSIS — G8929 Other chronic pain: Secondary | ICD-10-CM

## 2022-12-21 DIAGNOSIS — M25512 Pain in left shoulder: Secondary | ICD-10-CM | POA: Diagnosis not present

## 2022-12-21 DIAGNOSIS — M25561 Pain in right knee: Secondary | ICD-10-CM

## 2022-12-21 MED ORDER — LIDOCAINE HCL 1 % IJ SOLN
3.0000 mL | INTRAMUSCULAR | Status: AC | PRN
  Administered 2022-12-21: 3 mL

## 2022-12-21 MED ORDER — METHYLPREDNISOLONE ACETATE 40 MG/ML IJ SUSP
40.0000 mg | INTRAMUSCULAR | Status: AC | PRN
  Administered 2022-12-21: 40 mg via INTRA_ARTICULAR

## 2022-12-21 NOTE — Progress Notes (Signed)
The patient comes in today after having physical therapy on his left shoulder.  He does have a partial rotator cuff tear and some significant tendinosis of the long head of the biceps tendon.  He also has an os acromiale.  We have tried steroid injections in the shoulder and he did state that physical therapy is helping some with the shoulder.  We have seen him for his knees for some time now.  He does have medial compartment arthritis of both knees.  We have performed arthroscopic surgery on the right knee remotely.  He is requesting a steroid injection in both knees today.  He last had steroid injections in December of last year.  I did place a steroid injection of both knees today per his request.  He does have varus malalignment of these knees that is easily correctable.  Both knees are ligamentously stable and no significant patellofemoral disease.  I did give him the name of one of my colleagues in town who performs partial knee arthroplasty surgery.  The patient is only 42 years old and he may be a candidate for that type of surgery if he gets to where his pain is detrimentally affecting his mobility, his quality of life and activities day living with his knees and conservative treatment is failing.  He understands this fully and otherwise can follow-up as needed unless things worsen.      Procedure Note  Patient: Nathaniel Jones             Date of Birth: 10-15-1980           MRN: [id number]             Visit Date: 12/21/2022  Procedures: Visit Diagnoses:  1. Chronic left shoulder pain   2. Chronic pain of right knee   3. Chronic pain of left knee     Large Joint Inj: R knee on 12/21/2022 4:56 PM Indications: diagnostic evaluation and pain Details: 22 G 1.5 in needle, superolateral approach  Arthrogram: No  Medications: 3 mL lidocaine 1 %; 40 mg methylPREDNISolone acetate 40 MG/ML Outcome: tolerated well, no immediate complications Procedure, treatment alternatives, risks and  benefits explained, specific risks discussed. Consent was given by the patient. Immediately prior to procedure a time out was called to verify the correct patient, procedure, equipment, support staff and site/side marked as required. Patient was prepped and draped in the usual sterile fashion.    Large Joint Inj: L knee on 12/21/2022 4:56 PM Indications: diagnostic evaluation and pain Details: 22 G 1.5 in needle, superolateral approach  Arthrogram: No  Medications: 3 mL lidocaine 1 %; 40 mg methylPREDNISolone acetate 40 MG/ML Outcome: tolerated well, no immediate complications Procedure, treatment alternatives, risks and benefits explained, specific risks discussed. Consent was given by the patient. Immediately prior to procedure a time out was called to verify the correct patient, procedure, equipment, support staff and site/side marked as required. Patient was prepped and draped in the usual sterile fashion.

## 2022-12-28 ENCOUNTER — Encounter: Payer: Self-pay | Admitting: Physical Therapy

## 2022-12-28 ENCOUNTER — Ambulatory Visit (INDEPENDENT_AMBULATORY_CARE_PROVIDER_SITE_OTHER): Payer: 59 | Admitting: Physical Therapy

## 2022-12-28 DIAGNOSIS — M6281 Muscle weakness (generalized): Secondary | ICD-10-CM

## 2022-12-28 DIAGNOSIS — G8929 Other chronic pain: Secondary | ICD-10-CM

## 2022-12-28 DIAGNOSIS — R293 Abnormal posture: Secondary | ICD-10-CM | POA: Diagnosis not present

## 2022-12-28 DIAGNOSIS — M25512 Pain in left shoulder: Secondary | ICD-10-CM | POA: Diagnosis not present

## 2022-12-28 NOTE — Therapy (Signed)
OUTPATIENT PHYSICAL THERAPY TREATMENT   Patient Name: Nathaniel Jones MRN: [id number] DOB:October 25, 1980, 42 y.o., male Today's Date: 12/28/2022  END OF SESSION:  PT End of Session - 12/28/22 1602     Visit Number 3    Number of Visits 6    Date for PT Re-Evaluation 01/23/23    Authorization Type [employer] Other    PT Start Time 1602    PT Stop Time 1640    PT Time Calculation (min) 38 min    Activity Tolerance Patient tolerated treatment well    Behavior During Therapy WFL for tasks assessed/performed              Past Medical History:  Diagnosis Date   Arthritis of both knees    Back pain    Chest pain    Constipation    Depression    Food allergy    coconut   Hypertension    Joint pain    Low back pain    herniated disc   Lumbar herniated disc    Obesity    OSA (obstructive sleep apnea)    has not yet received CPAP   Other fatigue    Pericarditis    in his 75s   Prediabetes    Shortness of breath    Shortness of breath on exertion    Past Surgical History:  Procedure Laterality Date   KNEE SURGERY Right 2016   Patient Active Problem List   Diagnosis Date Noted   Gout 04/15/2022   Hyperglycemia 01/26/2022   CAP (community acquired pneumonia) 10/31/2020   Pericarditis, viral 10/31/2020   Essential hypertension 10/31/2020   OSA (obstructive sleep apnea) 10/31/2020   Sleep disturbance 10/15/2020   Rectal bleeding 10/15/2020   Primary generalized (osteo)arthritis 10/15/2020   Prediabetes 10/15/2020   Obesity 10/15/2020   Neck pain 10/15/2020   Muscle contraction headache 10/15/2020   Decreased testosterone level 10/15/2020   Suspected COVID-19 virus infection 12/24/2019   Unilateral primary osteoarthritis, left knee 05/17/2018   Unilateral primary osteoarthritis, right knee 05/17/2018   Chronic pain of both knees 05/17/2018   Biceps rupture, proximal, right, initial encounter 05/22/2017   Chronic bilateral low back pain without sciatica  03/08/2017   Lateral epicondylitis, right elbow 03/08/2017   It band syndrome, left 03/08/2017    PCP: Alroy Dust, L.Marlou Sa, MD  REFERRING PROVIDER: Mcarthur Rossetti, MD  REFERRING DIAG: [phone] (ICD-10-CM) - Chronic left shoulder pain  THERAPY DIAG:  Chronic left shoulder pain  Muscle weakness (generalized)  Abnormal posture  Rationale for Evaluation and Treatment: Rehabilitation  ONSET DATE: 3 month onset of pain  SUBJECTIVE:  SUBJECTIVE STATEMENT: He indicated his shoulder is feeling better, he would like to have DN again.   PERTINENT HISTORY: PMH: HTN, prediabetes, chronic LBP  PAIN:  NPRS scale:1-2/10.  Pain location: anterior and posterior left shoulder Pain description: dull ache Aggravating factors: reaching up or back or across, driving Relieving factors: injection, propping his arm up on pillows  PRECAUTIONS: None  WEIGHT BEARING RESTRICTIONS: No  FALLS:  Has patient fallen in last 6 months? No  OCCUPATION: Geneticist, molecular, actor  PLOF: Independent  PATIENT GOALS: Get shoulder back to normal to work out again, play basketball   OBJECTIVE:   DIAGNOSTIC FINDINGS:  Eval review:    IMPRESSION: 1. Tiny, thin, linear fluid bright partial-thickness insertional midsubstance tear within the distal posterior supraspinatus tendon footprint. 2. Mild-to-moderate anterior greater than posterior infraspinatus tendinosis. 3. Moderate to high-grade proximal long head of the biceps tendinosis with small midsubstance longitudinal partial-thickness biceps tendon tear within the proximal aspect of the bicipital groove. 4. Persistent os acromiale with mild fluid across the synchondrosis and associated degenerative changes. Mild acromioclavicular osteoarthritis. 5.  Mild-to-moderate glenohumeral cartilage degenerative changes.  PATIENT SURVEYS:  Eval: FOTO 49%, goal 68%  COGNITION: Eval: Overall cognitive status: Within functional limits for tasks assessed  POSTURE:   UPPER EXTREMITY ROM:   Active ROM Right eval Left eval  Shoulder flexion  160  Shoulder extension    Shoulder abduction  145  Shoulder adduction    Shoulder internal rotation functional behind back  T11 L2*  Shoulder external rotation functional  T3 T1*  Elbow flexion    Elbow extension    Wrist flexion    Wrist extension    Wrist ulnar deviation    Wrist radial deviation    Wrist pronation    Wrist supination    (Blank rows = not tested)  UPPER EXTREMITY MMT:  MMT Right eval Left eval Left 12/14/2022  Shoulder flexion 5 4* 5/5  Shoulder extension  4   Shoulder abduction 5 4 4+/5  Shoulder adduction     Shoulder internal rotation 5 5 5/5  Shoulder external rotation 5 4+* 4+/5  Middle trapezius  4-   Lower trapezius  4-   Elbow flexion 5 4+ 5/5  Elbow extension 5 4 5/5  Wrist flexion     Wrist extension     Wrist ulnar deviation     Wrist radial deviation     Wrist pronation     Wrist supination     Grip strength (lbs) 5 5   (Blank rows = not tested)  JOINT MOBILITY TESTING:  PAMs: WNL  PALPATION:  TTP: supraspinatus tendon, bicep tendon  TODAY'S TREATMENT:  12/14/2022: Manual:  Compression to Lt infraspinatus, palpation during dry needling  Trigger Point Dry-Needling  Treatment instructions: Expect mild to moderate muscle soreness. S/S of pneumothorax if dry needled over a lung field, and to seek immediate medical attention should they occur. Patient verbalized understanding of these instructions and education.  Patient Consent Given: Yes Education handout provided: Yes Muscles treated: Lt infraspinatus, supraspinatus Treatment response/outcome: Twitch response noted c concordant symptoms indicated for Lt shoulder  Therex: UBE UE only 3  mins fwd/ back each way lvl 3.0 for ROM Cross arm stretch 15 sec x 3 Lt arm Standing blue band GH ext bilaterally 2 x 15 Standing blue band Lt shoulder ER c towel under arm 2 x 10 Standing blue band ER c flexion punch 2 x 10 , performed bilaterally Red Pball on wall 90 deg  flexion circles cw, ccw 30 x 1  each way Lat pull machine 45# 2X15 Row machine 65# 2X15 Chest press machine 35# 2X15  12/14/2022: Manual:  Compression to Lt infraspinatus, palpation during dry needling  Trigger Point Dry-Needling  Treatment instructions: Expect mild to moderate muscle soreness. S/S of pneumothorax if dry needled over a lung field, and to seek immediate medical attention should they occur. Patient verbalized understanding of these instructions and education.  Patient Consent Given: Yes Education handout provided: Yes Muscles treated: Lt infraspinatus Treatment response/outcome: Twitch response noted c concordant symptoms indicated for Lt shoulder  Therex: UBE UE only 4 mins fwd/ back each way lvl 3.0 for ROM Cross arm stretch 15 sec x 3 Lt arm Standing blue band rows bilaterally 2 x 15 Standing blue band GH ext bilaterally 2 x 15 Standing blue band Lt shoulder ER c towel under arm 2 x 10 Standing blue band ER c flexion punch 2 x 10 , performed bilaterally Red Pball on wall 90 deg flexion circles cw, ccw 30 x 1  each way   11/28/2022 HEP creation and review with demonstration and trial set preformed, see below for details  Ionto patch # 1, 1.0 CC dexamethasone, 4-6 hour wear home patch to anterior left shoulder   PATIENT EDUCATION: Education details: HEP, PT plan of care Person educated: Patient Education method: Explanation, Demonstration, Verbal cues, and Handouts Education comprehension: verbalized understanding and needs further education   HOME EXERCISE PROGRAM: Access Code: [email or web address] URL: [email or web address] Date: 11/28/2022 Prepared by: Elsie Ra  Exercises -  Standing Shoulder Internal Rotation Stretch with Towel (Mirrored)  - 2 x daily - 6 x weekly - 1 sets - 10 reps - 5 sec hold - Prone Single Arm Shoulder Y (Mirrored)  - 2 x daily - 6 x weekly - 1-3 sets - 10 reps - 2-3 sec hold - Prone Shoulder Horizontal Abduction (Mirrored)  - 2 x daily - 3-4 x weekly - 1-3 sets - 10 reps - 3 sec hold - Prone Shoulder Extension - Single Arm (Mirrored)  - 2 x daily - 3-4 x weekly - 1-3 sets - 10 reps - 3 sec hold - Shoulder External Rotation with Anchored Resistance  - 2 x daily - 6 x weekly - 2-3 sets - 10 reps - Shoulder Internal Rotation with Resistance  - 2 x daily - 6 x weekly - 2-3 sets - 10 reps - Standing Shoulder Row with Anchored Resistance  - 2 x daily - 6 x weekly - 2-3 sets - 10 reps - Wall Push Up  - 2 x daily - 6 x weekly - 1-3 sets - 10 reps  ASSESSMENT:  CLINICAL IMPRESSION:  He had good response from DN so this was continued today along with gradual strength progressions and he should be able to return to gym activity as he had good tolerance to it today, he just needs to do lighter weight for now.   OBJECTIVE IMPAIRMENTS: decreased activity tolerance, decreased shoulder mobility, decreased ROM, decreased strength, impaired flexibility, impaired UE use, postural dysfunction, and pain.  ACTIVITY LIMITATIONS: reaching, lifting, carry,  cleaning, driving, and or occupation  PERSONAL FACTORS:  also affecting patient's functional outcome.  REHAB POTENTIAL: Good  CLINICAL DECISION MAKING: Stable/uncomplicated  EVALUATION COMPLEXITY: Low    GOALS: Short term PT Goals Target date: 12/26/2022   Pt will be I and compliant with HEP. Baseline:  Goal status: Met Pt will decrease pain by 25% overall Baseline: Goal status: partially  met  Long term PT goals Target date:01/23/2023   Pt will improve left shoulder to WNL to improve functional reaching Baseline: Goal status: New Pt will improve  left  shoulder strength to at least 4+/5 MMT to  improve functional strength Baseline: Goal status: New Pt will improve FOTO to at least 68% functional to show improved function Baseline: Goal status: New Pt will reduce pain to overall less than 1/10 with usual activity and work activity. Baseline: Goal status: New  PLAN: PT FREQUENCY: 1 time every 2 weeks  PT DURATION: 12 weeks  PLANNED INTERVENTIONS (unless contraindicated): aquatic PT, Canalith repositioning, cryotherapy, Electrical stimulation, Iontophoresis with 4 mg/ml dexamethasome, Moist heat, traction, Ultrasound, gait training, Therapeutic exercise, balance training, neuromuscular re-education, patient/family education, prosthetic training, manual techniques, passive ROM, dry needling, taping, vasopnuematic device, vestibular, spinal manipulations, joint manipulations  PLAN FOR NEXT SESSION: Check on dry needling response. Strength progressions as able.  Elsie Ra, PT, DPT 12/28/22 4:27 PM

## 2023-01-11 ENCOUNTER — Encounter: Payer: 59 | Admitting: Physical Therapy

## 2023-01-11 ENCOUNTER — Telehealth: Payer: Self-pay | Admitting: Physical Therapy

## 2023-01-11 NOTE — Telephone Encounter (Signed)
Pt did not show for PT appointment today. They were contacted and he states he had forgotten about his appointment. I reminded him of his next appointment time and date.  Ivery Quale, PT, DPT 01/11/23 4:20 PM

## 2023-01-25 ENCOUNTER — Encounter: Payer: 59 | Admitting: Physical Therapy

## 2023-02-01 ENCOUNTER — Encounter: Payer: 59 | Admitting: Physical Therapy

## 2023-02-02 ENCOUNTER — Ambulatory Visit: Payer: 59 | Admitting: Physical Therapy

## 2023-02-02 ENCOUNTER — Encounter: Payer: Self-pay | Admitting: Physical Therapy

## 2023-02-02 DIAGNOSIS — R293 Abnormal posture: Secondary | ICD-10-CM

## 2023-02-02 DIAGNOSIS — M6281 Muscle weakness (generalized): Secondary | ICD-10-CM | POA: Diagnosis not present

## 2023-02-02 DIAGNOSIS — G8929 Other chronic pain: Secondary | ICD-10-CM | POA: Diagnosis not present

## 2023-02-02 DIAGNOSIS — M25512 Pain in left shoulder: Secondary | ICD-10-CM | POA: Diagnosis not present

## 2023-02-02 NOTE — Therapy (Addendum)
OUTPATIENT PHYSICAL THERAPY TREATMENT /DISCHARGE   Patient Name: Nathaniel Jones MRN: [id number] DOB:[birth year], 42 y.o., male Today's Date: 02/02/2023  END OF SESSION:  PT End of Session - 02/02/23 1618     Visit Number 4    Number of Visits 6    Authorization Type [employer] Other    PT Start Time 1605    PT Stop Time 1643    PT Time Calculation (min) 38 min    Activity Tolerance Patient tolerated treatment well    Behavior During Therapy WFL for tasks assessed/performed              Past Medical History:  Diagnosis Date   Arthritis of both knees    Back pain    Chest pain    Constipation    Depression    Food allergy    coconut   Hypertension    Joint pain    Low back pain    herniated disc   Lumbar herniated disc    Obesity    OSA (obstructive sleep apnea)    has not yet received CPAP   Other fatigue    Pericarditis    in his 44s   Prediabetes    Shortness of breath    Shortness of breath on exertion    Past Surgical History:  Procedure Laterality Date   KNEE SURGERY Right 2016   Patient Active Problem List   Diagnosis Date Noted   Gout 04/15/2022   Hyperglycemia 01/26/2022   CAP (community acquired pneumonia) 10/31/2020   Pericarditis, viral 10/31/2020   Essential hypertension 10/31/2020   OSA (obstructive sleep apnea) 10/31/2020   Sleep disturbance 10/15/2020   Rectal bleeding 10/15/2020   Primary generalized (osteo)arthritis 10/15/2020   Prediabetes 10/15/2020   Obesity 10/15/2020   Neck pain 10/15/2020   Muscle contraction headache 10/15/2020   Decreased testosterone level 10/15/2020   Suspected COVID-19 virus infection 12/24/2019   Unilateral primary osteoarthritis, left knee 05/17/2018   Unilateral primary osteoarthritis, right knee 05/17/2018   Chronic pain of both knees 05/17/2018   Biceps rupture, proximal, right, initial encounter 05/22/2017   Chronic bilateral low back pain without sciatica 03/08/2017   Lateral  epicondylitis, right elbow 03/08/2017   It band syndrome, left 03/08/2017    PCP: [name], [name], MD  REFERRING PROVIDER: Kathryne Hitch, MD  REFERRING DIAG: [phone] (ICD-10-CM) - Chronic left shoulder pain  THERAPY DIAG:  Chronic left shoulder pain  Muscle weakness (generalized)  Abnormal posture  Rationale for Evaluation and Treatment: Rehabilitation  ONSET DATE: 3 month onset of pain  SUBJECTIVE:  SUBJECTIVE STATEMENT: He indicated his shoulder is feeling better, he would like to have DN again.   PERTINENT HISTORY: PMH: HTN, prediabetes, chronic LBP  PAIN:  NPRS scale:1-2/10.  Pain location: anterior and posterior left shoulder Pain description: dull ache Aggravating factors: reaching up or back or across, driving Relieving factors: injection, propping his arm up on pillows  PRECAUTIONS: None  WEIGHT BEARING RESTRICTIONS: No  FALLS:  Has patient fallen in last 6 months? No  OCCUPATION: Child psychotherapist, actor  PLOF: Independent  PATIENT GOALS: Get shoulder back to normal to work out again, play basketball   OBJECTIVE:   DIAGNOSTIC FINDINGS:  Eval review:    IMPRESSION: 1. Tiny, thin, linear fluid bright partial-thickness insertional midsubstance tear within the distal posterior supraspinatus tendon footprint. 2. Mild-to-moderate anterior greater than posterior infraspinatus tendinosis. 3. Moderate to high-grade proximal long head of the biceps tendinosis with small midsubstance longitudinal partial-thickness biceps tendon tear within the proximal aspect of the bicipital groove. 4. Persistent os acromiale with mild fluid across the synchondrosis and associated degenerative changes. Mild acromioclavicular osteoarthritis. 5. Mild-to-moderate glenohumeral  cartilage degenerative changes.  PATIENT SURVEYS:  Eval: FOTO 49%, goal 68%  COGNITION: Eval: Overall cognitive status: Within functional limits for tasks assessed  POSTURE:   UPPER EXTREMITY ROM:   Active ROM Right eval Left eval  Shoulder flexion  160  Shoulder extension    Shoulder abduction  145  Shoulder adduction    Shoulder internal rotation functional behind back  T11 L2*  Shoulder external rotation functional  T3 T1*  Elbow flexion    Elbow extension    Wrist flexion    Wrist extension    Wrist ulnar deviation    Wrist radial deviation    Wrist pronation    Wrist supination    (Blank rows = not tested)  UPPER EXTREMITY MMT:  MMT Right eval Left eval Left 12/14/2022  Shoulder flexion 5 4* 5/5  Shoulder extension  4   Shoulder abduction 5 4 4+/5  Shoulder adduction     Shoulder internal rotation 5 5 5/5  Shoulder external rotation 5 4+* 4+/5  Middle trapezius  4-   Lower trapezius  4-   Elbow flexion 5 4+ 5/5  Elbow extension 5 4 5/5  Wrist flexion     Wrist extension     Wrist ulnar deviation     Wrist radial deviation     Wrist pronation     Wrist supination     Grip strength (lbs) 5 5   (Blank rows = not tested)  JOINT MOBILITY TESTING:  PAMs: WNL  PALPATION:  TTP: supraspinatus tendon, bicep tendon  TODAY'S TREATMENT:  02/02/2023: Manual:  Compression and skilled palpation during dry needling  Trigger Point Dry-Needling  Treatment instructions: Expect mild to moderate muscle soreness. S/S of pneumothorax if dry needled over a lung field, and to seek immediate medical attention should they occur. Patient verbalized understanding of these instructions and education.  Patient Consent Given: Yes Education handout provided: Yes Muscles treated: Lt infraspinatus, supraspinatus, posterior deltoid Treatment response/outcome: Twitch response noted c concordant symptoms indicated for Lt shoulder  Therex: UBE UE only 2.5 mins fwd/ back each  way lvl 5.0 for ROM Cross arm stretch 15 sec x 3 Lt arm Standing cable ER 10# 3X10 Standing cable IR 15# 2X15 Standing cable extensions 25# 2X15 Standing cable abductions 10# 2X15 Face pulls with rope at cable machine 25# 2X15 Red Pball on wall 90 deg flexion circles cw, ccw 30  x 1  each way Lat pull machine 65# 2X15 Row machine 85# 2X15 Chest press machine 45# 2X15  12/14/2022: Manual:  Compression to Lt infraspinatus, palpation during dry needling  Trigger Point Dry-Needling  Treatment instructions: Expect mild to moderate muscle soreness. S/S of pneumothorax if dry needled over a lung field, and to seek immediate medical attention should they occur. Patient verbalized understanding of these instructions and education.  Patient Consent Given: Yes Education handout provided: Yes Muscles treated: Lt infraspinatus Treatment response/outcome: Twitch response noted c concordant symptoms indicated for Lt shoulder  Therex: UBE UE only 4 mins fwd/ back each way lvl 3.0 for ROM Cross arm stretch 15 sec x 3 Lt arm Standing blue band rows bilaterally 2 x 15 Standing blue band GH ext bilaterally 2 x 15 Standing blue band Lt shoulder ER c towel under arm 2 x 10 Standing blue band ER c flexion punch 2 x 10 , performed bilaterally Red Pball on wall 90 deg flexion circles cw, ccw 30 x 1  each way   11/28/2022 HEP creation and review with demonstration and trial set preformed, see below for details  Ionto patch # 1, 1.0 CC dexamethasone, 4-6 hour wear home patch to anterior left shoulder   PATIENT EDUCATION: Education details: HEP, PT plan of care Person educated: Patient Education method: Explanation, Demonstration, Verbal cues, and Handouts Education comprehension: verbalized understanding and needs further education   HOME EXERCISE PROGRAM: Access Code: [email or web address] URL: [email or web address] Date: 11/28/2022 Prepared by: Ivery Quale  Exercises - Standing Shoulder  Internal Rotation Stretch with Towel (Mirrored)  - 2 x daily - 6 x weekly - 1 sets - 10 reps - 5 sec hold - Prone Single Arm Shoulder Y (Mirrored)  - 2 x daily - 6 x weekly - 1-3 sets - 10 reps - 2-3 sec hold - Prone Shoulder Horizontal Abduction (Mirrored)  - 2 x daily - 3-4 x weekly - 1-3 sets - 10 reps - 3 sec hold - Prone Shoulder Extension - Single Arm (Mirrored)  - 2 x daily - 3-4 x weekly - 1-3 sets - 10 reps - 3 sec hold - Shoulder External Rotation with Anchored Resistance  - 2 x daily - 6 x weekly - 2-3 sets - 10 reps - Shoulder Internal Rotation with Resistance  - 2 x daily - 6 x weekly - 2-3 sets - 10 reps - Standing Shoulder Row with Anchored Resistance  - 2 x daily - 6 x weekly - 2-3 sets - 10 reps - Wall Push Up  - 2 x daily - 6 x weekly - 1-3 sets - 10 reps  ASSESSMENT:  CLINICAL IMPRESSION:  He had good response to DN and gym activity. He met his FOTO score goal showing improved functional use of left UE. He wants to keep his PT episode of care open for now and will follow up with PT PRN.  OBJECTIVE IMPAIRMENTS: decreased activity tolerance, decreased shoulder mobility, decreased ROM, decreased strength, impaired flexibility, impaired UE use, postural dysfunction, and pain.  ACTIVITY LIMITATIONS: reaching, lifting, carry,  cleaning, driving, and or occupation  PERSONAL FACTORS:  also affecting patient's functional outcome.  REHAB POTENTIAL: Good  CLINICAL DECISION MAKING: Stable/uncomplicated  EVALUATION COMPLEXITY: Low    GOALS: Short term PT Goals Target date: 12/26/2022   Pt will be I and compliant with HEP. Baseline:  Goal status: Met Pt will decrease pain by 25% overall Baseline: Goal status: MET   Long term PT goals Target date:01/23/2023  Pt will improve left shoulder to WNL to improve functional reaching Baseline: Goal status: MET Pt will improve  left  shoulder strength to at least 4+/5 MMT to improve functional strength Baseline: Goal status:  MET Pt will improve FOTO to at least 68% functional to show improved function Baseline: Goal status: MET Pt will reduce pain to overall less than 1/10 with usual activity and work activity. Baseline: Goal status: ongoing  PLAN: PT FREQUENCY: 1 time every 2 weeks  PT DURATION: 12 weeks  PLANNED INTERVENTIONS (unless contraindicated): aquatic PT, Canalith repositioning, cryotherapy, Electrical stimulation, Iontophoresis with 4 mg/ml dexamethasome, Moist heat, traction, Ultrasound, gait training, Therapeutic exercise, balance training, neuromuscular re-education, patient/family education, prosthetic training, manual techniques, passive ROM, dry needling, taping, vasopnuematic device, vestibular, spinal manipulations, joint manipulations  PLAN FOR NEXT SESSION: Check on dry needling response. Strength progressions as able and transition to independent program as he feels ready.  Ivery Quale, PT, DPT 02/02/23 4:19 PM   PHYSICAL THERAPY DISCHARGE SUMMARY  Visits from Start of Care: 4  Current functional level related to goals / functional outcomes: See note   Remaining deficits: See note   Education / Equipment: HEP  Patient goals were  mostly met . Patient is being discharged due to not returning since the last visit.  Chyrel Masson, PT, DPT, OCS, ATC 03/23/23  3:28 PM

## 2023-02-13 ENCOUNTER — Encounter: Payer: 59 | Admitting: Physical Therapy

## 2023-02-13 ENCOUNTER — Telehealth: Payer: Self-pay | Admitting: Physical Therapy

## 2023-02-13 NOTE — Telephone Encounter (Signed)
Pt did not show for PT appointment today. They were contacted and informed of this via voicemail. This is the last visit he has scheduled so he was instructed to call us back to set up another appointment if he feels he still needs additional PT.  Ivery Quale, PT, DPT 02/13/23 4:19 PM

## 2023-02-15 ENCOUNTER — Other Ambulatory Visit: Payer: Self-pay | Admitting: Cardiovascular Disease

## 2023-04-24 ENCOUNTER — Ambulatory Visit: Payer: 59 | Admitting: Physician Assistant

## 2023-04-24 ENCOUNTER — Other Ambulatory Visit (INDEPENDENT_AMBULATORY_CARE_PROVIDER_SITE_OTHER): Payer: 59

## 2023-04-24 DIAGNOSIS — M25561 Pain in right knee: Secondary | ICD-10-CM | POA: Diagnosis not present

## 2023-04-24 DIAGNOSIS — M545 Low back pain, unspecified: Secondary | ICD-10-CM | POA: Diagnosis not present

## 2023-04-24 DIAGNOSIS — G8929 Other chronic pain: Secondary | ICD-10-CM | POA: Diagnosis not present

## 2023-04-24 DIAGNOSIS — M25562 Pain in left knee: Secondary | ICD-10-CM | POA: Diagnosis not present

## 2023-04-24 MED ORDER — NAPROXEN 500 MG PO TABS: 500.0000 mg | ORAL_TABLET | Freq: Two times a day (BID) | ORAL | 2 refills | Status: DC

## 2023-04-24 NOTE — Addendum Note (Signed)
Addended by: Richardean Canal on: 04/24/2023 05:31 PM   Modules accepted: Orders

## 2023-04-24 NOTE — Progress Notes (Signed)
HPI: Nathaniel Jones comes in today with chief complaint of bilateral knee pain and low back pain.  He has known arthritis of both knees.  Currently his right knee pain is severe and his left knee pain is mild.  He last had cortisone injections both knees in April by Dr. Magnus Ivan and did well until recently.  He has had no new injury to either knee.  He has been doing a lot of traveling with his son who plays travel ball has been traveling to [place] and other states feels that this been hard on his knees.  Low back pain started after a friend jumped into his arms and he caught her.  He felt like something popped in his low back.  This happened a few weeks ago.  He feels like he is overall trending towards improvement.  Denies any radicular symptoms down either leg.  Denies any current waking pain, bowel or bladder dysfunction, saddle anesthesia or weight loss.  He does continue to have some low back pain.  Has been taking ibuprofen and Tylenol for his back and knees this helps his back but does not really help with the knee pain.  He has taken naproxen in the past which has helped with his knees but he is currently out of this.  Review of systems: See HPI otherwise negative   Physical exam: General well-developed well-nourished male in no acute distress ambulates without any assistive device. Lower extremities: Negative straight leg raise bilaterally.  Bilateral knees good range of motion both knees no abnormal warmth erythema or effusion.  Tenderness left knee medial joint line.  Right knee has tenderness along medial and lateral joint line.  No instability valgus varus stressing of either knee.  Radiographs: Left knee: Tricompartmental arthritis.  Bone-on-bone medial compartment.  Severe patellofemoral arthritic changes.  Mild lateral compartmental arthritic changes with periarticular spurring.  No acute fractures findings.  Right knee: Tricompartmental arthritic changes.  Near bone-on-bone medial compartment.   Narrowing lateral compartment with periarticular spurring.  Moderate to severe patellofemoral arthritic changes.  Knee is well located.  No acute fractures or acute findings.  Lumbar spine 2 views: No acute fracture.  Slight loss of disc space at L3-4.  Otherwise no spondylolisthesis.  Normal lordotic curvature.  Impression: Low back pain without radiculopathy  Plan: Will place him on naproxen.  He is agreeable with repeating cortisone injections both knees today.  He is to stop his ibuprofen.  He will follow-up with [place] as needed.  Questions encouraged and answered at length.

## 2023-05-04 ENCOUNTER — Ambulatory Visit: Payer: 59 | Admitting: Physician Assistant

## 2023-05-14 ENCOUNTER — Emergency Department (HOSPITAL_BASED_OUTPATIENT_CLINIC_OR_DEPARTMENT_OTHER): Payer: 59

## 2023-05-14 ENCOUNTER — Emergency Department (HOSPITAL_BASED_OUTPATIENT_CLINIC_OR_DEPARTMENT_OTHER)
Admission: EM | Admit: 2023-05-14 | Discharge: 2023-05-15 | Disposition: A | Discharge status: Home / Self Care | Payer: 59 | Attending: Emergency Medicine | Admitting: Emergency Medicine

## 2023-05-14 ENCOUNTER — Other Ambulatory Visit: Payer: Self-pay

## 2023-05-14 ENCOUNTER — Encounter (HOSPITAL_BASED_OUTPATIENT_CLINIC_OR_DEPARTMENT_OTHER): Payer: Self-pay

## 2023-05-14 DIAGNOSIS — Z20822 Contact with and (suspected) exposure to covid-19: Secondary | ICD-10-CM | POA: Diagnosis not present

## 2023-05-14 DIAGNOSIS — M940 Chondrocostal junction syndrome [Tietze]: Secondary | ICD-10-CM | POA: Diagnosis not present

## 2023-05-14 DIAGNOSIS — R0789 Other chest pain: Secondary | ICD-10-CM | POA: Diagnosis present

## 2023-05-14 DIAGNOSIS — Z79899 Other long term (current) drug therapy: Secondary | ICD-10-CM | POA: Insufficient documentation

## 2023-05-14 NOTE — ED Provider Notes (Signed)
  [facility] AT [facility] Provider Note   CSN: [id number] Arrival date & time: 05/14/23  2027     History {Add pertinent medical, surgical, social history, OB history to HPI:1} Chief Complaint  Patient presents with   Back Pain    Nathaniel Jones is a 42 y.o. male.   Back Pain      Home Medications Prior to Admission medications   Medication Sig Start Date End Date Taking? Authorizing Provider  allopurinol (ZYLOPRIM) 100 MG tablet Take 100 mg by mouth daily. 06/05/20   [provider]  amLODipine (NORVASC) 5 MG tablet TAKE 1 TABLET (5 MG TOTAL) BY MOUTH DAILY. 09/05/22   [name], MD  colchicine 0.6 MG tablet TAKE 1 TABLET BY MOUTH 2 TIMES DAILY. 08/12/22   [name], MD  metoprolol succinate (TOPROL-XL) 50 MG 24 hr tablet TAKE 1 TABLET BY MOUTH EVERY DAY WITH OR IMMEDIATELY FOLLOWING A MEAL 02/15/23   [name], MD  naproxen (NAPROSYN) 500 MG tablet Take 1 tablet (500 mg total) by mouth 2 (two) times daily. 04/24/23   Kirtland Bouchard, PA-C  pantoprazole (PROTONIX) 40 MG tablet Take 1 tablet (40 mg total) by mouth daily. 12/22/22   [name], MD  QUEtiapine (SEROQUEL) 25 MG tablet Take 25 mg by mouth at bedtime.    [provider]      Allergies    Coconut (cocos nucifera)    Review of Systems   Review of Systems  Musculoskeletal:  Positive for back pain.    Physical Exam Updated Vital Signs BP (!) 149/98 (BP Location: Right Arm)   Pulse 67   Temp 98.7 F (37.1 C) (Oral)   Resp 16   Ht 6\' 4"  (1.93 m)   Wt (!) 142.9 kg   SpO2 100%   BMI 38.34 kg/m  Physical Exam  ED Results / Procedures / Treatments   Labs (all labs ordered are listed, but only abnormal results are displayed) Labs Reviewed - No data to display  EKG None  Radiology No results found.  Procedures Procedures  {Document cardiac monitor, telemetry assessment procedure when appropriate:1}  Medications Ordered in  ED Medications - No data to display  ED Course/ Medical Decision Making/ A&P   {   Click here for ABCD2, HEART and other calculatorsREFRESH Note before signing :1}                              Medical Decision Making  ***  {Document critical care time when appropriate:1} {Document review of labs and clinical decision tools ie heart score, Chads2Vasc2 etc:1}  {Document your independent review of radiology images, and any outside records:1} {Document your discussion with family members, caretakers, and with consultants:1} {Document social determinants of health affecting pt's care:1} {Document your decision making why or why not admission, treatments were needed:1} Final Clinical Impression(s) / ED Diagnoses Final diagnoses:  None    Rx / DC Orders ED Discharge Orders     None

## 2023-05-14 NOTE — ED Triage Notes (Signed)
Patient reports back and neck pain that started over one week ago. Denies injury or trauma, loss of bowel or bladder, numbness, tingling.

## 2023-05-15 LAB — COMPREHENSIVE METABOLIC PANEL WITH GFR
ALT: 68 U/L — ABNORMAL HIGH (ref 0–44)
AST: 36 U/L (ref 15–41)
Albumin: 3.8 g/dL (ref 3.5–5.0)
Alkaline Phosphatase: 74 U/L (ref 38–126)
Anion gap: 8 (ref 5–15)
BUN: 14 mg/dL (ref 6–20)
CO2: 27 mmol/L (ref 22–32)
Calcium: 8.9 mg/dL (ref 8.9–10.3)
Chloride: 106 mmol/L (ref 98–111)
Creatinine, Ser: 1.08 mg/dL (ref 0.61–1.24)
GFR, Estimated: >60 mL/min (ref >60)
Glucose, Bld: 89 mg/dL (ref 70–99)
Potassium: 3.8 mmol/L (ref 3.5–5.1)
Sodium: 141 mmol/L (ref 135–145)
Total Bilirubin: 0.6 mg/dL (ref 0.3–1.2)
Total Protein: 6.9 g/dL (ref 6.5–8.1)

## 2023-05-15 LAB — D-DIMER, QUANTITATIVE: D-Dimer, Quant: <0.27 ug{FEU}/mL (ref 0.00–0.50)

## 2023-05-15 LAB — SARS CORONAVIRUS 2 BY RT PCR: SARS Coronavirus 2 by RT PCR: NEGATIVE

## 2023-05-15 MED ORDER — LIDOCAINE 5 % EX PTCH
1.0000 | MEDICATED_PATCH | CUTANEOUS | Status: DC
  Administered 2023-05-15: 1 via TRANSDERMAL
  Filled 2023-05-15: qty 1

## 2023-05-15 MED ORDER — KETOROLAC TROMETHAMINE 60 MG/2ML IM SOLN
30.0000 mg | Freq: Once | INTRAMUSCULAR | Status: AC
  Administered 2023-05-15: 30 mg via INTRAMUSCULAR
  Filled 2023-05-15: qty 2

## 2023-05-15 NOTE — ED Notes (Signed)
Sent another COVID swab for lab.

## 2023-05-15 NOTE — Discharge Instructions (Addendum)
Your EKG, chest x-ray and labs were all reassuring.  Your D-dimer was negative making a blood clot in your legs and lungs unlikely.  Your cardiac enzyme was negative.  There is no evidence of pericarditis on your EKG.  Your symptoms are consistent with likely costochondritis with tenderness along your rib cage.  Recommend NSAIDs for pain control, lidocaine patch and follow-up with your PCP.

## 2023-05-19 ENCOUNTER — Other Ambulatory Visit: Payer: Self-pay | Admitting: Family Medicine

## 2023-05-19 DIAGNOSIS — R109 Unspecified abdominal pain: Secondary | ICD-10-CM

## 2023-05-22 ENCOUNTER — Encounter: Payer: Self-pay | Admitting: Cardiovascular Disease

## 2023-05-23 ENCOUNTER — Ambulatory Visit
Admission: RE | Admit: 2023-05-23 | Discharge: 2023-05-23 | Disposition: A | Discharge status: Home / Self Care | Payer: 59 | Source: Ambulatory Visit | Attending: Family Medicine | Admitting: Family Medicine

## 2023-05-23 DIAGNOSIS — R109 Unspecified abdominal pain: Secondary | ICD-10-CM

## 2023-05-23 MED ORDER — IOPAMIDOL (ISOVUE-300) INJECTION 61%
100.0000 mL | Freq: Once | INTRAVENOUS | Status: AC | PRN
  Administered 2023-05-23: 100 mL via INTRAVENOUS

## 2023-06-07 ENCOUNTER — Other Ambulatory Visit: Payer: Self-pay | Admitting: Cardiovascular Disease

## 2023-06-07 DIAGNOSIS — I10 Essential (primary) hypertension: Secondary | ICD-10-CM

## 2023-06-07 DIAGNOSIS — G4733 Obstructive sleep apnea (adult) (pediatric): Secondary | ICD-10-CM

## 2023-06-22 ENCOUNTER — Ambulatory Visit: Payer: 59 | Attending: Cardiovascular Disease | Admitting: Cardiovascular Disease

## 2023-06-22 ENCOUNTER — Encounter: Payer: Self-pay | Admitting: Cardiovascular Disease

## 2023-06-22 VITALS — BP 126/80 | HR 59 | Ht 75.0 in | Wt 337.8 lb

## 2023-06-22 DIAGNOSIS — I319 Disease of pericardium, unspecified: Secondary | ICD-10-CM

## 2023-06-22 DIAGNOSIS — E66813 Obesity, class 3: Secondary | ICD-10-CM

## 2023-06-22 DIAGNOSIS — U071 COVID-19: Secondary | ICD-10-CM

## 2023-06-22 DIAGNOSIS — K760 Fatty (change of) liver, not elsewhere classified: Secondary | ICD-10-CM

## 2023-06-22 DIAGNOSIS — R7989 Other specified abnormal findings of blood chemistry: Secondary | ICD-10-CM | POA: Diagnosis not present

## 2023-06-22 DIAGNOSIS — R0789 Other chest pain: Secondary | ICD-10-CM

## 2023-06-22 DIAGNOSIS — R7401 Elevation of levels of liver transaminase levels: Secondary | ICD-10-CM

## 2023-06-22 MED ORDER — COLCHICINE 0.6 MG PO TABS: 0.6000 mg | ORAL_TABLET | Freq: Every day | ORAL | 3 refills | Status: AC | PRN

## 2023-06-22 NOTE — Patient Instructions (Addendum)
Medication Instructions:  No medication changes *If you need a refill on your cardiac medications before your next appointment, please call your pharmacy*   Lab Work: CMET If you have labs (blood work) drawn today and your tests are completely normal, you will receive your results only by: MyChart Message (if you have MyChart) OR A paper copy in the mail If you have any lab test that is abnormal or we need to change your treatment, we will call you to review the results.   Testing/Procedures: none   Follow-Up: At [facility], you and your health needs are our priority.  As part of our continuing mission to provide you with exceptional heart care, we have created designated Provider Care Teams.  These Care Teams include your primary Cardiologist (physician) and Advanced Practice Providers (APPs -  Physician Assistants and Nurse Practitioners) who all work together to provide you with the care you need, when you need it.  We recommend signing up for the patient portal called "MyChart".  Sign up information is provided on this After Visit Summary.  MyChart is used to connect with patients for Virtual Visits (Telemedicine).  Patients are able to view lab/test results, encounter notes, upcoming appointments, etc.  Non-urgent messages can be sent to your provider as well.   To learn more about what you can do with MyChart, go to [email or web address].    Your next appointment:   6 month(s)  Provider:   Nicki Guadalajara, MD

## 2023-06-22 NOTE — Progress Notes (Signed)
Cardiology Office Note    Date:  06/22/2023   ID:  Nathaniel Jones, DOB 04-14-1981, MRN [id number]  PCP:  Asencion Gowda.August Saucer, MD  Cardiologist:  Nicki Guadalajara, MD   7 month F/U  History of Present Illness:  Nathaniel Jones is a 42 y.o. male who I had seen in the past for evaluation of chest pain with atypical features as well as sleepiness.  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  Since his playing days when his weight was 255 pounds he has gained weight and has been around 315.  He has a history of hypertension.  In 2018 he had a normal nuclear stress test and an echo Doppler study showed an EF of 55% with moderate LVH with mild left atrial enlargement and mild TR.  He has obstructive sleep apnea and has had issues with significant excessive daytime sleepiness.  Remotely had been evaluated by Dr. Frances Furbish and was reevaluated by me on a home study in October 2021 which showed mild overall sleep apnea with an AHI of 10 but in the past he had been noted to have severe OSA during REM sleep which was unable to be assessed on the home study.  He never had a CPAP titration or AutoPap initiation.  He developed COVID-19 infection on October 02, 2020.  He presented to the emergency room on October 27, 2020 with headaches, chills myalgias and fever.  He was sent home.  He returned on January 27 with neck pain and significant headache and apparently had a spinal tap which was negative.  A chest x-ray was suspicious for possible pneumonia and he was discharged on Augmentin.  He represented the next day with left precordial chest pain and low-grade fever as well as mild cough.  He was admitted to the hospital on October 31, 2020.  CT of the chest was negative for PE and was felt he had pericarditis and possible consolidation of his right middle upper lobe.  He continued to have low fever.  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   Blood cultures were negative.  Procalcitonin was greater than 4, CRP was 40.  Repeat Covid 19 PCR test was negative.  An echo Doppler study showed an EF of 60 to 65% with grade 1 diastolic dysfunction there was no mention of any pericardial effusion.  He was treated with Rocephin and azithromycin and was recommended to take colchicine and continue allopurinol with his history of gout.  The patient was to have a repeat chest x-ray after completion of antibiotic therapy.  He was evaluated by Micah Flesher on November 18, 2020 and admitted to feeling terrible.  He had finished his antibiotic therapy for pneumonia and sinusitis and admitted to being breathless after 1 flight of steps and even taking a shower left knee is significantly fatigued.  He has not yet returned to work.  Due to diagnosis of pericarditis it was recommended he complete 3 months of colchicine therapy she started him on Relafen instead of ibuprofen but he did not notice any significant change and she had a Protonix 40 mg daily.    I saw him on December 04, 2020 at which time he continued to experience increasing shortness of breath with activity and admitted to being very weak.  He was experiencing episodes of sharp stabbing pain when he was lying down on the left chest region.  He no longer had a  fever.  He had not had a follow-up chest x-ray.  He was not sleeping well.  I recommended he undergo a PA and lateral chest x-ray as well as laboratory.  CRP was 1.  Hemoglobin and hematocrit were 12.9 and 39.5.  Creatinine was 1.09.  Erythrocyte sedimentation rate had improved and was now 18 compared to 98 in January and 40 on November 18, 2020.  A chest x-ray January 15, 2021 was negative for acute cardiopulmonary disease.  I scheduled him for cardiac MRI which was done on January 18, 2021 was essentially normal and showed normal LV chamber size with mild increased wall thickness of 12 mm.  LVEF was 60% without regional wall motion abnormalities.  There was  no evidence for myocardial edema.  There was no postcontrast delayed myocardial enhancement.  RV function was normal with an EF of 53%.  Valves were normal.  The pericardium was normal.  His aorta was mildly dilated at the level of the sinus Valsalva measuring 41 mm.  I saw him on January 18, 2021 in follow-up office visit.  At that time he felt better but had not yet returned to work.  However he was still experiencing some pain while at sleeping on his back and reason was sleeping on his left side.  He described the pain as a knifelike sensation is also noted a vague residual dullness.  During that evaluation, I recommended that he can return to work.  I felt his chest pain was nonischemic and chest wall discomfort.  He was still waiting for his machine and I recommended institution of AutoPap therapy.  I saw him as an add-on on Feb 15, 2021.  Since his previous evaluation he had  called the office on January 28, 2021 after he experienced recurrent sharp chest pain episodes after he had had a heated discussion with his supervisor at work.  He had noted that his heart rate increases when he lies on his back even before he falls asleep with heart rates in the upper 90s.  He has tried instituting AutoPap therapy.  However he is only been sleeping several hours with it.  He has been having difficulty with the full facemask that was provided to him which covers the nose and mouth.  He was added onto my schedule for follow-up evaluation.  During that evaluation, his ECG showed sinus rhythm at 72 bpm with previously noted T wave abnormality in leads III and aVF.  He was experiencing knifelike chest pain which seem to be precipitated by [place] and was taking ibuprofen 600 mg twice a day with benefit.  With his recent heart rate increase I suggested a trial of increasing metoprolol succinate from 25 to 50 mg.  I had a long discussion with him regarding his initiation of CPAP therapy.  Apparently, Mr. Awalt CPAP machine was  picked up by the DME company on August 2 due to very poor compliance and no recent usage.  He was seen by Dr. Lethea Killings on July 30, 2021 complaining of 2 to 3 weeks of intermittent chest tightness more described as a sharp pain on the right which then moved to his left chest.  It also had pleuritic component being worse with deep inspiration.  The time he was very concerned about recurrent pericarditis.  Dr. Bufford Buttner was not convinced this was pericarditis and that may be just pleurisy related to a chest cold.  However sed rate and CRP was obtained and he was started on therapy with  indomethacin 25 mg 3 times a day for 1 week.  Sedimentation rate was normal at 8 and C-reactive protein was increased at 19.  Subsequently, he was reevaluated on August 03, 2021 Azalee Course, [place] after he had called the office complaining of worsening symptoms.  He was complaining of pleuritic chest pain with sharp pain with deep inspiration and laying down which was the same pain that he had experienced with pericarditis.  At that time, he was represcribed colchicine 0.6 mg twice a day and he was wondering about getting an antibiotic but it was felt that most likely he had a nonbacterial etiology and antibiotic was not prescribed.  I saw him as an add-on in August 25, 2021.  He had been  evaluated in urgent care on November 16 due to concerns about worsening chest pain and shortness of breath.  A chest x-ray was obtained and he was started on Levaquin and prednisone for presumed walking pneumonia.  August 24, 2021 he read presented to [facility] ER at [address] [place] with shortness of breath that was waking him up from sleep at night and similar pleuritic-like chest pain.  D-dimer was negative.  White blood count was 16,800, but he was on prednisone.  Glucose had increased to 127.  COVID test was negative.  Troponins were negative.  BNP was normal at 63.4.  His blood pressure has been elevated.  He continues to experience some twinges of  chest pain when he takes a deep breath but actually the symptoms have somewhat improved since initiation of colchicine and he has continued to be on indomethacin 50 mg twice a day.  He was sleeping poorly.   During that evaluation his ECG showed sinus bradycardia 53 bpm.  He had normal PR segment.  On exam he did not have a friction rub.  His blood pressure was elevated and I felt this may have been also contributed by his rem related untreated sleep apnea.  I recommended sodium restriction and added amlodipine 5 mg for improved blood pressure control as well as spironolactone 12.5 mg daily.  I scheduled him for 2D echo Doppler study.  I recommended he continue nonsteroidal anti-inflammatory medicine and colchicine.  I discussed his untreated sleep apnea at length.  He was not a candidate for inspire with his BMI of 39.68.  We discussed reevaluation versus a dental evaluation for a customized oral appliance.  I also recommended repeat laboratory with a comprehensive metabolic panel, CBC, erythrocyte sedimentation rate, and CRP.  When I saw him in follow-up on October 07, 2021 he felt better but still was fatigued.   His echo Doppler study on September 02, 2021 showed an EF of 55 to 60% with normal wall motion.  There was mild biatrial enlargement.  Pericardium was normal without evidence for effusion.  Laboratory revealed a CRP of 2, normal chemistry and sed rate of 7.  CBC was normal with white blood count of 16.8 which had improved from November 22 when it was 16.8.  He feels better.    I saw him on Feb 16, 2022. Over the last several months, he had felt well but 3 weeks ago he had an upper respiratory tract infection.  This was associated with some chest pain in his chest and across his back which seem more prominent when laying down.  He again developed some lethargy and felt fatigued.  He denies any current cough.  He continued to be on colchicine 0.6 mg twice a day he resumed taking  spironolactone 12.5 mg for  the last week.  He continued to be on metoprolol succinate 50 mg daily and amlodipine 5 mg.  He was taking ibuprofen 400 mg.  During that evaluation his ECG was stable and he had a normal PR segment and no ST segment changes.  I recommended repeat laboratory and follow-up echocardiographic evaluation.  Laboratory on Feb 16, 2022 showed a erythrocyte sedimentation rate at 17.  CRP was 2.  There was mild transaminase elevation.  An echo Doppler study from March 10, 2022 showed normal LV function with EF 60 to 65%.  There was grade 2 diastolic dysfunction with mild LVH and moderate LA dilation.  There was mild MR.  There was no evidence for pericardial effusion.  I last saw him on November 25, 2022. Mr. Kido felt well.  He was recently in the [employer].  He is back exercising.  He has been lifting weights and walking over the past 3 months typically at for 45 minutes to an hour.  He denies any pleuritic chest pain.  There are no fever chills or night sweats.  There is no shortness of breath.  He he has not been successful with weight loss.  He has continued to be on amlodipine 5 mg, metoprolol succinate 50 mg, and still has been taking colchicine 0.6 mg twice a day.  During that evaluation, I felt he was stable suggested weaning of colchicine to 0.6 mg daily for 2 weeks then reduce to every other day for 2 weeks and then discontinue.  As I last saw him, he has done fairly well.  However, he was evaluated in the emergency room May 14, 2023 with complaints of left-sided chest and back discomfort.  His symptoms started approximately 1 week previously.  They were not exertionally precipitated.  At times they were sharp. He had an extensive evaluation.  Laboratory stable with the exception of elevation of ALT at 68.  D-dimer was normal at less than 0.27; troponins were negative.  COVID was negative.  CBC was stable.  Scho[place], he underwent CT imaging of his abdomen and pelvis which was  normal.  As only, he feels well.  He denies any pleuritic chest pain.  He admits to recent weight gain.  He is unaware of palpitations, presyncope or syncope.  He presents for reevaluation.  Past Medical History:  Diagnosis Date   Arthritis of both knees    Back pain    Chest pain    Constipation    Depression    Food allergy    coconut   Hypertension    Joint pain    Low back pain    herniated disc   Lumbar herniated disc    Obesity    OSA (obstructive sleep apnea)    has not yet received CPAP   Other fatigue    Pericarditis    in his 61s   Prediabetes    Shortness of breath    Shortness of breath on exertion     Past Surgical History:  Procedure Laterality Date   KNEE SURGERY Right 2016    Current Medications: Outpatient Medications Prior to Visit  Medication Sig Dispense Refill   allopurinol (ZYLOPRIM) 100 MG tablet Take 100 mg by mouth daily.     amLODipine (NORVASC) 5 MG tablet TAKE 1 TABLET (5 MG TOTAL) BY MOUTH DAILY. 30 tablet 5   metoprolol succinate (TOPROL-XL) 50 MG 24 hr tablet TAKE 1 TABLET BY MOUTH EVERY DAY WITH  OR IMMEDIATELY FOLLOWING A MEAL 30 tablet 11   naproxen (NAPROSYN) 500 MG tablet Take 1 tablet (500 mg total) by mouth 2 (two) times daily. 60 tablet 2   pantoprazole (PROTONIX) 40 MG tablet Take 1 tablet (40 mg total) by mouth daily. 30 tablet 10   QUEtiapine (SEROQUEL) 25 MG tablet Take 25 mg by mouth at bedtime.     colchicine 0.6 MG tablet TAKE 1 TABLET BY MOUTH 2 TIMES DAILY. 90 tablet 3   No facility-administered medications prior to visit.     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, broadcasting/film/video, Instructor, Engineer, agricultural)   Tobacco Use   Smoking status: Never   Smokeless tobacco: Never  Vaping Use   Vaping status: Never Used  Substance and Sexual Activity   Alcohol use: No   Drug use: No   Sexual activity: Not Currently  Other Topics Concern   Not on file  Social History Narrative   Not on file   Social Determinants of Health   Financial Resource Strain: Not on file  Food Insecurity: Not on file  Transportation Needs: Not on file  Physical Activity: Not on file  Stress: Not on file  Social Connections: Unknown (02/11/2022)   Received from [facility]   Social Network    Social Network: Not on file    Socially he is single.  He was born in [place] with early high school.  He has a BS degree.  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  He has a child.  Family History:  The patient's family history includes Alcoholism in his father and another family member; Depression in his mother and another family member; Diabetes in his brother, maternal grandmother, mother, and another family member; Heart disease in his father; High Cholesterol in his maternal grandmother; Hyperlipidemia in his father and mother; Hypertension in his father, mother, and another family member; Obesity in his mother and another family member; Sleep apnea in his mother and another family member; Stroke in his maternal grandfather and maternal grandmother.     ROS General: Negative; No fevers, chills, or night sweats;  HEENT: Negative; No changes in vision or hearing, sinus congestion, difficulty swallowing Pulmonary: Significant improvement in prior pleuritic chest pain Cardiovascular: See HPI GI: Negative; No nausea, vomiting, diarrhea, or abdominal pain GU: Negative; No dysuria, hematuria, or difficulty voiding Musculoskeletal: Negative; no myalgias, joint pain, or  weakness Hematologic/Oncology: Negative; no easy bruising, bleeding Endocrine: Negative; no heat/cold intolerance; no diabetes Neuro: Negative; no changes in balance, headaches Skin: Negative; No rashes or skin lesions Psychiatric: Negative; No behavioral problems, depression Sleep: OSA, noncompliant with CPAP and as result is CPAP machine was picked up by his DME company;  history of snoring, daytime sleepiness, hypersomnolence Other comprehensive 14 point system review is negative.   PHYSICAL EXAM:   VS:  BP 126/80 (BP Location: Right Arm, Patient Position: Sitting, Cuff Size: Large)   Pulse (!) 59   Ht 6\' 3"  (  1.905 m)   Wt (!) 337 lb 12.8 oz (153.2 kg) Comment: 337.8lb  SpO2 95%   BMI 42.22 kg/m     Repeat blood pressure by me was 16/84  Wt Readings from Last 3 Encounters:  06/22/23 (!) 337 lb 12.8 oz (153.2 kg)  05/14/23 (!) 315 lb (142.9 kg)  11/25/22 (!) 335 lb 6.4 oz (152.1 kg)   General: Alert, oriented, no distress.  Mesomorphic  body habitus Skin: normal turgor, no rashes, warm and dry HEENT: Normocephalic, atraumatic. Pupils equal round and reactive to light; sclera anicteric; extraocular muscles intact;  Nose without nasal septal hypertrophy Mouth/Parynx benign; Mallinpatti scale Neck: No JVD, no carotid bruits; normal carotid upstroke Lungs: clear to ausculatation and percussion; no wheezing or rales Chest wall: without tenderness to palpitation Heart: PMI not displaced, RRR, s1 s2 normal, 1/6 systolic murmur, no diastolic murmur, no rubs, gallops, thrills, or heaves Abdomen: soft, nontender; no hepatosplenomehaly, BS+; abdominal aorta nontender and not dilated by palpation. Back: no CVA tenderness Pulses 2+ Musculoskeletal: full range of motion, normal strength, no joint deformities Extremities: no clubbing cyanosis or edema, Homan's sign negative  Neurologic: grossly nonfocal; Cranial nerves grossly wnl Psychologic: Normal mood and affect     Studies/Labs  Reviewed:   EKG Interpretation Date/Time:  Thursday June 22 2023 10:23:46 EDT Ventricular Rate:  59 PR Interval:  210 QRS Duration:  96 QT Interval:  416 QTC Calculation: 411 R Axis:   31  Text Interpretation: Sinus bradycardia with 1st degree A-V block When compared with ECG of 15-May-2023 00:52, PREVIOUS ECG IS PRESENT Confirmed by Nicki Guadalajara ([id number]) on 06/22/2023 10:51:57 AM    November 25, 2022  ECG (independently read by me): NSR at 62, normal PR segment;  no STT changes  Feb 16, 2022 ECG (independently read by me): Sinus bradycardia 56 bpm.  Normal PR segment.  No significant ST changes.  October 07, 2021 ECG (independently read by me): NSR at 66; PR 182 msec; QTc 429 msec  August 25, 2021 ECG (independently read by me):  Sinus bradycardia at 53, normal PR segment   May 16,2022 ECG (independently read by me): Normal sinus rhythm at 72 bpm.  Previously noted T wave abnormality in lead III and aVF.  January 28, 2021 ECG (independently read by me): NSR at 63; LVH Q III T wave abnormality III, aVF  December 04, 2020 ECG (independently read by me): NSR at 71; mild early repolarization changes; QTc 430  I reviewed his recent ECGs from his emergency room and hospitalizations.  Early repolarization changes had evolved since his October 28, 2020 ECG.  Recent Labs:    Latest Ref Rng & Units 05/14/2023   11:36 PM 02/16/2022   12:45 PM 12/07/2021    8:50 AM  BMP  Glucose 70 - 99 mg/dL 89  85  90   BUN 6 - 20 mg/dL 14  11  19    Creatinine 0.61 - 1.24 mg/dL 9.62  9.52  8.41   BUN/Creat Ratio 9 - 20  11  19    Sodium 135 - 145 mmol/L 141  143  143   Potassium 3.5 - 5.1 mmol/L 3.8  4.3  4.4   Chloride 98 - 111 mmol/L 106  106  102   CO2 22 - 32 mmol/L 27  24  23    Calcium 8.9 - 10.3 mg/dL 8.9  9.7  9.3         Latest Ref Rng & Units 05/14/2023   11:36 PM  02/16/2022   12:45 PM 12/07/2021    8:50 AM  Hepatic Function  Total Protein 6.5 - 8.1 g/dL 6.9  7.3  7.4   Albumin 3.5 - 5.0  g/dL 3.8  4.5  4.5   AST 15 - 41 U/L 36  45  33   ALT 0 - 44 U/L 68  71  53   Alk Phosphatase 38 - 126 U/L 74  77  87   Total Bilirubin 0.3 - 1.2 mg/dL 0.6  0.7  0.7        Latest Ref Rng & Units 05/14/2023   11:36 PM 02/16/2022   12:45 PM 12/07/2021    8:50 AM  CBC  WBC 4.0 - 10.5 K/uL 8.4  5.7  8.1   Hemoglobin 13.0 - 17.0 g/dL 95.6  21.3  08.6   Hematocrit 39.0 - 52.0 % 40.5  43.0  44.3   Platelets 150 - 400 K/uL 226  212  202    Lab Results  Component Value Date   MCV 89.4 05/14/2023   MCV 89 02/16/2022   MCV 92 12/07/2021   Lab Results  Component Value Date   TSH 1.700 12/07/2021   Lab Results  Component Value Date   HGBA1C 6.0 (H) 12/07/2021     BNP    Component Value Date/Time   BNP 63.4 08/24/2021 2030    ProBNP No results found for: "PROBNP"   Lipid Panel     Component Value Date/Time   CHOL 152 12/07/2021 0850   TRIG 82 12/07/2021 0850   HDL 62 12/07/2021 0850   LDLCALC 74 12/07/2021 0850   LABVLDL 16 12/07/2021 0850     RADIOLOGY: CT ABDOMEN PELVIS W CONTRAST  Result Date: 05/27/2023 CLINICAL DATA:  Left-sided abdominal pain for 2 weeks EXAM: CT ABDOMEN AND PELVIS WITH CONTRAST TECHNIQUE: Multidetector CT imaging of the abdomen and pelvis was performed using the standard protocol following bolus administration of intravenous contrast. RADIATION DOSE REDUCTION: This exam was performed according to the departmental dose-optimization program which includes automated exposure control, adjustment of the mA and/or kV according to patient size and/or use of iterative reconstruction technique. CONTRAST:  Oral contrast and ISOVUE-300 IOPAMIDOL (ISOVUE-300) INJECTION 61% COMPARISON:  12/12/2013. FINDINGS: Lower chest: Lung bases are clear. No pleural or pericardial effusion. Hepatobiliary: No focal liver abnormality is seen. No gallstones, gallbladder wall thickening, or biliary dilatation. Pancreas: Unremarkable. No pancreatic ductal dilatation or  surrounding inflammatory changes. Spleen: Normal in size without focal abnormality. Adrenals/Urinary Tract: Adrenal glands are unremarkable. Kidneys are normal, without renal calculi, focal lesion, or hydronephrosis. Bladder is unremarkable. Stomach/Bowel: Stomach is within normal limits. Appendix appears normal. No evidence of bowel wall thickening, distention, or inflammatory changes. Vascular/Lymphatic: No significant vascular findings are present. No enlarged abdominal or pelvic lymph nodes. Reproductive: Prostate is unremarkable. Other: No abdominal wall hernia or abnormality. No abdominopelvic ascites. Musculoskeletal: Lumbar spine degenerative changes with vacuum disc changes L4-5 and L3-4. IMPRESSION: No acute abdominal or pelvic pathology. Electronically Signed   By: Layla Maw M.D.   On: 05/27/2023 10:33     Additional studies/ records that were reviewed today include:  I reviewed his prior office notes, January 2022 hospital records, ECGs, and subsequent evaluation with Bettina Gavia, PA  ER hospital records from August 2024 were reviewed   ASSESSMENT:    1. Atypical chest pain   2. LFT elevation   3. Pericarditis, unspecified chronicity, unspecified type: resolved   4. COVID-19 virus infection: January 2022  5. Elevated LFTs   6. NAFLD (nonalcoholic fatty liver disease)   7. Obesity, Class III, BMI 40-49.9 (morbid obesity) (HCC):: Mesomorphic body habitus     PLAN:  Mr. ROBERTSON MODGLIN is a 42 year old gentleman who has a history of hypertension, obstructive sleep apnea, Covid 19 infection on October 02, 2020 with subsequent sequelae of fever, chills, headache, myalgias, and pneumonia.  Spinal tap was negative.  He was felt to have possible pericarditis and was treated with colchicine and ibuprofen and had taken Relafen without much benefit compared to previous ibuprofen.  When I saw him on December 04, 2020 I recommended he resume ibuprofen with his recurrent symptomatology.  A PA and  lateral chest x-ray was without acute disease.  I also scheduled him for cardiac MRI which essentially was normal.  Subsequent laboratory also showed significantly improved erythrocyte sedimentation rate and C-reactive protein was normal.  He has developed recurrent episodes of knifelike chest discomfort leading to reevaluation in May.  In October 2022 he again experienced recurrent symptomatology and saw Dr. Bufford Buttner and subsequently Azalee Course, PA.  C-reactive protein had elevated to 19 and he was started on indomethacin.  He was started back on colchicine when seen by Azalee Course, PA on August 03, 2021.  He continued to experience recurrent symptomatology.  When I saw him in late November 2022, he had stage II hypertension and I started him on amlodipine 5 mg in addition to spironolactone.  A subsequent 2D echo Doppler study showed normal systolic function.  Pericardium was normal.  There was mild atrial dilatation.  He has been on ibuprofen most recently at 600 mg every 8 hours in addition to colchicine 0.6 mg twice daily as well as amlodipine 5 mg, metoprolol succinate 50 mg daily.  With this therapy, his inflammation has essentially resolved.  His CRP is 2 and sedimentation rate 7.  At his office visit in January 2023 his blood pressure was stable and I recommended reduction of ibuprofen to 400 mg every 8 hours for 2 days and then reduce this to 200 mg every 8 hours with subsequent as needed.  At his May 2023 evaluation, I recommended laboratory as well as 2D echo Doppler follow-up assessment.  Erythrocyte sedimentation rate was upper normal at 17.  C-reactive protein was 2.  There was mild transaminase elevation felt to be due to nonalcoholic fatty liver disease.  His echo Doppler study showed an EF of 60 to 65% with grade 2 diastolic dysfunction and mild LVH.  There was moderate LA dilatation, mild MR, and no evidence for pericardial effusion.  Last seen by me in February 2024 he was doing well.  At that time I  recommended ultimate weaning of colchicine.  Subsequently, he has continued to well.  However he developed an episode of sided chest and back pain leading to ER evaluation on May 14, 2023.  I reviewed his ER records.  Laboratory was all normal although he had minimal ALT elevation at 68.  Troponins were negative.  Subsequent CT imaging of his abdomen and pelvis was entirely normal.  CT did not reveal any pericardial effusion or infiltrate in his lungs.  He has experienced some weight gain with BMI at 42.2.  I discussed the importance of resumption of his typical exercise.  He rarely takes colchicine but may take 1 dose on a as needed basis depending upon symptomatology.  Chemistry profile today.  No longer uses CPAP therapy with his sleep apnea consideration for dental appliance.  Blood  pressure today is stable on amlodipine 5 mg in addition to metoprolol succinate 50 mg daily.  He takes pantoprazole for GERD.  I will see him in April/May 2025 for follow-up evaluation.  I discussed with him my plans for future retirement.  He had seen Dr. Bufford Buttner in the past and have recommended he follow-up with Dr. Bufford Buttner subsequent to my retirement.   Medication Adjustments/Labs and Tests Ordered: Current medicines are reviewed at length with the patient today.  Concerns regarding medicines are outlined above.  Medication changes, Labs and Tests ordered today are listed in the Patient Instructions below. Patient Instructions  Medication Instructions:  No medication changes *If you need a refill on your cardiac medications before your next appointment, please call your pharmacy*   Lab Work: CMET If you have labs (blood work) drawn today and your tests are completely normal, you will receive your results only by: MyChart Message (if you have MyChart) OR A paper copy in the mail If you have any lab test that is abnormal or we need to change your treatment, we will call you to review the  results.   Testing/Procedures: none   Follow-Up: At [facility], you and your health needs are our priority.  As part of our continuing mission to provide you with exceptional heart care, we have created designated Provider Care Teams.  These Care Teams include your primary Cardiologist (physician) and Advanced Practice Providers (APPs -  Physician Assistants and Nurse Practitioners) who all work together to provide you with the care you need, when you need it.  We recommend signing up for the patient portal called "MyChart".  Sign up information is provided on this After Visit Summary.  MyChart is used to connect with patients for Virtual Visits (Telemedicine).  Patients are able to view lab/test results, encounter notes, upcoming appointments, etc.  Non-urgent messages can be sent to your provider as well.   To learn more about what you can do with MyChart, go to [email or web address].    Your next appointment:   6 month(s)  Provider:   Nicki Guadalajara, MD       Signed, Nicki Guadalajara, MD  06/22/2023 11:18 AM    [facility] [address], [place], [place]  [address] Phone: [phone]

## 2023-06-23 LAB — COMPREHENSIVE METABOLIC PANEL
ALT: 73 IU/L — ABNORMAL HIGH (ref 0–44)
AST: 48 IU/L — ABNORMAL HIGH (ref 0–40)
Albumin: 4 g/dL — ABNORMAL LOW (ref 4.1–5.1)
Alkaline Phosphatase: 86 IU/L (ref 44–121)
BUN/Creatinine Ratio: 9 (ref 9–20)
BUN: 9 mg/dL (ref 6–24)
Bilirubin Total: 0.5 mg/dL (ref 0.0–1.2)
CO2: 25 mmol/L (ref 20–29)
Calcium: 9.2 mg/dL (ref 8.7–10.2)
Chloride: 106 mmol/L (ref 96–106)
Creatinine, Ser: 1.01 mg/dL (ref 0.76–1.27)
Globulin, Total: 3.3 g/dL (ref 1.5–4.5)
Glucose: 98 mg/dL (ref 70–99)
Potassium: 4.2 mmol/L (ref 3.5–5.2)
Sodium: 142 mmol/L (ref 134–144)
Total Protein: 7.3 g/dL (ref 6.0–8.5)
eGFR: 95 mL/min/{1.73_m2} (ref >59)

## 2023-06-29 NOTE — Addendum Note (Signed)
Addended by: Shon Hale on: 06/29/2023 04:20 PM   Modules accepted: Orders

## 2023-08-08 ENCOUNTER — Other Ambulatory Visit: Payer: Self-pay

## 2023-08-08 DIAGNOSIS — R7401 Elevation of levels of liver transaminase levels: Secondary | ICD-10-CM

## 2023-08-09 ENCOUNTER — Telehealth: Payer: Self-pay | Admitting: Cardiovascular Disease

## 2023-08-09 LAB — COMPREHENSIVE METABOLIC PANEL
ALT: 54 [IU]/L — ABNORMAL HIGH (ref 0–44)
AST: 41 [IU]/L — ABNORMAL HIGH (ref 0–40)
Albumin: 4.2 g/dL (ref 4.1–5.1)
Alkaline Phosphatase: 86 [IU]/L (ref 44–121)
BUN/Creatinine Ratio: 15 (ref 9–20)
BUN: 16 mg/dL (ref 6–24)
Bilirubin Total: 0.6 mg/dL (ref 0.0–1.2)
CO2: 26 mmol/L (ref 20–29)
Calcium: 9.5 mg/dL (ref 8.7–10.2)
Chloride: 105 mmol/L (ref 96–106)
Creatinine, Ser: 1.07 mg/dL (ref 0.76–1.27)
Globulin, Total: 3.1 g/dL (ref 1.5–4.5)
Glucose: 96 mg/dL (ref 70–99)
Potassium: 4.5 mmol/L (ref 3.5–5.2)
Sodium: 142 mmol/L (ref 134–144)
Total Protein: 7.3 g/dL (ref 6.0–8.5)
eGFR: 88 mL/min/{1.73_m2} (ref >59)

## 2023-08-09 NOTE — Telephone Encounter (Signed)
Pt called concerned after reviewing lab results via mychart. Informed him Dr. Tresa Endo has not reviewed yet but once reviewed nurse will call with recommendations

## 2023-08-09 NOTE — Telephone Encounter (Signed)
Patient is calling for lab results from 11/5. Please advise.

## 2023-08-28 NOTE — Telephone Encounter (Signed)
Pt would like a c/b regarding results. Please advise

## 2023-08-28 NOTE — Telephone Encounter (Signed)
Spoke to patient recent lab results given.Stated he does not drink alcohol.He wanted to ask Dr.Kelly what would be causing elevated liver functions.Advised Dr.Kelly is not in office.I will send message to him for advice.

## 2023-09-15 NOTE — Telephone Encounter (Signed)
Patient identification verified by 2 forms. Marilynn Rail, RN    Called and spoke to patient  Patient states:   -is aware of previously elevated LFT results   -would like to know why provider states he used Alcohol when he does not   -wants to know why is liver test is elevated   -Does not use a lot of Tylenol  Informed patient:   -Per provider note LFT is improving  -Provider note states he questions if the is alcohol use, not an accusatory statement of alcohol use   -Provider was inquiring if there is alcohol use as that can cause elevated LFT  -since there is no alcohol use per his statement, message will be sent to provider  regarding concerns of elevated LFT without alcohol use  Patient verbalized understanding, no questions at this time

## 2023-09-15 NOTE — Telephone Encounter (Signed)
Patient is following up with concerns due to not hearing back from anyone. Please advise ASAP.

## 2023-10-02 NOTE — Telephone Encounter (Signed)
LFTs can be elevated without alcohol use, particularly with fatty liver.  Consider getting a liver CT for further evaluation

## 2023-10-02 NOTE — Telephone Encounter (Signed)
Patient identification verified by 2 forms. Marilynn Rail, RN    Called and spoke to patient  Relayed provider message below  Advised patient to follow up at 3/10 appointment  Patient has no questions or concerns at this time

## 2023-10-06 ENCOUNTER — Ambulatory Visit: Payer: 59 | Admitting: Physician Assistant

## 2023-10-11 ENCOUNTER — Encounter: Payer: Self-pay | Admitting: Physician Assistant

## 2023-10-11 ENCOUNTER — Ambulatory Visit: Payer: 59 | Admitting: Physician Assistant

## 2023-10-11 DIAGNOSIS — M5442 Lumbago with sciatica, left side: Secondary | ICD-10-CM

## 2023-10-11 DIAGNOSIS — G8929 Other chronic pain: Secondary | ICD-10-CM

## 2023-10-11 NOTE — Progress Notes (Signed)
 Office Visit Note   Patient: Nathaniel Jones           Date of Birth: 1981-07-16           MRN: [id number] Visit Date: 10/11/2023              Requested by: No referring provider defined for this encounter. PCP: Merilee, L.Addie, MD (Inactive)   Assessment & Plan: Visit Diagnoses: Osteoarthritis bilateral knees                             Low back pain  Plan: Nathaniel Jones is a patient of Dr. Vernetta and Marvis.  He has varus arthritis bilateral knees.  Last was injected last summer has had no new injury requesting an injection.  He also has a history of low back pain.  His last injection with Dr. Eldonna was was almost 2 years ago.  It was a transforaminal injection on the left of L5.  He has had return of symptoms in his low Badik positive straight leg raise on the left.  He said it is exactly has his pain was before he like another injection if that is possible we will place a referral  Follow-Up Instructions: As needed  Orders:  No orders of the defined types were placed in this encounter.  No orders of the defined types were placed in this encounter.     Procedures: No procedures performed   Clinical Data: No additional findings.   Subjective: No chief complaint on file.   HPI pleasant 43 year old gentleman with osteoarthritis bilateral knees he does well with injections and was last injected last summer.  He also has a history of facet arthropathy in his low back.  He has had injections with Dr. Eldonna in the past again has helped him quite a bit has had no new injury denies any loss of bowel or bladder control or any weakness  Review of Systems  All other systems reviewed and are negative.    Objective: Vital Signs: There were no vitals taken for this visit.  Physical Exam Constitutional:      Appearance: Normal appearance.  Pulmonary:     Effort: Pulmonary effort is normal.  Skin:    General: Skin is warm and dry.  Neurological:     General: No focal deficit  present.     Mental Status: He is alert and oriented to person, place, and time.  Psychiatric:        Mood and Affect: Mood normal.        Behavior: Behavior normal.     Ortho Exam Bilateral knees may be small effusion in the right but no effusion otherwise no erythema compartments are soft and compressible he is neurovascularly intact he has grinding and more pain medially he does have some varus malalignment Low back he does not have any step-offs tenderness over the low back facet joints.  He has neurologically he is intact he does have a positive straight leg raise on the left which reproduces the pain in his low back Specialty Comments:  No specialty comments available.  Imaging: No results found.   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   Suspected COVID-19 virus infection 12/24/2019   Unilateral primary osteoarthritis, left knee 05/17/2018   Unilateral primary osteoarthritis, right knee 05/17/2018   Chronic pain of both knees 05/17/2018   Biceps rupture, proximal, right, initial encounter 05/22/2017   Chronic bilateral low back pain without sciatica 03/08/2017   Lateral epicondylitis, right elbow 03/08/2017   It band syndrome, left 03/08/2017   Past Medical History:  Diagnosis Date   Arthritis of both knees    Back pain    Chest pain    Constipation    Depression    Food allergy    coconut   Hypertension    Joint pain    Low back pain    herniated disc   Lumbar  herniated disc    Obesity    OSA (obstructive sleep apnea)    has not yet received CPAP   Other fatigue    Pericarditis    in his 47s   Prediabetes    Shortness of breath    Shortness of breath on exertion     Family History  Problem Relation Age of Onset   Obesity Mother    Sleep apnea Mother    Depression Mother    Hyperlipidemia Mother    Hypertension Mother    Diabetes Mother    Alcoholism Father    Hyperlipidemia Father    Hypertension Father    Heart disease Father        Unclear details   Diabetes Brother    Stroke Maternal Grandmother    Diabetes Maternal Grandmother    High Cholesterol Maternal Grandmother    Stroke Maternal Grandfather    Hypertension Other    Diabetes Other    Depression Other    Sleep apnea Other    Alcoholism Other    Obesity Other    Autoimmune disease Neg Hx     Past Surgical History:  Procedure Laterality Date   KNEE SURGERY Right 2016   Social History   Occupational History   Occupation: Nurse, Adult    Employer: [employer]    Comment: [employer]ner, Broadcasting/film/video, Instructor, Engineer, Agricultural)  Tobacco Use   Smoking status: Never   Smokeless tobacco: Never  Vaping Use   Vaping status: Never Used  Substance and Sexual Activity   Alcohol use: No   Drug use: No   Sexual activity: Not Currently

## 2023-10-30 ENCOUNTER — Encounter: Payer: 59 | Admitting: Physical Medicine and Rehabilitation

## 2023-10-31 ENCOUNTER — Encounter: Payer: 59 | Admitting: Physical Medicine and Rehabilitation

## 2023-11-13 ENCOUNTER — Encounter: Payer: 59 | Admitting: Physical Medicine and Rehabilitation

## 2023-11-21 ENCOUNTER — Other Ambulatory Visit: Payer: Self-pay

## 2023-11-21 ENCOUNTER — Ambulatory Visit: Payer: 59 | Admitting: Physical Medicine and Rehabilitation

## 2023-11-21 VITALS — BP 135/85 | HR 77

## 2023-11-21 DIAGNOSIS — M5416 Radiculopathy, lumbar region: Secondary | ICD-10-CM | POA: Diagnosis not present

## 2023-11-21 MED ORDER — METHYLPREDNISOLONE ACETATE 40 MG/ML IJ SUSP
40.0000 mg | Freq: Once | INTRAMUSCULAR | Status: AC
Start: 2023-11-21 — End: 2023-11-21
  Administered 2023-11-21: 40 mg

## 2023-11-21 NOTE — Patient Instructions (Addendum)
Dr. Tressie Ellis McGill  ?The Big Three? that will safely increase your endurance and protect your back: modified curl-up, side bridge, and bird dog. ? ?1. Modified Curl-Up ?Lie your back with one knee bent and one knee straight, this puts your pelvis in a neutral position and the muscles of your core in an optimal alignment of pull to avoid strain on the low back. Place your hands under the arch of your low back and ensure that this arch is maintained throughout the curl-up. Start by bracing your abdomen; this is different from flexing your abs, bear down through your belly. Now make sure you can take a breath in and a breath out while maintaining this brace. If you cannot, stop there and practice doing just that until you?ve got it mastered! Now, pretend that your spine in your neck and your upper back are cemented together and do not move independently. Pick a spot on the ceiling and focus your gaze there, lift your shoulder blades about 30? off the floor and slowly return to the start position. Take note of your neck, and ensure that your chin isn?t poking forward when you do a curl up. If you?re struggling with that, focus on making a double chin. Perform 3 sets of 10-12. ? ?2. Side Bridge ?Lie on your side and prop yourself up on your elbow. Ensure that your elbow is directly under your shoulder to avoid any unnecessary strain through your shoulder joint. With your legs straight, place your top foot on the ground in front of your bottom foot. Place your top hand on your bottom shoulder. While maintaining the natural curve of your spine, that is to say, be sure that your upper body isn?t twisted or leaning forward, brace your abdomen, squeeze through your gluteals (clench your bum), and lift your hips up off the ground. Don?t forget to breathe! Hold for 8-10 seconds, repeat 3 times. As the exercise becomes easier, increase the number of repetitions as opposed to the length of time. There are a number of ways to  modify this exercise in order to increase or decrease the difficulty such as the example below on the right. If it?s not challenging enough, try putting that top hand on your top hip, or straight up in the air, but again, be sure your body stays straight! ? ?3. Bird Dog ?Start on your hands and knees with your hands shoulder width apart directly under your shoulders, and knees hip width apart directly under your hips. Maintain a neutral spine. Brace through your abdomen and squeeze your gluteals. Ensure you can maintain this while you take a breath in and out. Lift your right arm in front until it?s level with your shoulder, squeezing the muscles between your shoulder blades as you do so. At the same time, extend your left leg straight back until it is level with your hips, squeezing your gluteals, and keeping your hips square to the floor. Return to the starting position in a slow and controlled manner, and perform the same action with the left arm and right leg. That is one repetition. Perform 3 sets of 8-10 repetitions. As this exercise becomes easy, focus on co-contracting the muscles of your forearm and arms while you extend, the same goes for the muscles of your legs. For an additional challenge, instead of putting your hand and knee back down on the ground between reps, try just sweeping the floor and performing the next rep right away, or draw a square with your  arm and leg and then sweep the floor. ? ?*Avoid exercises that forward flex the spine or extend the spine. ? ? ? ?[facility] Discharge Instructions ? ?*At any time if you have questions or concerns they can be answered by calling [phone] ? ?All Patients: ?You may experience an increase in your symptoms for the first 2 days (it can take 2 days to 2 weeks for the steroid/cortisone to have its maximal effect). ?You may use ice to the site for the first 24 hours; 20 minutes on and 20 minutes off and may use heat after that time. ?You  may resume and continue your current pain medications. If you need a refill please contact the prescribing physician. ?You may resume your medications if any were stopped for the procedure. ?You may shower but no swimming, tub bath or Jacuzzi for 24 hours. ?Please remove bandage after 4 hours. ?You may resume light activities as tolerated. ?If you had Spine Injection, you should not drive for the next 3 hours due to anesthetics used in the procedure. Please have someone drive for you. ? ?*If you have had sedation, Valium, Xanax, or lorazepam: Do not drive or use public transportation for 24 hours, do not operating hazardous machinery or make important personal/business decisions for 24 hours. ? ?POSSIBLE STEROID SIDE EFFECTS: If experienced these should only last for a short period. ?Change in menstrual flow  Edema in (swelling)  Increased appetite ?Skin flushing (redness)  Skin rash/acne  Thrush (oral) ?Vaginitis    Increased sweating  Depression ?Increased blood glucose levels Cramping and leg/calf  Euphoria (feeling happy) ? ?POSSIBLE PROCEDURE SIDE EFFECTS: Please call our office if concerned. ?Increased pain Increased numbness/tingling  Headache ?Nausea/vomiting Hematoma (bruising/bleeding) Edema (swelling at the site) ?Weakness  Infection (red/drainage at site) Fever greater than 100.5?F ? ?*In the event of a headache after epidural steroid injection: Drink plenty of fluids, especially water and try to lay flat when possible. If the headache does not get better after a few days or as always if concerned please call the office. ? ?

## 2023-11-21 NOTE — Progress Notes (Signed)
Pain Score----4 No thinners No Allergies to contrast dye

## 2023-11-21 NOTE — Progress Notes (Signed)
 Nathaniel Jones - 43 y.o. male MRN [id number]  Date of birth: [birth year]  Office Visit Note: Visit Date: 11/21/2023 PCP: [name], [name], MD (Inactive) Referred by: Persons, [place], [place]  Subjective: Chief Complaint  Patient presents with   Lower Back - Pain   HPI:  Nathaniel Jones is a 43 y.o. male who comes in today at the request of [place] Persons, PA-C for planned Left L5-S1 Lumbar Transforaminal epidural steroid injection with fluoroscopic guidance.  The patient has failed conservative care including home exercise, medications, time and activity modification.  This injection will be diagnostic and hopefully therapeutic.  Please see requesting physician notes for further details and justification.  His last injection was performed many years ago and did well and he is having symptoms that he feels like are very similar.  Please review Marianne's notes.  His MRI however is quite old at this point from 2017.  Depending on relief would be pretty quick to update that image.  No red flag complaints.   ROS Otherwise per HPI.  Assessment & Plan: Visit Diagnoses:    ICD-10-CM   1. Lumbar radiculopathy  M54.16 XR C-ARM NO REPORT    Epidural Steroid injection    methylPREDNISolone acetate (DEPO-MEDROL) injection 40 mg      Plan: No additional findings.   Meds & Orders:  Meds ordered this encounter  Medications   methylPREDNISolone acetate (DEPO-MEDROL) injection 40 mg    Orders Placed This Encounter  Procedures   XR C-ARM NO REPORT   Epidural Steroid injection    Follow-up: Return if symptoms worsen or fail to improve, for Consider new Lspine MRI.   Procedures: No procedures performed  Lumbosacral Transforaminal Epidural Steroid Injection - Sub-Pedicular Approach with Fluoroscopic Guidance  Patient: Nathaniel Jones      Date of Birth: May 09, 1981 MRN: [id number] PCP: [name], [name], MD (Inactive)      Visit Date: 11/21/2023   Universal Protocol:    Date/Time:  11/21/2023  Consent Given By: the patient  Position: PRONE  Additional Comments: Vital signs were monitored before and after the procedure. Patient was prepped and draped in the usual sterile fashion. The correct patient, procedure, and site was verified.   Injection Procedure Details:   Procedure diagnoses: Lumbar radiculopathy [M54.16]    Meds Administered:  Meds ordered this encounter  Medications   methylPREDNISolone acetate (DEPO-MEDROL) injection 40 mg    Laterality: Left  Location/Site: L5  Needle:6.0 in., 22 ga.  Short bevel or Quincke spinal needle  Needle Placement: Transforaminal  Findings:    -Comments: Excellent flow of contrast along the nerve, nerve root and into the epidural space.  Procedure Details: After squaring off the end-plates to get a true AP view, the C-arm was positioned so that an oblique view of the foramen as noted above was visualized. The target area is just inferior to the "nose of the scotty dog" or sub pedicular. The soft tissues overlying this structure were infiltrated with 2-3 ml. of 1% Lidocaine without Epinephrine.  The spinal needle was inserted toward the target using a "trajectory" view along the fluoroscope beam.  Under AP and lateral visualization, the needle was advanced so it did not puncture dura and was located close the 6 O'Clock position of the pedical in AP tracterory. Biplanar projections were used to confirm position. Aspiration was confirmed to be negative for CSF and/or blood. A 1-2 ml. volume of Isovue-250 was injected and flow of contrast was noted at each level.  Radiographs were obtained for documentation purposes.   After attaining the desired flow of contrast documented above, a 0.5 to 1.0 ml test dose of 0.25% Marcaine was injected into each respective transforaminal space.  The patient was observed for 90 seconds post injection.  After no sensory deficits were reported, and normal lower extremity motor function was  noted,   the above injectate was administered so that equal amounts of the injectate were placed at each foramen (level) into the transforaminal epidural space.   Additional Comments:  The patient tolerated the procedure well Dressing: 2 x 2 sterile gauze and Band-Aid    Post-procedure details: Patient was observed during the procedure. Post-procedure instructions were reviewed.  Patient left the clinic in stable condition.    Clinical History: Narrative & Impression CLINICAL DATA:  Low back pain with left leg numbness in bilateral knee pain.   EXAM: MRI LUMBAR SPINE WITHOUT CONTRAST   TECHNIQUE: Multiplanar, multisequence MR imaging of the lumbar spine was performed. No intravenous contrast was administered.   COMPARISON:  CT scan of the abdomen and pelvis dated 12/12/2013   FINDINGS: Segmentation:  Vestigial ribs at L1.  Otherwise, normal.   Alignment:  Normal.   Vertebrae:  Normal.   Conus medullaris: Extends to the L1 level and appears normal.   Paraspinal and other soft tissues: Normal.   Disc levels:   T11-12 through L2-3:  Normal.   L3-4: Slight disc desiccation. Minimal disc bulges into the neural foramina without neural impingement. Slight hypertrophy of the right facet joint.   L4-5: Disc desiccation. Small broad-based soft disc protrusion extending into both neural foramina and into the left lateral recess compressing the left L5 nerve best seen on images 26 of series 6 and series 7.   L5-S1: Normal disc. Minimal degenerative changes of the facet joints.   IMPRESSION: Soft disc protrusion at L4-5 asymmetric to the left compressing the left L5 nerve in the lateral recess.     Electronically Signed   By: Francene Boyers M.D.   On: 03/25/2016 10:51     Objective:  VS:  HT:    WT:   BMI:     BP:135/85  HR:77bpm  TEMP: ( )  RESP:  Physical Exam Vitals and nursing note reviewed.  Constitutional:      General: He is not in acute  distress.    Appearance: Normal appearance. He is well-developed. He is obese. He is not ill-appearing.  HENT:     Head: Normocephalic and atraumatic.     Right Ear: External ear normal.     Left Ear: External ear normal.     Nose: No congestion.  Eyes:     Extraocular Movements: Extraocular movements intact.     Conjunctiva/sclera: Conjunctivae normal.     Pupils: Pupils are equal, round, and reactive to light.  Cardiovascular:     Rate and Rhythm: Normal rate.     Pulses: Normal pulses.     Heart sounds: Normal heart sounds.  Pulmonary:     Effort: Pulmonary effort is normal. No respiratory distress.  Abdominal:     General: There is no distension.     Palpations: Abdomen is soft.  Musculoskeletal:        General: No tenderness or signs of injury.     Cervical back: Normal range of motion and neck supple. No rigidity.     Right lower leg: No edema.     Left lower leg: No edema.     Comments:  Patient has good distal strength without clonus.  Skin:    General: Skin is warm and dry.     Findings: No erythema or rash.  Neurological:     General: No focal deficit present.     Mental Status: He is alert and oriented to person, place, and time.     Sensory: No sensory deficit.     Motor: No weakness or abnormal muscle tone.     Coordination: Coordination normal.     Gait: Gait normal.  Psychiatric:        Mood and Affect: Mood normal.        Behavior: Behavior normal.      Imaging: No results found.

## 2023-11-28 NOTE — Procedures (Signed)
 Lumbosacral Transforaminal Epidural Steroid Injection - Sub-Pedicular Approach with Fluoroscopic Guidance  Patient: Nathaniel Jones      Date of Birth: January 25, 1981 MRN: [id number] PCP: [name], [name], MD (Inactive)      Visit Date: 11/21/2023   Universal Protocol:    Date/Time: 11/21/2023  Consent Given By: the patient  Position: PRONE  Additional Comments: Vital signs were monitored before and after the procedure. Patient was prepped and draped in the usual sterile fashion. The correct patient, procedure, and site was verified.   Injection Procedure Details:   Procedure diagnoses: Lumbar radiculopathy [M54.16]    Meds Administered:  Meds ordered this encounter  Medications   methylPREDNISolone acetate (DEPO-MEDROL) injection 40 mg    Laterality: Left  Location/Site: L5  Needle:6.0 in., 22 ga.  Short bevel or Quincke spinal needle  Needle Placement: Transforaminal  Findings:    -Comments: Excellent flow of contrast along the nerve, nerve root and into the epidural space.  Procedure Details: After squaring off the end-plates to get a true AP view, the C-arm was positioned so that an oblique view of the foramen as noted above was visualized. The target area is just inferior to the "nose of the scotty dog" or sub pedicular. The soft tissues overlying this structure were infiltrated with 2-3 ml. of 1% Lidocaine without Epinephrine.  The spinal needle was inserted toward the target using a "trajectory" view along the fluoroscope beam.  Under AP and lateral visualization, the needle was advanced so it did not puncture dura and was located close the 6 O'Clock position of the pedical in AP tracterory. Biplanar projections were used to confirm position. Aspiration was confirmed to be negative for CSF and/or blood. A 1-2 ml. volume of Isovue-250 was injected and flow of contrast was noted at each level. Radiographs were obtained for documentation purposes.   After attaining the  desired flow of contrast documented above, a 0.5 to 1.0 ml test dose of 0.25% Marcaine was injected into each respective transforaminal space.  The patient was observed for 90 seconds post injection.  After no sensory deficits were reported, and normal lower extremity motor function was noted,   the above injectate was administered so that equal amounts of the injectate were placed at each foramen (level) into the transforaminal epidural space.   Additional Comments:  The patient tolerated the procedure well Dressing: 2 x 2 sterile gauze and Band-Aid    Post-procedure details: Patient was observed during the procedure. Post-procedure instructions were reviewed.  Patient left the clinic in stable condition.

## 2023-12-08 ENCOUNTER — Encounter (HOSPITAL_BASED_OUTPATIENT_CLINIC_OR_DEPARTMENT_OTHER): Payer: Self-pay

## 2023-12-11 ENCOUNTER — Ambulatory Visit: Payer: 59 | Attending: Cardiovascular Disease | Admitting: Cardiovascular Disease

## 2023-12-11 ENCOUNTER — Encounter: Payer: Self-pay | Admitting: Cardiovascular Disease

## 2023-12-11 VITALS — BP 112/76 | HR 69 | Ht 76.0 in | Wt 342.8 lb

## 2023-12-11 DIAGNOSIS — U071 COVID-19: Secondary | ICD-10-CM | POA: Diagnosis not present

## 2023-12-11 DIAGNOSIS — G4733 Obstructive sleep apnea (adult) (pediatric): Secondary | ICD-10-CM

## 2023-12-11 DIAGNOSIS — K760 Fatty (change of) liver, not elsewhere classified: Secondary | ICD-10-CM

## 2023-12-11 DIAGNOSIS — I319 Disease of pericardium, unspecified: Secondary | ICD-10-CM

## 2023-12-11 DIAGNOSIS — I10 Essential (primary) hypertension: Secondary | ICD-10-CM | POA: Diagnosis not present

## 2023-12-11 DIAGNOSIS — R7989 Other specified abnormal findings of blood chemistry: Secondary | ICD-10-CM

## 2023-12-11 DIAGNOSIS — M545 Low back pain, unspecified: Secondary | ICD-10-CM

## 2023-12-11 DIAGNOSIS — G8929 Other chronic pain: Secondary | ICD-10-CM

## 2023-12-11 DIAGNOSIS — E66813 Obesity, class 3: Secondary | ICD-10-CM

## 2023-12-11 NOTE — Patient Instructions (Signed)
 Medication Instructions:  No medication changes. *If you need a refill on your cardiac medications before your next appointment, please call your pharmacy*   Lab Work: No labs were ordered during today's visit.  If you have labs (blood work) drawn today and your tests are completely normal, you will receive your results only by: MyChart Message (if you have MyChart) OR A paper copy in the mail If you have any lab test that is abnormal or we need to change your treatment, we will call you to review the results.   Testing/Procedures: No procedures were ordered during today's visit.    Follow-Up: At [facility], you and your health needs are our priority.  As part of our continuing mission to provide you with exceptional heart care, we have created designated Provider Care Teams.  These Care Teams include your primary Cardiologist (physician) and Advanced Practice Providers (APPs -  Physician Assistants and Nurse Practitioners) who all work together to provide you with the care you need, when you need it.  We recommend signing up for the patient portal called "MyChart".  Sign up information is provided on this After Visit Summary.  MyChart is used to connect with patients for Virtual Visits (Telemedicine).  Patients are able to view lab/test results, encounter notes, upcoming appointments, etc.  Non-urgent messages can be sent to your provider as well.   To learn more about what you can do with MyChart, go to [email or web address].    Your next appointment:   6-82month(s)  Provider:   Dr. Lennie Odor   Other Instructions        1st Floor: - Lobby - Registration  - Pharmacy  - Lab - Cafe   2nd Floor: - PV Lab - Diagnostic Testing (echo, CT, nuclear med)   3rd Floor: - Vacant   4th Floor: - TCTS (cardiothoracic surgery) - AFib Clinic - Structural Heart Clinic - Vascular Surgery  - Vascular Ultrasound   5th Floor: - [facility] (general and  EP) - Clinical Pharmacy for coumadin, hypertension, lipid, weight-loss medications, and med management appointments      Valet parking services will be available as well.       Thank you for choosing  [facility]!

## 2023-12-11 NOTE — Progress Notes (Signed)
 Cardiology Office Note    Date:  12/11/2023   ID:  Nathaniel Jones, DOB 07-19-1981, MRN [id number]  PCP:  Asencion Gowda.August Saucer, MD (Inactive)  Cardiologist:  Nicki Guadalajara, MD   5 month F/U  History of Present Illness:  Nathaniel Jones is a 43 y.o. male who I had seen in the past for evaluation of chest pain with atypical features as well as sleepiness.  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  Since his playing days when his weight was 255 pounds he has gained weight and has been around 315.  He has a history of hypertension.  In 2018 he had a normal nuclear stress test and an echo Doppler study showed an EF of 55% with moderate LVH with mild left atrial enlargement and mild TR.  He has obstructive sleep apnea and has had issues with significant excessive daytime sleepiness.  Remotely had been evaluated by Dr. Frances Furbish and was reevaluated by me on a home study in October 2021 which showed mild overall sleep apnea with an AHI of 10 but in the past he had been noted to have severe OSA during REM sleep which was unable to be assessed on the home study.  He never had a CPAP titration or AutoPap initiation.  He developed COVID-19 infection on October 02, 2020.  He presented to the emergency room on October 27, 2020 with headaches, chills myalgias and fever.  He was sent home.  He returned on January 27 with neck pain and significant headache and apparently had a spinal tap which was negative.  A chest x-ray was suspicious for possible pneumonia and he was discharged on Augmentin.  He represented the next day with left precordial chest pain and low-grade fever as well as mild cough.  He was admitted to the hospital on October 31, 2020.  CT of the chest was negative for PE and was felt he had pericarditis and possible consolidation of his right middle upper lobe.  He continued to have low fever.  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  Blood cultures were negative.  Procalcitonin was greater than 4, CRP was 40.  Repeat Covid 19 PCR test was negative.  An echo Doppler study showed an EF of 60 to 65% with grade 1 diastolic dysfunction there was no mention of any pericardial effusion.  He was treated with Rocephin and azithromycin and was recommended to take colchicine and continue allopurinol with his history of gout.  The patient was to have a repeat chest x-ray after completion of antibiotic therapy.  He was evaluated by Micah Flesher on November 18, 2020 and admitted to feeling terrible.  He had finished his antibiotic therapy for pneumonia and sinusitis and admitted to being breathless after 1 flight of steps and even taking a shower left knee is significantly fatigued.  He has not yet returned to work.  Due to diagnosis of pericarditis it was recommended he complete 3 months of colchicine therapy she started him on Relafen instead of ibuprofen but he did not notice any significant change and she had a Protonix 40 mg daily.    I saw him on December 04, 2020 at which time he continued to experience increasing shortness of breath with activity and admitted to being very weak.  He was experiencing episodes of sharp stabbing pain when he was lying down on the left chest region.  He no longer had a fever.  He had not had a follow-up chest x-ray.  He was not sleeping well.  I recommended he undergo a PA and lateral chest x-ray as well as laboratory.  CRP was 1.  Hemoglobin and hematocrit were 12.9 and 39.5.  Creatinine was 1.09.  Erythrocyte sedimentation rate had improved and was now 18 compared to 98 in January and 40 on November 18, 2020.  A chest x-ray January 15, 2021 was negative for acute cardiopulmonary disease.  I scheduled him for cardiac MRI which was done on January 18, 2021 was essentially normal and showed normal LV chamber size with mild increased wall thickness of 12 mm.  LVEF was 60% without regional wall motion  abnormalities.  There was no evidence for myocardial edema.  There was no postcontrast delayed myocardial enhancement.  RV function was normal with an EF of 53%.  Valves were normal.  The pericardium was normal.  His aorta was mildly dilated at the level of the sinus Valsalva measuring 41 mm.  I saw him on January 18, 2021 in follow-up office visit.  At that time he felt better but had not yet returned to work.  However he was still experiencing some pain while at sleeping on his back and reason was sleeping on his left side.  He described the pain as a knifelike sensation is also noted a vague residual dullness.  During that evaluation, I recommended that he can return to work.  I felt his chest pain was nonischemic and chest wall discomfort.  He was still waiting for his machine and I recommended institution of AutoPap therapy.  I saw him as an add-on on Feb 15, 2021.  Since his previous evaluation he had  called the office on January 28, 2021 after he experienced recurrent sharp chest pain episodes after he had had a heated discussion with his supervisor at work.  He had noted that his heart rate increases when he lies on his back even before he falls asleep with heart rates in the upper 90s.  He has tried instituting AutoPap therapy.  However he is only been sleeping several hours with it.  He has been having difficulty with the full facemask that was provided to him which covers the nose and mouth.  He was added onto my schedule for follow-up evaluation.  During that evaluation, his ECG showed sinus rhythm at 72 bpm with previously noted T wave abnormality in leads III and aVF.  He was experiencing knifelike chest pain which seem to be precipitated by [place] and was taking ibuprofen 600 mg twice a day with benefit.  With his recent heart rate increase I suggested a trial of increasing metoprolol succinate from 25 to 50 mg.  I had a long discussion with him regarding his initiation of CPAP therapy.  Apparently,  Nathaniel Jones CPAP machine was picked up by the DME company on August 2 due to very poor compliance and no recent usage.  He was seen by Dr. Lethea Killings on July 30, 2021 complaining of 2 to 3 weeks of intermittent chest tightness more described as a sharp pain on the right which then moved to his left chest.  It also had pleuritic component being worse with deep inspiration.  The time he was very concerned about recurrent pericarditis.  Dr. Bufford Buttner was not convinced this was pericarditis and that may be just pleurisy related to a chest cold.  However sed rate and CRP was obtained and he  was started on therapy with indomethacin 25 mg 3 times a day for 1 week.  Sedimentation rate was normal at 8 and C-reactive protein was increased at 19.  Subsequently, he was reevaluated on August 03, 2021 Azalee Course, [place] after he had called the office complaining of worsening symptoms.  He was complaining of pleuritic chest pain with sharp pain with deep inspiration and laying down which was the same pain that he had experienced with pericarditis.  At that time, he was represcribed colchicine 0.6 mg twice a day and he was wondering about getting an antibiotic but it was felt that most likely he had a nonbacterial etiology and antibiotic was not prescribed.  I saw him as an add-on in August 25, 2021.  He had been  evaluated in urgent care on November 16 due to concerns about worsening chest pain and shortness of breath.  A chest x-ray was obtained and he was started on Levaquin and prednisone for presumed walking pneumonia.  August 24, 2021 he read presented to [facility] ER at [place] with shortness of breath that was waking him up from sleep at night and similar pleuritic-like chest pain.  D-dimer was negative.  White blood count was 16,800, but he was on prednisone.  Glucose had increased to 127.  COVID test was negative.  Troponins were negative.  BNP was normal at 63.4.  His blood pressure has been elevated.  He continues to  experience some twinges of chest pain when he takes a deep breath but actually the symptoms have somewhat improved since initiation of colchicine and he has continued to be on indomethacin 50 mg twice a day.  He was sleeping poorly.   During that evaluation his ECG showed sinus bradycardia 53 bpm.  He had normal PR segment.  On exam he did not have a friction rub.  His blood pressure was elevated and I felt this may have been also contributed by his rem related untreated sleep apnea.  I recommended sodium restriction and added amlodipine 5 mg for improved blood pressure control as well as spironolactone 12.5 mg daily.  I scheduled him for 2D echo Doppler study.  I recommended he continue nonsteroidal anti-inflammatory medicine and colchicine.  I discussed his untreated sleep apnea at length.  He was not a candidate for inspire with his BMI of 39.68.  We discussed reevaluation versus a dental evaluation for a customized oral appliance.  I also recommended repeat laboratory with a comprehensive metabolic panel, CBC, erythrocyte sedimentation rate, and CRP.  When I saw him in follow-up on October 07, 2021 he felt better but still was fatigued.   His echo Doppler study on September 02, 2021 showed an EF of 55 to 60% with normal wall motion.  There was mild biatrial enlargement.  Pericardium was normal without evidence for effusion.  Laboratory revealed a CRP of 2, normal chemistry and sed rate of 7.  CBC was normal with white blood count of 16.8 which had improved from November 22 when it was 16.8.  He feels better.    I saw him on Feb 16, 2022. Over the last several months, he had felt well but 3 weeks ago he had an upper respiratory tract infection.  This was associated with some chest pain in his chest and across his back which seem more prominent when laying down.  He again developed some lethargy and felt fatigued.  He denies any current cough.  He continued to be on colchicine 0.6 mg twice  a day he resumed taking  spironolactone 12.5 mg for the last week.  He continued to be on metoprolol succinate 50 mg daily and amlodipine 5 mg.  He was taking ibuprofen 400 mg.  During that evaluation his ECG was stable and he had a normal PR segment and no ST segment changes.  I recommended repeat laboratory and follow-up echocardiographic evaluation.  Laboratory on Feb 16, 2022 showed a erythrocyte sedimentation rate at 17.  CRP was 2.  There was mild transaminase elevation.  An echo Doppler study from March 10, 2022 showed normal LV function with EF 60 to 65%.  There was grade 2 diastolic dysfunction with mild LVH and moderate LA dilation.  There was mild MR.  There was no evidence for pericardial effusion.  I saw him on November 25, 2022. Nathaniel Jones felt well.  He was recently in the [employer].  He is back exercising.  He has been lifting weights and walking over the past 3 months typically at for 45 minutes to an hour.  He denies any pleuritic chest pain.  There are no fever chills or night sweats.  There is no shortness of breath.  He he has not been successful with weight loss.  He has continued to be on amlodipine 5 mg, metoprolol succinate 50 mg, and still has been taking colchicine 0.6 mg twice a day.  During that evaluation, I felt he was stable suggested weaning of colchicine to 0.6 mg daily for 2 weeks then reduce to every other day for 2 weeks and then discontinue.  I last saw him on September 1924 at which time he continued to do well.  However, he had recently been evaluated in the emergency room May 14, 2023 with complaints of left-sided chest and back discomfort.  His symptoms started approximately 1 week previously.  They were not exertionally precipitated.  At times they were sharp. He had an extensive evaluation.  Laboratory stable with the exception of elevation of ALT at 68.  D-dimer was normal at less than 0.27; troponins were negative.  COVID was negative.  CBC was stable.  [place], he  underwent CT imaging of his abdomen and pelvis which was normal.  During his office visit, he denied any pleuritic chest pain.  He was unaware of palpitations, presyncope or syncope.  He admitted to recent weight gain.  Since I last saw him, he has continued to be stable.  He has had mild elevation of LFTs noted on laboratory over the past year.  Had improved at last check in November but still remains borderline elevated.  Since Dr. Lupe Carney retired at [place] he sees Dr. Lewie Chamber for primary care.  He will be undergoing complete set of laboratory in several weeks.  He has had issues with pain from the ruptured disc in his lower back for which he is seeing Dr. Magnus Ivan of orthopedics.  On November 21, 2023 he underwent an epidural injection to his low back with benefit.  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  Past Medical History:  Diagnosis Date   Arthritis of both knees    Back pain    Chest pain    Constipation    Depression    Food allergy    coconut   Hypertension    Joint pain    Low back pain    herniated disc  Lumbar herniated disc    Obesity    OSA (obstructive sleep apnea)    has not yet received CPAP   Other fatigue    Pericarditis    in his 21s   Prediabetes    Shortness of breath    Shortness of breath on exertion     Past Surgical History:  Procedure Laterality Date   KNEE SURGERY Right 2016    Current Medications: Outpatient Medications Prior to Visit  Medication Sig Dispense Refill   allopurinol (ZYLOPRIM) 100 MG tablet Take 100 mg by mouth daily.     amLODipine (NORVASC) 5 MG tablet TAKE 1 TABLET (5 MG TOTAL) BY MOUTH DAILY. 30 tablet 5   colchicine 0.6 MG tablet Take 1 tablet (0.6 mg total) by mouth daily as needed. 30 tablet 3   metoprolol succinate (TOPROL-XL) 50 MG 24 hr tablet TAKE 1 TABLET BY MOUTH EVERY DAY WITH OR IMMEDIATELY FOLLOWING A MEAL 30  tablet 11   QUEtiapine (SEROQUEL) 25 MG tablet Take 25 mg by mouth at bedtime.     naproxen (NAPROSYN) 500 MG tablet Take 1 tablet (500 mg total) by mouth 2 (two) times daily. 60 tablet 2   pantoprazole (PROTONIX) 40 MG tablet Take 1 tablet (40 mg total) by mouth daily. 30 tablet 10   No facility-administered medications prior to visit.     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   Social Network    Social Network: Not on file    Socially he is single.  He was born in [place] with early high school.  He has a BS degree.  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  He has a child.  Family History:  The patient's family history includes Alcoholism in his father and another family member; Depression in his mother and another family member; Diabetes in his brother, maternal grandmother, mother, and another family member; Heart disease in his father; High  Cholesterol in his maternal grandmother; Hyperlipidemia in his father and mother; Hypertension in his father, mother, and another family member; Obesity in his mother and another family member; Sleep apnea in his mother and another family member; Stroke in his maternal grandfather and maternal grandmother.     ROS General: Negative; No fevers, chills, or night sweats;  HEENT: Negative; No changes  in vision or hearing, sinus congestion, difficulty swallowing Pulmonary: Significant improvement in prior pleuritic chest pain Cardiovascular: See HPI GI: Negative; No nausea, vomiting, diarrhea, or abdominal pain GU: Negative; No dysuria, hematuria, or difficulty voiding Musculoskeletal: Negative; no myalgias, joint pain, or weakness Hematologic/Oncology: Negative; no easy bruising, bleeding Endocrine: Negative; no heat/cold intolerance; no diabetes Neuro: Negative; no changes in balance, headaches Skin: Negative; No rashes or skin lesions Psychiatric: Negative; No behavioral problems, depression Sleep: OSA, noncompliant with CPAP and as result is CPAP machine was picked up by his DME company;  history of snoring, daytime sleepiness, hypersomnolence Other comprehensive 14 point system review is negative.   PHYSICAL EXAM:   VS:  BP 112/76   Pulse 69   Ht 6\' 4"  (1.93 m)   Wt (!) 342 lb 12.8 oz (155.5 kg)   SpO2 96%   BMI 41.73 kg/m     Repeat blood pressure by me was 122/70.  Wt Readings from Last 3 Encounters:  12/11/23 (!) 342 lb 12.8 oz (155.5 kg)  06/22/23 (!) 337 lb 12.8 oz (153.2 kg)  05/14/23 (!) 315 lb (142.9 kg)   General: Alert, oriented, no distress.  Mesomorphic body habitus, muscular Skin: normal turgor, no rashes, warm and dry HEENT: Normocephalic, atraumatic. Pupils equal round and reactive to light; sclera anicteric; extraocular muscles intact; Fundi ** Nose without nasal septal hypertrophy Mouth/Parynx benign; Mallinpatti scale 3 Neck: No JVD, no carotid bruits;  normal carotid upstroke Lungs: clear to ausculatation and percussion; no wheezing or rales Chest wall: without tenderness to palpitation Heart: PMI not displaced, RRR, s1 s2 normal, 1/6 systolic murmur, no diastolic murmur, no rubs, gallops, thrills, or heaves Abdomen: soft, nontender; no hepatosplenomehaly, BS+; abdominal aorta nontender and not dilated by palpation. Back: no CVA tenderness Pulses 2+ Musculoskeletal: full range of motion, normal strength, no joint deformities Extremities: no clubbing cyanosis or edema, Homan's sign negative  Neurologic: grossly nonfocal; Cranial nerves grossly wnl Psychologic: Normal mood and affect     Studies/Labs Reviewed:   EKG Interpretation Date/Time:  Monday December 11 2023 08:31:19 EDT Ventricular Rate:  69 PR Interval:  180 QRS Duration:  94 QT Interval:  392 QTC Calculation: 420 R Axis:   19  Text Interpretation: Normal sinus rhythm Normal ECG When compared with ECG of 22-Jun-2023 10:23, PR interval has decreased Confirmed by Nicki Guadalajara ([id number]) on 12/11/2023 8:46:40 AM    June 22, 2023 ECG (independently read by me): sinus bradycardia at 59, 1st degree AV block  November 25, 2022  ECG (independently read by me): NSR at 62, normal PR segment;  no STT changes  Feb 16, 2022 ECG (independently read by me): Sinus bradycardia 56 bpm.  Normal PR segment.  No significant ST changes.  October 07, 2021 ECG (independently read by me): NSR at 66; PR 182 msec; QTc 429 msec  August 25, 2021 ECG (independently read by me):  Sinus bradycardia at 53, normal PR segment   May 16,2022 ECG (independently read by me): Normal sinus rhythm at 72 bpm.  Previously noted T wave abnormality in lead III and aVF.  January 28, 2021 ECG (independently read by me): NSR at 63; LVH Q III T wave abnormality III, aVF  December 04, 2020 ECG (independently read by me): NSR at 71; mild early repolarization changes; QTc 430  I reviewed his recent ECGs from his emergency  room and hospitalizations.  Early repolarization changes had evolved since his October 28, 2020 ECG.  Recent Labs:    Latest  Ref Rng & Units 08/08/2023    9:28 AM 06/22/2023   12:00 AM 05/14/2023   11:36 PM  BMP  Glucose 70 - 99 mg/dL 96  98  89   BUN 6 - 24 mg/dL 16  9  14    Creatinine 0.76 - 1.27 mg/dL 1.61  0.96  0.45   BUN/Creat Ratio 9 - 20 15  9     Sodium 134 - 144 mmol/L 142  142  141   Potassium 3.5 - 5.2 mmol/L 4.5  4.2  3.8   Chloride 96 - 106 mmol/L 105  106  106   CO2 20 - 29 mmol/L 26  25  27    Calcium 8.7 - 10.2 mg/dL 9.5  9.2  8.9         Latest Ref Rng & Units 08/08/2023    9:28 AM 06/22/2023   12:00 AM 05/14/2023   11:36 PM  Hepatic Function  Total Protein 6.0 - 8.5 g/dL 7.3  7.3  6.9   Albumin 4.1 - 5.1 g/dL 4.2  4.0  3.8   AST 0 - 40 IU/L 41  48  36   ALT 0 - 44 IU/L 54  73  68   Alk Phosphatase 44 - 121 IU/L 86  86  74   Total Bilirubin 0.0 - 1.2 mg/dL 0.6  0.5  0.6        Latest Ref Rng & Units 05/14/2023   11:36 PM 02/16/2022   12:45 PM 12/07/2021    8:50 AM  CBC  WBC 4.0 - 10.5 K/uL 8.4  5.7  8.1   Hemoglobin 13.0 - 17.0 g/dL 40.9  81.1  91.4   Hematocrit 39.0 - 52.0 % 40.5  43.0  44.3   Platelets 150 - 400 K/uL 226  212  202    Lab Results  Component Value Date   MCV 89.4 05/14/2023   MCV 89 02/16/2022   MCV 92 12/07/2021   Lab Results  Component Value Date   TSH 1.700 12/07/2021   Lab Results  Component Value Date   HGBA1C 6.0 (H) 12/07/2021     BNP    Component Value Date/Time   BNP 63.4 08/24/2021 2030    ProBNP No results found for: "PROBNP"   Lipid Panel     Component Value Date/Time   CHOL 152 12/07/2021 0850   TRIG 82 12/07/2021 0850   HDL 62 12/07/2021 0850   LDLCALC 74 12/07/2021 0850   LABVLDL 16 12/07/2021 0850     RADIOLOGY: XR C-ARM NO REPORT Result Date: 11/21/2023 Please see Notes tab for imaging impression.  Epidural Steroid injection Result Date: 11/21/2023 Nathaniel Antonio, MD     11/28/2023 11:43  AM Lumbosacral Transforaminal Epidural Steroid Injection - Sub-Pedicular Approach with Fluoroscopic Guidance Patient: Nathaniel Jones     Date of Birth: Dec 17, 1980 MRN: [id number] PCP: [name], [name], MD (Inactive)     Visit Date: 11/21/2023  Universal Protocol:   Date/Time: 11/21/2023 Consent Given By: the patient Position: PRONE Additional Comments: Vital signs were monitored before and after the procedure. Patient was prepped and draped in the usual sterile fashion. The correct patient, procedure, and site was verified. Injection Procedure Details: Procedure diagnoses: Lumbar radiculopathy [M54.16]  Meds Administered: Meds ordered this encounter Medications  methylPREDNISolone acetate (DEPO-MEDROL) injection 40 mg Laterality: Left Location/Site: L5 Needle:6.0 in., 22 ga.  Short bevel or Quincke spinal needle Needle Placement: Transforaminal Findings:   -Comments: Excellent flow of contrast along the nerve, nerve root  and into the epidural space. Procedure Details: After squaring off the end-plates to get a true AP view, the C-arm was positioned so that an oblique view of the foramen as noted above was visualized. The target area is just inferior to the "nose of the scotty dog" or sub pedicular. The soft tissues overlying this structure were infiltrated with 2-3 ml. of 1% Lidocaine without Epinephrine. The spinal needle was inserted toward the target using a "trajectory" view along the fluoroscope beam.  Under AP and lateral visualization, the needle was advanced so it did not puncture dura and was located close the 6 O'Clock position of the pedical in AP tracterory. Biplanar projections were used to confirm position. Aspiration was confirmed to be negative for CSF and/or blood. A 1-2 ml. volume of Isovue-250 was injected and flow of contrast was noted at each level. Radiographs were obtained for documentation purposes. After attaining the desired flow of contrast documented above, a 0.5 to 1.0 ml test dose of 0.25%  Marcaine was injected into each respective transforaminal space.  The patient was observed for 90 seconds post injection.  After no sensory deficits were reported, and normal lower extremity motor function was noted,   the above injectate was administered so that equal amounts of the injectate were placed at each foramen (level) into the transforaminal epidural space. Additional Comments: The patient tolerated the procedure well Dressing: 2 x 2 sterile gauze and Band-Aid  Post-procedure details: Patient was observed during the procedure. Post-procedure instructions were reviewed. Patient left the clinic in stable condition.     Additional studies/ records that were reviewed today include:  I reviewed his prior office notes, January 2022 hospital records, ECGs, and subsequent evaluation with Bettina Gavia, PA  ER hospital records from August 2024 were reviewed  Recent ER records from [facility] and epidural injection were reviewed.   ASSESSMENT:    1. Essential hypertension   2. COVID-19 virus infection: January 2022   3. Chronic bilateral low back pain without sciatica   4. LFT elevation   5. OSA (obstructive sleep apnea): remote CPAP use   6. Pericarditis, unspecified chronicity, unspecified type: resolved   7. NAFLD (nonalcoholic fatty liver disease)   8. Obesity, Class III, BMI 40-49.9 (morbid obesity) (HCC):: Mesomorphic body habitus     PLAN:  Nathaniel Jones is a 43 year old gentleman who has a history of hypertension, obstructive sleep apnea mostly on CPAP, Covid 19 infection on October 02, 2020 with subsequent sequelae of fever, chills, headache, myalgias, and pneumonia.  Spinal tap was negative.  He was felt to have possible pericarditis and was treated with colchicine and ibuprofen and had taken Relafen without much benefit compared to previous ibuprofen.  When I saw him on December 04, 2020 I recommended he resume ibuprofen with his recurrent symptomatology.  A PA and lateral chest x-ray was  without acute disease.  I also scheduled him for cardiac MRI which essentially was normal.  Subsequent laboratory also showed significantly improved erythrocyte sedimentation rate and C-reactive protein was normal.  He has developed recurrent episodes of knifelike chest discomfort leading to reevaluation in May.  In October 2022 he again experienced recurrent symptomatology and saw Dr. Bufford Buttner and subsequently Azalee Course, PA.  C-reactive protein had elevated to 19 and he was started on indomethacin.  He was started back on colchicine when seen by Azalee Course, PA on August 03, 2021.  He continued to experience recurrent symptomatology.  When I saw him in late November 2022, he  had stage II hypertension and I started him on amlodipine 5 mg in addition to spironolactone.  A subsequent 2D echo Doppler study showed normal systolic function.  Pericardium was normal.  There was mild atrial dilatation.  He has been on ibuprofen most recently at 600 mg every 8 hours in addition to colchicine 0.6 mg twice daily as well as amlodipine 5 mg, metoprolol succinate 50 mg daily.  With this therapy, his inflammation has essentially resolved.  His CRP is 2 and sedimentation rate 7.  At his office visit in January 2023 his blood pressure was stable and I recommended reduction of ibuprofen to 400 mg every 8 hours for 2 days and then reduce this to 200 mg every 8 hours with subsequent as needed.  At his May 2023 evaluation, I recommended laboratory as well as 2D echo Doppler follow-up assessment.  Erythrocyte sedimentation rate was upper normal at 17.  C-reactive protein was 2.  There was mild transaminase elevation felt to be due to nonalcoholic fatty liver disease.  His echo Doppler study showed an EF of 60 to 65% with grade 2 diastolic dysfunction and mild LVH.  There was moderate LA dilatation, mild MR, and no evidence for pericardial effusion.  When seen by me in February 2024 he was doing well.  At that time I recommended ultimate  weaning of colchicine.  He has had some issues with atypical chest pain leading to an ER evaluation in August 2024.  Subsequent CT imaging of abdomen and pelvis was normal.  He has been documented to have minimal transaminase elevation on LFTs which have improved.  At his last office evaluation with me in September 2024 he has remained relatively stable from a cardiovascular standpoint.  He denies any pleuritic-like chest pain.  At times he experiences some occasional chest wall discomfort.  He has a prescription still for colchicine which he takes as needed.  He continues to be on amlodipine 5 mg and metoprolol succinate 50 mg daily.  He is no longer on Naprosyn or and no longer on pantoprazole.  He takes Seroquel at bedtime.  He has had issues with recurrent leg discomfort secondary to ruptured disc in his low back and recently underwent an epidural and injection with benefit.  Continues to exercise regularly but does not lift weights like he had had previously and typically walks on the treadmill or stairmaster.  Blood pressure today is stable.  Weight is up to 342.  Will be undergoing repeat complete fasting laboratory with Dr. Lewie Chamber in the next several weeks at which time will be important to reassess LFTs.  Since he is seeing Dr. Lethea Killings in the past, I again discussed my upcoming retirement.  I will transfer him to the care of Dr. Gillis Santa and arrange for follow-up in 6 to 8 months or sooner as needed.    Medication Adjustments/Labs and Tests Ordered: Current medicines are reviewed at length with the patient today.  Concerns regarding medicines are outlined above.  Medication changes, Labs and Tests ordered today are listed in the Patient Instructions below. Patient Instructions  Medication Instructions:  No medication changes. *If you need a refill on your cardiac medications before your next appointment, please call your pharmacy*   Lab Work: No labs were ordered during today's visit.  If  you have labs (blood work) drawn today and your tests are completely normal, you will receive your results only by: MyChart Message (if you have MyChart) OR A paper copy in the mail  If you have any lab test that is abnormal or we need to change your treatment, we will call you to review the results.   Testing/Procedures: No procedures were ordered during today's visit.    Follow-Up: At [facility], you and your health needs are our priority.  As part of our continuing mission to provide you with exceptional heart care, we have created designated Provider Care Teams.  These Care Teams include your primary Cardiologist (physician) and Advanced Practice Providers (APPs -  Physician Assistants and Nurse Practitioners) who all work together to provide you with the care you need, when you need it.  We recommend signing up for the patient portal called "MyChart".  Sign up information is provided on this After Visit Summary.  MyChart is used to connect with patients for Virtual Visits (Telemedicine).  Patients are able to view lab/test results, encounter notes, upcoming appointments, etc.  Non-urgent messages can be sent to your provider as well.   To learn more about what you can do with MyChart, go to [email or web address].    Your next appointment:   6-49month(s)  Provider:   Dr. Lennie Odor   Other Instructions        1st Floor: - Lobby - Registration  - Pharmacy  - Lab - Cafe   2nd Floor: - PV Lab - Diagnostic Testing (echo, CT, nuclear med)   3rd Floor: - Vacant   4th Floor: - TCTS (cardiothoracic surgery) - AFib Clinic - Structural Heart Clinic - Vascular Surgery  - Vascular Ultrasound   5th Floor: - HeartCare Cardiology (general and EP) - Clinical Pharmacy for coumadin, hypertension, lipid, weight-loss medications, and med management appointments      Valet parking services will be available as well.       Thank you for choosing [facility]!       Signed, Nicki Guadalajara, MD  12/11/2023 9:27 AM    [facility] [address], [place], [place]  [address] Phone: [phone]

## 2023-12-19 ENCOUNTER — Ambulatory Visit: Admitting: Podiatry

## 2024-01-02 ENCOUNTER — Ambulatory Visit: Admitting: Podiatry

## 2024-01-02 ENCOUNTER — Encounter: Payer: Self-pay | Admitting: Podiatry

## 2024-01-02 ENCOUNTER — Ambulatory Visit (INDEPENDENT_AMBULATORY_CARE_PROVIDER_SITE_OTHER)

## 2024-01-02 DIAGNOSIS — M7752 Other enthesopathy of left foot: Secondary | ICD-10-CM

## 2024-01-02 DIAGNOSIS — M79675 Pain in left toe(s): Secondary | ICD-10-CM

## 2024-01-02 DIAGNOSIS — B351 Tinea unguium: Secondary | ICD-10-CM | POA: Diagnosis not present

## 2024-01-03 NOTE — Progress Notes (Signed)
  Subjective:  Patient ID: Nathaniel Jones, male    DOB: 09/21/1981,  MRN: [id number]  Chief Complaint  Patient presents with   Toe Pain    Patient states he was at practice one day and seen that there was a line in his left foot hallux, no medication for pain    43 y.o. male presents with the above complaint. History confirmed with patient.   Objective:  Physical Exam: warm, good capillary refill, no trophic changes or ulcerative lesions, normal DP and PT pulses, normal sensory exam, and no pain in MTP or IPJ or on the bony portions of the great toe there is onychomycosis with a linear streak in the nail plate.    Radiographs: Multiple views x-ray of the left foot: no fracture, dislocation, swelling or degenerative changes noted Assessment:   1. Capsulitis of toe of left foot   2. Onychomycosis      Plan:  Patient was evaluated and treated and all questions answered.  X-ray negative for fracture or arthritic changes.  Most of the pain likely is from the onychomycosis.  We discussed treatment of this including oral therapy with Lamisil.  He had recent elevations in his LFTs and goes for follow-up next week for follow-up on his progress and he will have a copy of the lab work sent to me.  Should still be a candidate for Lamisil with close monitoring we will prescribe once I have his lab work in hand and if the elevated may consider pulsed dosing of Diflucan which should not need continued monitoring.  No follow-ups on file.

## 2024-01-05 LAB — LAB REPORT - SCANNED
A1c: 6.3
EGFR: 78
HM Hepatitis Screen: NEGATIVE

## 2024-01-15 ENCOUNTER — Telehealth: Payer: Self-pay | Admitting: Podiatry

## 2024-01-15 NOTE — Telephone Encounter (Signed)
 Patient is asking if blood work came back so he can start the medication that was recommended to him.  Please call patient.

## 2024-01-17 NOTE — Telephone Encounter (Signed)
 I called and left a message for the patient to call back and see if he got labs at another office. Waiting on call back.

## 2024-01-17 NOTE — Telephone Encounter (Signed)
 I called back and he is going to reach out to his PCP and have them fax a copy. I let him know that once the provider reviews the labs then he can let him know about starting the medication. He voices understanding and no further questions.

## 2024-02-22 ENCOUNTER — Telehealth: Payer: Self-pay | Admitting: Podiatry

## 2024-02-22 NOTE — Telephone Encounter (Signed)
 Patient walked into the office today, he is still waiting on you to review the labs that were sent to us  on 01/09/2024. He is waiting on you to review the labs so that way you can put in the prescription(Lamisil) that you mentioned to him on his last visit with you on 01/02/2024. He has no follow up visit scheduled. He is just waiting on the prescription.

## 2024-02-23 ENCOUNTER — Other Ambulatory Visit: Payer: Self-pay | Admitting: Podiatry

## 2024-02-23 MED ORDER — TERBINAFINE HCL 250 MG PO TABS: 250.0000 mg | ORAL_TABLET | Freq: Every day | ORAL | 2 refills | Status: DC

## 2024-02-23 NOTE — Progress Notes (Signed)
 Terbinafine sent after labs reviewed

## 2024-04-16 ENCOUNTER — Other Ambulatory Visit: Payer: Self-pay | Admitting: Cardiovascular Disease

## 2024-04-16 DIAGNOSIS — I10 Essential (primary) hypertension: Secondary | ICD-10-CM

## 2024-04-16 DIAGNOSIS — G4733 Obstructive sleep apnea (adult) (pediatric): Secondary | ICD-10-CM

## 2024-05-22 ENCOUNTER — Other Ambulatory Visit: Payer: Self-pay | Admitting: General Practice

## 2024-05-24 MED ORDER — METOPROLOL SUCCINATE ER 50 MG PO TB24: 50.0000 mg | ORAL_TABLET | Freq: Every day | ORAL | 3 refills | Status: AC

## 2024-05-25 ENCOUNTER — Emergency Department (HOSPITAL_BASED_OUTPATIENT_CLINIC_OR_DEPARTMENT_OTHER)

## 2024-05-25 ENCOUNTER — Other Ambulatory Visit: Payer: Self-pay

## 2024-05-25 ENCOUNTER — Emergency Department (HOSPITAL_BASED_OUTPATIENT_CLINIC_OR_DEPARTMENT_OTHER)
Admission: EM | Admit: 2024-05-25 | Discharge: 2024-05-25 | Disposition: A | Discharge status: Home / Self Care | Attending: Emergency Medicine | Admitting: Emergency Medicine

## 2024-05-25 DIAGNOSIS — Z79899 Other long term (current) drug therapy: Secondary | ICD-10-CM | POA: Diagnosis not present

## 2024-05-25 DIAGNOSIS — R3989 Other symptoms and signs involving the genitourinary system: Secondary | ICD-10-CM | POA: Insufficient documentation

## 2024-05-25 DIAGNOSIS — I10 Essential (primary) hypertension: Secondary | ICD-10-CM | POA: Diagnosis not present

## 2024-05-25 DIAGNOSIS — R1032 Left lower quadrant pain: Secondary | ICD-10-CM | POA: Insufficient documentation

## 2024-05-25 LAB — URINALYSIS, ROUTINE W REFLEX MICROSCOPIC
Bilirubin Urine: NEGATIVE
Glucose, UA: NEGATIVE mg/dL
Hgb urine dipstick: NEGATIVE
Ketones, ur: NEGATIVE mg/dL
Leukocytes,Ua: NEGATIVE
Nitrite: NEGATIVE
Specific Gravity, Urine: 1.035 — ABNORMAL HIGH (ref 1.005–1.030)
pH: 6 (ref 5.0–8.0)

## 2024-05-25 LAB — COMPREHENSIVE METABOLIC PANEL WITH GFR
ALT: 124 U/L — ABNORMAL HIGH (ref 0–44)
AST: 88 U/L — ABNORMAL HIGH (ref 15–41)
Albumin: 4.2 g/dL (ref 3.5–5.0)
Alkaline Phosphatase: 79 U/L (ref 38–126)
Anion gap: 11 (ref 5–15)
BUN: 11 mg/dL (ref 6–20)
CO2: 25 mmol/L (ref 22–32)
Calcium: 9.2 mg/dL (ref 8.9–10.3)
Chloride: 107 mmol/L (ref 98–111)
Creatinine, Ser: 1.1 mg/dL (ref 0.61–1.24)
GFR, Estimated: >60 mL/min (ref >60)
Glucose, Bld: 98 mg/dL (ref 70–99)
Potassium: 4.2 mmol/L (ref 3.5–5.1)
Sodium: 142 mmol/L (ref 135–145)
Total Bilirubin: 0.7 mg/dL (ref 0.0–1.2)
Total Protein: 7.3 g/dL (ref 6.5–8.1)

## 2024-05-25 LAB — CBC
HCT: 42.3 % (ref 39.0–52.0)
Hemoglobin: 14.2 g/dL (ref 13.0–17.0)
MCH: 30.1 pg (ref 26.0–34.0)
MCHC: 33.6 g/dL (ref 30.0–36.0)
MCV: 89.6 fL (ref 80.0–100.0)
Platelets: 220 K/uL (ref 150–400)
RBC: 4.72 MIL/uL (ref 4.22–5.81)
RDW: 13.2 % (ref 11.5–15.5)
WBC: 7.2 K/uL (ref 4.0–10.5)
nRBC: 0 % (ref 0.0–0.2)

## 2024-05-25 LAB — LIPASE, BLOOD: Lipase: 15 U/L (ref 11–51)

## 2024-05-25 MED ORDER — IOHEXOL 300 MG/ML  SOLN
125.0000 mL | Freq: Once | INTRAMUSCULAR | Status: AC | PRN
  Administered 2024-05-25: 125 mL via INTRAVENOUS

## 2024-05-25 MED ORDER — PHENAZOPYRIDINE HCL 200 MG PO TABS: 200.0000 mg | ORAL_TABLET | Freq: Three times a day (TID) | ORAL | 0 refills | Status: AC

## 2024-05-25 MED ORDER — LACTATED RINGERS IV BOLUS
1000.0000 mL | Freq: Once | INTRAVENOUS | Status: AC
  Administered 2024-05-25: 1000 mL via INTRAVENOUS

## 2024-05-25 NOTE — ED Notes (Signed)
 Patient transported to CT

## 2024-05-25 NOTE — ED Provider Notes (Signed)
 [place] EMERGENCY DEPARTMENT AT [facility] Provider Note   CSN: [id number] Arrival date & time: 05/25/24  1825     Patient presents with: Abdominal Pain   Nathaniel Jones is a 43 y.o. male.  {Add pertinent medical, surgical, social history, OB history to HPI:32947} HPI      43 year old male with a history of hypertension, OSA, pericarditis, who presents with concern for left lower quadrant abdominal pain  1.5 weeks ago went to urgent care LLQ abdominal pain, difficulty urinating Constipation, last BM last night, small amount, had to strain-since starting sleep aid Having urinary frequency and urgency  Lower abdominal pain, not feeling like emptying all the way No fever No nausea or vomiting No blood in urine No penile discharge, did have cloudy urine No hx of kidney stones Not sexually active No hx of surgery    Past Medical History:  Diagnosis Date   Arthritis of both knees    Back pain    Chest pain    Constipation    Depression    Food allergy    coconut   Hypertension    Joint pain    Low back pain    herniated disc   Lumbar herniated disc    Obesity    OSA (obstructive sleep apnea)    has not yet received CPAP   Other fatigue    Pericarditis    in his 1s   Prediabetes    Shortness of breath    Shortness of breath on exertion     Prior to Admission medications   Medication Sig Start Date End Date Taking? Authorizing Provider  allopurinol  (ZYLOPRIM ) 100 MG tablet Take 100 mg by mouth daily. 06/05/20   [provider]  amLODipine  (NORVASC ) 5 MG tablet TAKE 1 TABLET (5 MG TOTAL) BY MOUTH DAILY. 04/17/24   Burnard Debby LABOR, MD  colchicine  0.6 MG tablet Take 1 tablet (0.6 mg total) by mouth daily as needed. 06/22/23   Burnard Debby LABOR, MD  metoprolol  succinate (TOPROL -XL) 50 MG 24 hr tablet Take 1 tablet (50 mg total) by mouth daily. Take with or immediately following a meal. 05/24/24   Cleaver, Josefa HERO, NP  QUEtiapine  (SEROQUEL ) 25 MG  tablet Take 25 mg by mouth at bedtime.    [provider]    Allergies: Coconut (cocos nucifera)    Review of Systems  Updated Vital Signs Temp 98.4 F (36.9 C) (Oral)   Ht 6' 4 (1.93 m)   Wt (!) 156.9 kg   BMI 42.12 kg/m   Physical Exam  (all labs ordered are listed, but only abnormal results are displayed) Labs Reviewed  LIPASE, BLOOD  COMPREHENSIVE METABOLIC PANEL WITH GFR  CBC  URINALYSIS, ROUTINE W REFLEX MICROSCOPIC    EKG: None  Radiology: No results found.  {Document cardiac monitor, telemetry assessment procedure when appropriate:32947} Procedures   Medications Ordered in the ED - No data to display    {Click here for ABCD2, HEART and other calculators REFRESH Note before signing:1}                              Medical Decision Making Amount and/or Complexity of Data Reviewed Labs: ordered.   ***  {Document critical care time when appropriate  Document review of labs and clinical decision tools ie CHADS2VASC2, etc  Document your independent review of radiology images and any outside records  Document your discussion with family members,  caretakers and with consultants  Document social determinants of health affecting pt's care  Document your decision making why or why not admission, treatments were needed:32947:::1}   Final diagnoses:  None    ED Discharge Orders     None

## 2024-05-25 NOTE — ED Triage Notes (Signed)
 Pt POV reporting LLQ pain that radiates down into groin, also reporting cloudy urine and increased frequency. Seen at [facility], advised to come to ED for further evaluation.

## 2024-05-26 ENCOUNTER — Other Ambulatory Visit: Payer: Self-pay | Admitting: Podiatry

## 2024-05-27 LAB — GC/CHLAMYDIA PROBE AMP (~~LOC~~) NOT AT ARMC
Chlamydia: NEGATIVE
Comment: NEGATIVE
Comment: NORMAL
Neisseria Gonorrhea: NEGATIVE

## 2024-08-05 ENCOUNTER — Encounter: Payer: Self-pay | Admitting: Radiology

## 2024-09-11 ENCOUNTER — Other Ambulatory Visit: Payer: Self-pay | Admitting: Podiatry

## 2024-09-16 ENCOUNTER — Encounter (HOSPITAL_BASED_OUTPATIENT_CLINIC_OR_DEPARTMENT_OTHER): Payer: Self-pay | Admitting: Emergency Medicine

## 2024-09-16 ENCOUNTER — Emergency Department (HOSPITAL_BASED_OUTPATIENT_CLINIC_OR_DEPARTMENT_OTHER)

## 2024-09-16 ENCOUNTER — Other Ambulatory Visit: Payer: Self-pay

## 2024-09-16 ENCOUNTER — Emergency Department (HOSPITAL_BASED_OUTPATIENT_CLINIC_OR_DEPARTMENT_OTHER)
Admission: EM | Admit: 2024-09-16 | Discharge: 2024-09-16 | Disposition: A | Discharge status: Home / Self Care | Attending: Emergency Medicine | Admitting: Emergency Medicine

## 2024-09-16 DIAGNOSIS — Z79899 Other long term (current) drug therapy: Secondary | ICD-10-CM | POA: Diagnosis not present

## 2024-09-16 DIAGNOSIS — X58XXXA Exposure to other specified factors, initial encounter: Secondary | ICD-10-CM | POA: Diagnosis not present

## 2024-09-16 DIAGNOSIS — I10 Essential (primary) hypertension: Secondary | ICD-10-CM | POA: Insufficient documentation

## 2024-09-16 DIAGNOSIS — S39012A Strain of muscle, fascia and tendon of lower back, initial encounter: Secondary | ICD-10-CM | POA: Insufficient documentation

## 2024-09-16 DIAGNOSIS — R10A1 Flank pain, right side: Secondary | ICD-10-CM | POA: Diagnosis not present

## 2024-09-16 DIAGNOSIS — S3992XA Unspecified injury of lower back, initial encounter: Secondary | ICD-10-CM | POA: Diagnosis present

## 2024-09-16 LAB — URINALYSIS, ROUTINE W REFLEX MICROSCOPIC
Bilirubin Urine: NEGATIVE
Glucose, UA: NEGATIVE mg/dL
Hgb urine dipstick: NEGATIVE
Ketones, ur: NEGATIVE mg/dL
Leukocytes,Ua: NEGATIVE
Nitrite: NEGATIVE
Protein, ur: NEGATIVE mg/dL
Specific Gravity, Urine: 1.012 (ref 1.005–1.030)
pH: 6 (ref 5.0–8.0)

## 2024-09-16 LAB — COMPREHENSIVE METABOLIC PANEL WITH GFR
ALT: 112 U/L — ABNORMAL HIGH (ref 0–44)
AST: 75 U/L — ABNORMAL HIGH (ref 15–41)
Albumin: 4.3 g/dL (ref 3.5–5.0)
Alkaline Phosphatase: 82 U/L (ref 38–126)
Anion gap: 7 (ref 5–15)
BUN: 9 mg/dL (ref 6–20)
CO2: 29 mmol/L (ref 22–32)
Calcium: 9.9 mg/dL (ref 8.9–10.3)
Chloride: 103 mmol/L (ref 98–111)
Creatinine, Ser: 1.02 mg/dL (ref 0.61–1.24)
GFR, Estimated: >60 mL/min (ref >60)
Glucose, Bld: 96 mg/dL (ref 70–99)
Potassium: 4.5 mmol/L (ref 3.5–5.1)
Sodium: 139 mmol/L (ref 135–145)
Total Bilirubin: 0.8 mg/dL (ref 0.0–1.2)
Total Protein: 7.7 g/dL (ref 6.5–8.1)

## 2024-09-16 LAB — CBC
HCT: 42.3 % (ref 39.0–52.0)
Hemoglobin: 14.6 g/dL (ref 13.0–17.0)
MCH: 30.2 pg (ref 26.0–34.0)
MCHC: 34.5 g/dL (ref 30.0–36.0)
MCV: 87.4 fL (ref 80.0–100.0)
Platelets: 225 K/uL (ref 150–400)
RBC: 4.84 MIL/uL (ref 4.22–5.81)
RDW: 12.9 % (ref 11.5–15.5)
WBC: 5.8 K/uL (ref 4.0–10.5)
nRBC: 0 % (ref 0.0–0.2)

## 2024-09-16 LAB — LIPASE, BLOOD: Lipase: 12 U/L (ref 11–51)

## 2024-09-16 MED ORDER — LIDOCAINE 5 % EX PTCH: 1.0000 | MEDICATED_PATCH | CUTANEOUS | 0 refills | Status: AC

## 2024-09-16 MED ORDER — IOHEXOL 300 MG/ML  SOLN
100.0000 mL | Freq: Once | INTRAMUSCULAR | Status: AC | PRN
  Administered 2024-09-16: 12:00:00 100 mL via INTRAVENOUS

## 2024-09-16 MED ORDER — METHOCARBAMOL 500 MG PO TABS: 500.0000 mg | ORAL_TABLET | Freq: Two times a day (BID) | ORAL | 0 refills | Status: AC

## 2024-09-16 MED ORDER — BISACODYL 5 MG PO TBEC
10.0000 mg | DELAYED_RELEASE_TABLET | Freq: Once | ORAL | Status: AC
  Administered 2024-09-16: 11:00:00 10 mg via ORAL
  Filled 2024-09-16: qty 2

## 2024-09-16 NOTE — ED Notes (Signed)
 DC paperwork given and verbally understood.

## 2024-09-16 NOTE — ED Triage Notes (Signed)
 Pt caox4 ambulatory c/o R flank pain x2 days. Pt also reports less BM than normal, LBM last night, but wasn't much.

## 2024-09-16 NOTE — Discharge Instructions (Signed)
 Please use Tylenol  or ibuprofen  for pain.  You may use 600 mg ibuprofen  every 6 hours or 1000 mg of Tylenol  every 6 hours.  You may choose to alternate between the 2.  This would be most effective.  Not to exceed 4 g of Tylenol  within 24 hours.  Not to exceed 3200 mg ibuprofen  24 hours.  You can use the muscle relaxant I am prescribing in addition to the above to help with any breakthrough pain.  You can take it up to twice daily.  It is safe to take at night, but I would be cautious taking it during the day as it can cause some drowsiness.  Make sure that you are feeling awake and alert before you get behind the wheel of a car or operate a motor vehicle.  It is not a narcotic pain medication so you are able to take it if it is not making you drowsy and still pilot a vehicle or machinery safely.  You can use the lidocaine  patches up to every 12 hours directly where it hurts.

## 2024-09-16 NOTE — ED Provider Notes (Signed)
 [place] EMERGENCY DEPARTMENT AT [facility] Provider Note   CSN: [id number] Arrival date & time: 09/16/24  9165     Patient presents with: Flank Pain   Nathaniel Jones is a 43 y.o. male with past medical history significant for hypertension, obstructive sleep apnea, low back pain, arthritis, intermittent constipation who presents concern for right flank pain for the last 2 days, decreased bowel movements, decreased gas for the last 2 days.  He has been taking Gas-X at home without significant relief.  He has not been taking anything for constipation directly.  No tenesmus.    Flank Pain       Prior to Admission medications  Medication Sig Start Date End Date Taking? Authorizing Provider  lidocaine  (LIDODERM ) 5 % Place 1 patch onto the skin daily. Remove & Discard patch within 12 hours or as directed by MD 09/16/24  Yes Braedyn Riggle H, PA-C  methocarbamol  (ROBAXIN ) 500 MG tablet Take 1 tablet (500 mg total) by mouth 2 (two) times daily. 09/16/24  Yes Khyson Sebesta H, PA-C  allopurinol  (ZYLOPRIM ) 100 MG tablet Take 100 mg by mouth daily. 06/05/20   [provider]  amLODipine  (NORVASC ) 5 MG tablet TAKE 1 TABLET (5 MG TOTAL) BY MOUTH DAILY. 04/17/24   Burnard Debby LABOR, MD  colchicine  0.6 MG tablet Take 1 tablet (0.6 mg total) by mouth daily as needed. 06/22/23   Burnard Debby LABOR, MD  metoprolol  succinate (TOPROL -XL) 50 MG 24 hr tablet Take 1 tablet (50 mg total) by mouth daily. Take with or immediately following a meal. 05/24/24   Cleaver, Josefa HERO, NP  phenazopyridine  (PYRIDIUM ) 200 MG tablet Take 1 tablet (200 mg total) by mouth 3 (three) times daily. 05/25/24   Dreama Longs, MD  QUEtiapine  (SEROQUEL ) 25 MG tablet Take 25 mg by mouth at bedtime.    [provider]  terbinafine  (LAMISIL ) 250 MG tablet TAKE 1 TABLET BY MOUTH EVERY DAY 09/11/24   Standiford, Marsa FALCON, DPM    Allergies: Coconut (cocos nucifera)    Review of Systems   Genitourinary:  Positive for flank pain.  All other systems reviewed and are negative.   Updated Vital Signs BP 133/86 (BP Location: Left Arm)   Pulse 62   Temp 97.7 F (36.5 C) (Oral)   Resp 16   Ht 6' 4 (1.93 m)   Wt (!) 154.2 kg   SpO2 98%   BMI 41.39 kg/m   Physical Exam Vitals and nursing note reviewed.  Constitutional:      General: He is not in acute distress.    Appearance: Normal appearance.  HENT:     Head: Normocephalic and atraumatic.  Eyes:     General:        Right eye: No discharge.        Left eye: No discharge.  Cardiovascular:     Rate and Rhythm: Normal rate and regular rhythm.     Heart sounds: No murmur heard.    No friction rub. No gallop.  Pulmonary:     Effort: Pulmonary effort is normal.     Breath sounds: Normal breath sounds.  Abdominal:     General: Bowel sounds are normal.     Palpations: Abdomen is soft.     Comments: No significant anterior abdominal tenderness, no rebound, rigidity, guarding  Musculoskeletal:     Comments: Some tenderness to palpation in the right flank, no CVA tenderness, right flank pain reproducible in the lumbar paraspinous muscles  Skin:  General: Skin is warm and dry.     Capillary Refill: Capillary refill takes less than 2 seconds.  Neurological:     Mental Status: He is alert and oriented to person, place, and time.  Psychiatric:        Mood and Affect: Mood normal.        Behavior: Behavior normal.     (all labs ordered are listed, but only abnormal results are displayed) Labs Reviewed  COMPREHENSIVE METABOLIC PANEL WITH GFR - Abnormal; Notable for the following components:      Result Value   AST 75 (*)    ALT 112 (*)    All other components within normal limits  URINALYSIS, ROUTINE W REFLEX MICROSCOPIC  CBC  LIPASE, BLOOD    EKG: None  Radiology: CT ABDOMEN PELVIS W CONTRAST Result Date: 09/16/2024 EXAM: CT ABDOMEN AND PELVIS WITH CONTRAST 09/16/2024 11:41:04 AM TECHNIQUE: CT of the  abdomen and pelvis was performed with the administration of 100 mL of iohexol  (OMNIPAQUE ) 300 MG/ML solution. Multiplanar reformatted images are provided for review. Automated exposure control, iterative reconstruction, and/or weight-based adjustment of the mA/kV was utilized to reduce the radiation dose to as low as reasonably achievable. COMPARISON: CT abdomen and pelvis 05/25/2024. CLINICAL HISTORY: 43 year old male with 2 days of right flank pain. FINDINGS: LOWER CHEST: Visible lower chest is negative. LIVER: Evidence of chronic hepatic steatosis. GALLBLADDER AND BILE DUCTS: Gallbladder is unremarkable. No biliary ductal dilatation. SPLEEN: No acute abnormality. PANCREAS: No acute abnormality. ADRENAL GLANDS: No acute abnormality. KIDNEYS, URETERS AND BLADDER: Renal enhancement appears symmetric and normal. No stones in the kidneys or ureters. No hydronephrosis. No perinephric or periureteral stranding. Urinary bladder is unremarkable. GI AND BOWEL: Stomach is decompressed. Decompressed terminal ileum and nondilated small bowel. Mild to moderate large bowel retained stool throughout the colon. Appendix chronically tracks medial from the cecum, on coronal image 71 today. There is a small appendicolith near the tip of the appendix, but the appendix remains nondilated and there is no regional inflammation. There is no bowel obstruction. PERITONEUM AND RETROPERITONEUM: No ascites. No free air. VASCULATURE: Major arterial structures and the portal venous system in the abdomen and pelvis appear patent and normal. Aorta is normal in caliber. LYMPH NODES: No lymphadenopathy. REPRODUCTIVE ORGANS: No acute abnormality. 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 Some chronic degeneration at the pubic symphysis. Punctate pelvic phleboliths are chronic and stable. No acute osseous abnormality. No focal soft tissue abnormality. IMPRESSION: 1. No acute or inflammatory process identified in  the abdomen or pelvis. 2. Appendicolith, but no evidence of acute appendicitis appendicitis. Electronically signed by: Helayne Hurst MD 09/16/2024 12:03 PM EST RP Workstation: [id number]     Procedures   Medications Ordered in the ED  bisacodyl  (DULCOLAX) EC tablet 10 mg (10 mg Oral Given 09/16/24 1037)  iohexol  (OMNIPAQUE ) 300 MG/ML solution 100 mL (100 mLs Intravenous Contrast Given 09/16/24 1133)                                    Medical Decision Making Amount and/or Complexity of Data Reviewed Labs: ordered. Radiology: ordered.  Risk OTC drugs.   This patient is a 43 y.o. male  who presents to the ED for concern of flank pain.   Differential diagnoses prior to evaluation: The emergent differential diagnosis includes, but is not limited to,  AAA, renal vascular thrombosis, mesenteric ischemia, pyelonephritis, nephrolithiasis,  cystitis, biliary colic, pancreatitis, PUD, appendicitis, diverticulitis, bowel obstruction, musculoskeletal back pain, lumbar strain, sciatic also considered . This is not an exhaustive differential.   Past Medical History / Co-morbidities / Social History:  hypertension, obstructive sleep apnea, low back pain, arthritis, intermittent constipation  Physical Exam: Physical exam performed. The pertinent findings include: Some tenderness to palpation in the right flank, no CVA tenderness, right flank pain reproducible in the lumbar paraspinous muscles   No significant anterior abdominal tenderness, no rebound, rigidity, guarding   Lab Tests/Imaging studies: I personally interpreted labs/imaging and the pertinent results include: CBC unremarkable, CMP overall unremarkable other than mildly elevated AST, ALT, lipase unremarkable, UA unremarkable.  CT abdomen pelvis with contrast does show appendicolith but no evidence of acute appendicitis.  Does have some degenerative lumbar spine changes noted. I agree with the radiologist interpretation.   Medications: I  ordered medication including Debrox for constipation.  I have reviewed the patients home medicines and have made adjustments as needed.   Disposition: After consideration of the diagnostic results and the patients response to treatment, I feel that with no acute intra-abdominal findings identified discussed with patient that I feel that symptoms are more probably coming from his lumbar paraspinous muscles, encouraged ibuprofen , Tylenol , Robaxin , Ortho follow-up as needed.   emergency department workup does not suggest an emergent condition requiring admission or immediate intervention beyond what has been performed at this time. The plan is: as above. The patient is safe for discharge and has been instructed to return immediately for worsening symptoms, change in symptoms or any other concerns.  Final diagnoses:  Strain of lumbar region, initial encounter    ED Discharge Orders          Ordered    methocarbamol  (ROBAXIN ) 500 MG tablet  2 times daily        09/16/24 1217    lidocaine  (LIDODERM ) 5 %  Every 24 hours        09/16/24 1217               Benen Weida, Sherlean DEL, PA-C 09/16/24 1220    Bari Roxie HERO, DO 09/16/24 1511
